# Patient Record
Sex: Female | Born: 1955 | Race: White | Hispanic: No | Smoking: Former smoker
Health system: Southern US, Community
[De-identification: ages and names within clinical notes are randomized; demographics above are authoritative.]

## PROBLEM LIST (undated history)

## (undated) DIAGNOSIS — M1990 Unspecified osteoarthritis, unspecified site: Secondary | ICD-10-CM

## (undated) DIAGNOSIS — Z720 Tobacco use: Secondary | ICD-10-CM

## (undated) DIAGNOSIS — T8859XA Other complications of anesthesia, initial encounter: Secondary | ICD-10-CM

## (undated) DIAGNOSIS — I10 Essential (primary) hypertension: Secondary | ICD-10-CM

## (undated) DIAGNOSIS — Z8489 Family history of other specified conditions: Secondary | ICD-10-CM

## (undated) DIAGNOSIS — R011 Cardiac murmur, unspecified: Secondary | ICD-10-CM

## (undated) DIAGNOSIS — R112 Nausea with vomiting, unspecified: Secondary | ICD-10-CM

## (undated) DIAGNOSIS — G4733 Obstructive sleep apnea (adult) (pediatric): Secondary | ICD-10-CM

## (undated) DIAGNOSIS — M549 Dorsalgia, unspecified: Secondary | ICD-10-CM

## (undated) DIAGNOSIS — Z85850 Personal history of malignant neoplasm of thyroid: Secondary | ICD-10-CM

## (undated) DIAGNOSIS — I272 Pulmonary hypertension, unspecified: Secondary | ICD-10-CM

## (undated) DIAGNOSIS — T4145XA Adverse effect of unspecified anesthetic, initial encounter: Secondary | ICD-10-CM

## (undated) DIAGNOSIS — Z9289 Personal history of other medical treatment: Secondary | ICD-10-CM

## (undated) DIAGNOSIS — G8929 Other chronic pain: Secondary | ICD-10-CM

## (undated) DIAGNOSIS — IMO0001 Reserved for inherently not codable concepts without codable children: Secondary | ICD-10-CM

## (undated) DIAGNOSIS — E669 Obesity, unspecified: Secondary | ICD-10-CM

## (undated) DIAGNOSIS — E782 Mixed hyperlipidemia: Secondary | ICD-10-CM

## (undated) DIAGNOSIS — E039 Hypothyroidism, unspecified: Secondary | ICD-10-CM

## (undated) DIAGNOSIS — C801 Malignant (primary) neoplasm, unspecified: Secondary | ICD-10-CM

## (undated) DIAGNOSIS — I2510 Atherosclerotic heart disease of native coronary artery without angina pectoris: Secondary | ICD-10-CM

## (undated) DIAGNOSIS — K219 Gastro-esophageal reflux disease without esophagitis: Secondary | ICD-10-CM

## (undated) DIAGNOSIS — Z9889 Other specified postprocedural states: Secondary | ICD-10-CM

## (undated) DIAGNOSIS — E785 Hyperlipidemia, unspecified: Secondary | ICD-10-CM

## (undated) HISTORY — DX: Personal history of malignant neoplasm of thyroid: Z85.850

## (undated) HISTORY — DX: Obesity, unspecified: E66.9

## (undated) HISTORY — DX: Essential (primary) hypertension: I10

## (undated) HISTORY — DX: Obstructive sleep apnea (adult) (pediatric): G47.33

## (undated) HISTORY — DX: Other chronic pain: G89.29

## (undated) HISTORY — DX: Atherosclerotic heart disease of native coronary artery without angina pectoris: I25.10

## (undated) HISTORY — DX: Dorsalgia, unspecified: M54.9

## (undated) HISTORY — DX: Hyperlipidemia, unspecified: E78.5

## (undated) HISTORY — DX: Tobacco use: Z72.0

## (undated) HISTORY — DX: Mixed hyperlipidemia: E78.2

## (undated) HISTORY — DX: Pulmonary hypertension, unspecified: I27.20

---

## 1997-11-02 HISTORY — PX: ABDOMINAL HYSTERECTOMY: SHX81

## 1998-11-02 HISTORY — PX: THYROIDECTOMY: SHX17

## 2010-11-02 DIAGNOSIS — I2510 Atherosclerotic heart disease of native coronary artery without angina pectoris: Secondary | ICD-10-CM

## 2010-11-02 HISTORY — DX: Atherosclerotic heart disease of native coronary artery without angina pectoris: I25.10

## 2010-11-24 ENCOUNTER — Ambulatory Visit
Admission: RE | Admit: 2010-11-24 | Discharge: 2010-11-24 | Payer: Self-pay | Source: Home / Self Care | Attending: Cardiovascular Disease | Admitting: Cardiovascular Disease

## 2010-11-24 NOTE — Letter (Signed)
November 24, 2010   Kathleen Rossetti, MD [address] [place], [place] [address]  RE:  ARNOLD, DEPINTO MRN:  [id number]  /  DOB:  07-20-1956  Dear Dr. Shary Decamp:  Thank you for referring Ms. Perras for further cardiac evaluation.  As you are aware, this is a pleasant 55 year old female with the following problem list: 1. Hypertension. 2. Hyperlipidemia. 3. Chronic back pain due to degenerative disk disease and herniated     disk. 4. History of thyroid cancer, status post thyroidectomy followed by     radiation therapy in 1998. 5. Obesity. 6. Family history of premature coronary artery disease. 7. Tobacco use. 8. Possible sleep apnea.  HISTORY OF PRESENT ILLNESS:  Ms. Freeburg is here today for evaluation of atypical chest pain and an abnormal ECG.  The patient had uncontrolled hypertension for at least 3-4 years.  Her blood pressure occasionally runs as high as 240 systolic.  For the most part, she is asymptomatic in that regard.  She is currently being treated with lisinopril.  She was recently started on hydrochlorothiazide, but she has not started taking the medication yet.  She is here today for evaluation of atypical chest pain.  She describes on and off symptoms of left-sided and substernal dull sensation, which lasts for a few minutes usually.  It does not have any triggering factors.  It happens mostly at rest with radiation to her neck.  It does not seem to be associated with activities.  She had one episode on Saturday, which lasted 10 minutes. She did not have any associated symptoms with it.  There was no dyspnea or nausea.  She has been trying to exercise on the treadmill about 8 minutes at a time, but she is limited somewhat due to her back discomfort.  She recently started paying more attention to healthier diet.  She had her labs checked and was found to have hyperlipidemia. She also complains of poor quality sleep, suggestive of sleep apnea. She has no previous  cardiac history.  She is not aware of a heart murmur.  There is no history of a previous stroke.  MEDICATIONS: 1. Synthroid 0.2 mg once daily. 2. Lisinopril 20 mg twice daily. 3. Lasix 40 mg as needed for edema and fluid overload. 4. Ibuprofen as needed.  ALLERGIES: 1. PENICILLIN. 2. PREDNISONE.  SOCIAL HISTORY:  Remarkable for smoking one-third of a pack.  She used to smoke 1 pack per day since she was in her 39s.  She drinks 1 glass of wine a few times a week.  She consumes a large amount of caffeinated beverages; 2-12 a day.  The patient is divorced and has four children. 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  PAST SURGICAL HISTORY: 1. Hysterectomy. 2. Thyroidectomy.  FAMILY HISTORY:  Her father had coronary artery bypass surgery in his 27s.  He had redo bypass surgery about 15 years later.  Her mother has diabetes.  There is a strong family history of hypertension as well.  REVIEW OF SYSTEMS:  This is remarkable for mild exertional dyspnea, occasional orthopnea, chest pain as described above, chronic back pain and arthritis discomfort.  Occasional heartburn.  A full review of systems was performed and was otherwise negative.  PHYSICAL EXAMINATION:  GENERAL:  The patient appears to be at her stated age and in no acute distress. Vital Signs:  Weight is 255 pounds, blood pressure is 181/91, pulse is 84, oxygen  saturation is 94% on room air. HEENT:  Normocephalic, atraumatic. NECK:  No JVD or carotid bruits. RESPIRATORY:  Normal respiratory effort with no use of accessory muscles.  Auscultation reveals normal breath sounds. CARDIOVASCULAR:  Normal PMI.  Normal S1-S2 with no gallops.  There is a 2/6 systolic ejection murmur at the aortic and pulmonary area. ABDOMEN:  Benign, nontender, nondistended. EXTREMITIES:  No clubbing, cyanosis or edema. SKIN:  Warm and dry with no rash. PSYCHIATRIC:  She is alert and oriented  x3 with normal mood and affect. MUSCULOSKELETAL:  There is normal muscle strength in the upper and lower extremities.  An electrocardiogram was performed, which showed normal sinus rhythm with evidence of left ventricular hypertrophy with significant repolarization abnormalities, especially in the lateral lead.  IMPRESSION: 1. Atypical chest pain with an abnormal ECG.  Although her ECG     abnormality could be due to hypertensive heart disease, an     underlying ischemic heart disease will need to be evaluated,     especially with her current symptoms of chest pain and multiple     risk factors for coronary artery disease.  Thus, I recommend     proceeding with a Lexiscan nuclear stress test for further     evaluation.  Due to her back pain, she is not able to fully get her     heart rate up on the treadmill. 2. Heart murmur:  This seems to be a flow murmur overall.     Nonetheless, her electrocardiogram shows significant left     ventricular hypertrophy that deserves to be evaluated.  Thus, I     will go ahead and obtain an echocardiogram. 3. Hypertension:  Her blood pressure is not controlled.  I had a     prolonged discussion with her about the importance of aggressive     control of her hypertension.  She seems to be making some lifestyle     changes in terms of her diet and exercise.  I also recommend     testing and treatment for possible underlying sleep apnea, which     might be contributing to this.  I agree with adding     hydrochlorothiazide and I advised her to start taking this     medication.  I suspect that ultimately she will require three     medications to control her hypertension. 4. Hyperlipidemia:  Her recent lipid profile showed a total     cholesterol of 231, triglycerides 136, HDL of 45, LDL of 159.  I     agree with aggressive lifestyle changes.  If her LDL remains above     130, I recommend initiating treatment with a statin, especially     with her risk  factors and family history of premature coronary     artery disease. 5. Tobacco use:  I discussed with her the importance of smoking     cessation.  Overall, there are really lots of opportunities in     terms of lifestyle changes in her situation, which was discussed     with her.  She will follow up with me after cardiac testing.  Thank you for allowing me to participate in the care of your patient.   Sincerely,    Lorine Bears, MD Electronically Signed  MA/MedQ  DD: 11/24/2010  DT: 11/24/2010  Job #: [id number]

## 2010-12-03 HISTORY — PX: CARDIAC CATHETERIZATION: SHX172

## 2010-12-04 ENCOUNTER — Telehealth (INDEPENDENT_AMBULATORY_CARE_PROVIDER_SITE_OTHER): Payer: Self-pay | Admitting: *Deleted

## 2010-12-08 ENCOUNTER — Ambulatory Visit (HOSPITAL_COMMUNITY): Payer: PRIVATE HEALTH INSURANCE | Attending: Cardiovascular Disease

## 2010-12-08 ENCOUNTER — Encounter: Payer: Self-pay | Admitting: Cardiology

## 2010-12-08 ENCOUNTER — Encounter: Payer: Self-pay | Admitting: Cardiovascular Disease

## 2010-12-08 ENCOUNTER — Ambulatory Visit (HOSPITAL_BASED_OUTPATIENT_CLINIC_OR_DEPARTMENT_OTHER): Payer: PRIVATE HEALTH INSURANCE

## 2010-12-08 DIAGNOSIS — R0989 Other specified symptoms and signs involving the circulatory and respiratory systems: Secondary | ICD-10-CM

## 2010-12-08 DIAGNOSIS — R011 Cardiac murmur, unspecified: Secondary | ICD-10-CM | POA: Insufficient documentation

## 2010-12-08 DIAGNOSIS — R9431 Abnormal electrocardiogram [ECG] [EKG]: Secondary | ICD-10-CM

## 2010-12-08 DIAGNOSIS — E785 Hyperlipidemia, unspecified: Secondary | ICD-10-CM | POA: Insufficient documentation

## 2010-12-08 DIAGNOSIS — F17200 Nicotine dependence, unspecified, uncomplicated: Secondary | ICD-10-CM | POA: Insufficient documentation

## 2010-12-08 DIAGNOSIS — R0609 Other forms of dyspnea: Secondary | ICD-10-CM

## 2010-12-08 DIAGNOSIS — R072 Precordial pain: Secondary | ICD-10-CM

## 2010-12-08 DIAGNOSIS — I10 Essential (primary) hypertension: Secondary | ICD-10-CM | POA: Insufficient documentation

## 2010-12-08 DIAGNOSIS — R079 Chest pain, unspecified: Secondary | ICD-10-CM

## 2010-12-08 DIAGNOSIS — Z8249 Family history of ischemic heart disease and other diseases of the circulatory system: Secondary | ICD-10-CM | POA: Insufficient documentation

## 2010-12-08 DIAGNOSIS — I079 Rheumatic tricuspid valve disease, unspecified: Secondary | ICD-10-CM | POA: Insufficient documentation

## 2010-12-09 ENCOUNTER — Telehealth (INDEPENDENT_AMBULATORY_CARE_PROVIDER_SITE_OTHER): Payer: Self-pay

## 2010-12-09 ENCOUNTER — Telehealth: Payer: Self-pay | Admitting: Cardiovascular Disease

## 2010-12-09 ENCOUNTER — Ambulatory Visit (HOSPITAL_COMMUNITY): Payer: PRIVATE HEALTH INSURANCE | Attending: Cardiovascular Disease

## 2010-12-10 NOTE — Progress Notes (Signed)
Summary: Nuclear Pre-Procedure  Phone Note Outgoing Call Call back at Work Phone [phone]   Call placed by: Stanton Kidney, EMT-P,  December 04, 2010 1:33 PM Action Taken: Phone Call Completed Summary of Call: Left message at work place for patient to call back (ref: stress test instructions.) Stanton Kidney, EMT-P  December 04, 2010 1:34 PM  Patient has not called back. Stanton Kidney, EMT-P  December 04, 2010 3:34 PM     Nuclear Med Background Indications for Stress Test: Evaluation for Ischemia, Abnormal EKG     Symptoms: Chest Pain    Nuclear Pre-Procedure Cardiac Risk Factors: Hypertension, Smoker

## 2010-12-15 ENCOUNTER — Ambulatory Visit (INDEPENDENT_AMBULATORY_CARE_PROVIDER_SITE_OTHER): Payer: PRIVATE HEALTH INSURANCE | Admitting: Cardiovascular Disease

## 2010-12-15 DIAGNOSIS — R079 Chest pain, unspecified: Secondary | ICD-10-CM

## 2010-12-15 DIAGNOSIS — I10 Essential (primary) hypertension: Secondary | ICD-10-CM

## 2010-12-15 DIAGNOSIS — R9430 Abnormal result of cardiovascular function study, unspecified: Secondary | ICD-10-CM

## 2010-12-15 DIAGNOSIS — E7800 Pure hypercholesterolemia, unspecified: Secondary | ICD-10-CM

## 2010-12-16 ENCOUNTER — Telehealth: Payer: Self-pay | Admitting: Cardiovascular Disease

## 2010-12-18 NOTE — Progress Notes (Signed)
Summary: Elevated BP readings  Phone Note Outgoing Call Call back at [phone]   Call placed by: Irean Hong, RN,  December 09, 2010 11:17 AM Summary of Call: Spoke with Victorino Dike, Dr. Jari Sportsman nurse regarding elevated BP readings.The patient is taking lisinopril and Hctz  which were new scipts that she started a couple weeks ago by PCP Dr. Shary Decamp.  The patient had taken lisinopril yesterday am prior to lexiscan test and BP 206/100. Today the patient here for rest images and BP 210/106 RA, BP206/110 LA big cuff, P. 60, but the patient didn't take lisinopril today.She is  asymptomatic. Dr. Kirke Corin called back and is going to add norvasc which he will call into the patient's pharmacy.Dr Kirke Corin spoke to the patient, and she is going to see him early next week for follow-up.Jamelah Sitzer,RN.

## 2010-12-18 NOTE — Progress Notes (Signed)
Summary: Rx called in   Phone Note Outgoing Call   Call placed by: Dessie Coma  LPN,  December 09, 2010 2:10 PM Call placed to: Patient Summary of Call: LMVM-notified patient per Dr. Kirke Corin, to have f/u with him next Mon. 12/15/10 at 9:30am.  Also to start on Amlodipine 10mg  once daily and Rx was called to [facility] in [place].

## 2010-12-18 NOTE — Assessment & Plan Note (Signed)
Summary: Cardiology Nuclear Testing  Nuclear Med Background Indications for Stress Test: Evaluation for Ischemia, Abnormal EKG   History: GXT  History Comments: GXt 3 yrs. ago: OK per patient.  Symptoms: Chest Pain, Chest Pain with Exertion, Dizziness, Light-Headedness, Nausea, Palpitations  Symptoms Comments: Radiates to neck. Last CP yesterday.   Nuclear Pre-Procedure Cardiac Risk Factors: Family History - CAD, Hypertension, Lipids, Smoker Caffeine/Decaff Intake: None NPO After: 6:00 AM Lungs: Clear IV 0.9% NS with Angio Cath: 20g     IV Site: L Antecubital IV Started by: Irean Hong, RN Chest Size (in) 50     Cup Size DD     Height (in): 68.5 Weight (lb): 255 BMI: 38.35 Tech Comments: Lisinopril/HCt took this am which was new scipt that she started a couple weeks ago by PCP Dr. Shary Decamp. BP check after 2nd images BP 206/100. The patient to follow-up with  Dr. Shary Decamp.Patsy Edwards,RN.  Nuclear Med Study 1 or 2 day study:  2 day     Stress Test Type:  Eugenie Birks Reading MD:  Olga Millers, MD     Referring MD:  Judie Petit. Arida Resting Radionuclide:  Technetium 70m Tetrofosmin     Resting Radionuclide Dose:  33.0 mCi  Stress Radionuclide:  Technetium 21m Tetrofosmin     Stress Radionuclide Dose:  33.0 mCi   Stress Protocol      Max HR:  105 bpm Max Systolic BP: 206 mm HgRate Pressure Product:  04540  Lexiscan: 0.4 mg   Stress Test Technologist:  Irean Hong,  RN     Nuclear Technologist:  Domenic Polite, CNMT  Rest Procedure  Myocardial perfusion imaging was performed at rest 45 minutes following the intravenous administration of Technetium 66m Tetrofosmin.  Stress Procedure  The patient received IV Lexiscan 0.4 mg over 15-seconds.  Technetium 37m Tetrofosmin injected at 30-seconds.  The EKG was nondiagnostic due to baseline changes.   Quantitative spect images were obtained after a 45 minute delay.  QPS Raw Data Images:  There is a breast shadow that accounts for the  anterior attenuation. Stress Images:  There is decreased uptake in the anterior wall and inferobasal wall. Rest Images:  There is decreased uptake in the anterior wall. Subtraction (SDS):  These findings are consistent with breast attenuation and very mild inferobasal ischemia. Transient Ischemic Dilatation:  1.01  (Normal <1.22)  Lung/Heart Ratio:  0.25  (Normal <0.45)  Quantitative Gated Spect Images QGS EDV:  124 ml QGS ESV:  58 ml QGS EF:  53 % QGS cine images:  Normal wall motion; mild LVE.   Overall Impression  Exercise Capacity: Lexiscan with no exercise. BP Response: Normal blood pressure response. Clinical Symptoms: No chest pain ECG Impression: Increased ST depression and T wave inversion in anterolateral leads with lexiscan infusion. Overall Impression: Abnormal lexiscan nuclear study with breast attenuation and very mild ischemia in the inferobasal wall.

## 2010-12-24 ENCOUNTER — Observation Stay (HOSPITAL_COMMUNITY)
Admission: RE | Admit: 2010-12-24 | Discharge: 2010-12-24 | Disposition: A | Discharge status: Home / Self Care | Payer: No Typology Code available for payment source | Source: Ambulatory Visit | Attending: Cardiovascular Disease | Admitting: Cardiovascular Disease

## 2010-12-24 DIAGNOSIS — R079 Chest pain, unspecified: Secondary | ICD-10-CM | POA: Insufficient documentation

## 2010-12-24 DIAGNOSIS — E785 Hyperlipidemia, unspecified: Secondary | ICD-10-CM | POA: Insufficient documentation

## 2010-12-24 DIAGNOSIS — R9431 Abnormal electrocardiogram [ECG] [EKG]: Secondary | ICD-10-CM | POA: Insufficient documentation

## 2010-12-24 DIAGNOSIS — F17200 Nicotine dependence, unspecified, uncomplicated: Secondary | ICD-10-CM | POA: Insufficient documentation

## 2010-12-24 DIAGNOSIS — I2510 Atherosclerotic heart disease of native coronary artery without angina pectoris: Secondary | ICD-10-CM

## 2010-12-24 LAB — CBC
HCT: 40.5 % (ref 36.0–46.0)
Hemoglobin: 13.9 g/dL (ref 12.0–15.0)
MCHC: 34.3 g/dL (ref 30.0–36.0)
RBC: 4.64 MIL/uL (ref 3.87–5.11)

## 2010-12-24 LAB — BASIC METABOLIC PANEL
BUN: 17 mg/dL (ref 6–23)
Chloride: 108 mEq/L (ref 96–112)
GFR calc non Af Amer: >60 mL/min (ref >60)
Potassium: 4.3 mEq/L (ref 3.5–5.1)
Sodium: 144 mEq/L (ref 135–145)

## 2010-12-24 LAB — PROTIME-INR
INR: 0.91 (ref 0.00–1.49)
Prothrombin Time: 12.5 seconds (ref 11.6–15.2)

## 2010-12-24 LAB — POCT ACTIVATED CLOTTING TIME: Activated Clotting Time: 169 seconds

## 2010-12-24 NOTE — Progress Notes (Signed)
Summary: heart pounding fast and hard  Phone Note Call from Patient   Caller: Patient Summary of Call: T.C. from patient-c/o episodes of heart pounding hard and fast.  Cell#[phone].  Initial call taken by: Dessie Coma  LPN,  December 16, 2010 11:24 AM

## 2010-12-29 ENCOUNTER — Telehealth: Payer: Self-pay | Admitting: Cardiovascular Disease

## 2011-01-01 ENCOUNTER — Encounter (INDEPENDENT_AMBULATORY_CARE_PROVIDER_SITE_OTHER): Payer: PRIVATE HEALTH INSURANCE | Admitting: Cardiovascular Disease

## 2011-01-01 DIAGNOSIS — E785 Hyperlipidemia, unspecified: Secondary | ICD-10-CM

## 2011-01-01 DIAGNOSIS — I2510 Atherosclerotic heart disease of native coronary artery without angina pectoris: Secondary | ICD-10-CM

## 2011-01-01 DIAGNOSIS — E7800 Pure hypercholesterolemia, unspecified: Secondary | ICD-10-CM

## 2011-01-01 DIAGNOSIS — I10 Essential (primary) hypertension: Secondary | ICD-10-CM

## 2011-01-02 ENCOUNTER — Ambulatory Visit: Payer: PRIVATE HEALTH INSURANCE | Admitting: Cardiovascular Disease

## 2011-01-02 NOTE — Assessment & Plan Note (Signed)
[facility]                       [place] [facility] OFFICE NOTE  NAME:FERGUSONAnnalese, Kathleen [name]                     MRN:          [id number] DATE:01/01/2011                            DOB:          [birth year]   Kathleen [name] is a 55 year old female who is here today for a followup visit.  She has the following problem list: 1. 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  It showed 60% mid-LAD     stenosis with an FFR ratio of 0.83, 30-40% disease in the left     circumflex as well as 20% in the right coronary artery.  Ejection     fraction was 60%.  Left ventricular end-diastolic pressure was     elevated at 25 mmHg. 2. Hypertension. 3. Hyperlipidemia. 4. Chronic back pain due to degenerative disk disease and herniated     disks. 5. History of thyroid cancer, status post thyroidectomy followed by     radiation therapy in 1990. 6. Obesity. 7. Previous tobacco use. 8. Possible sleep apnea.  INTERVAL HISTORY:  Kathleen [name] underwent cardiac catheterization with the results outlined above.  Given that stenosis in the LAD did not correlate with ischemia on her stress test, I elected to perform a pressure wire interrogation, which basically showed that the stenosis was not the physiologically significant to require angioplasty and stenting.  Thus, I decided to treat her medically.  I started her on Crestor 20 mg at bedtime.  Overall, she is feeling reasonably well.  She continues to have episodes of palpitations, which she describes as fast heartbeats.  These are random and sometimes can happen when she starts exercising.  The patient has not done much of exercise before.  She also complains of exertional dyspnea.  Chest pain has improved, but did not resolve completely.  MEDICATIONS: 1. Synthroid 200 mcg once daily. 2. Lisinopril 20 mg twice daily. 3. Amlodipine 10 mg once daily. 4.  Crestor 20 mg at bedtime. 5. Soma at bedtime. 6. Lasix 40 mg once daily as needed, but she has not been using this     frequently. 7. Ibuprofen as needed, but again this is not being used frequently.  PHYSICAL EXAMINATION:  VITAL SIGNS:  Weight is 253.8 pounds, blood pressure is 151/87, pulse is 66, oxygen saturation is 95% on room air. NECK:  Reveals no JVD or carotid bruits. LUNGS:  Clear to auscultation. HEART:  Regular rate and rhythm with no gallops.  There is a 2/6 systolic ejection murmur at the aortic area. ABDOMEN:  Benign, nontender, nondistended. EXTREMITIES:  With no clubbing, cyanosis or edema.  IMPRESSION: 1. Moderate nonobstructive coronary artery disease:  I recommend     intense medical therapy for her risk factors including hypertension     and hyperlipidemia.  She already quit smoking.  She will be treated     with blood pressure control as well as a statin.  We will also ask     her to take an 81 mg of aspirin once daily. 2. Hypertension:  Blood pressure is still elevated.  Her blood  pressure was severely elevated during cardiac catheterization with     moderately elevated left ventricular end-diastolic pressure.  I     think she would benefit from a diuretic to tackle both problems.     Thus, I will make some changes to her medications.  I will stop     lisinopril and add hydrochlorothiazide 25 mg once daily.  Given     that she is complaining of significant palpitations which partially     I think is related to treatment with Synthroid that is being used     to suppress her thyroid cells due to her previous thyroid cancer, I     will go ahead and start her on carvedilol 6.25 mg twice daily.  I     asked her to continue to monitor her blood pressure at home.  We     will continue with low-sodium diet. 3. Hyperlipidemia:  Due to her coronary artery disease, I started her     on Crestor 20 mg at bedtime.  She will have a lipid profile checked     in 2 months.   The patient will follow up in 2 months from now to     see if further adjustment of her medications is needed.    Lorine Bears, MD Electronically Signed   MA/MedQ  DD: 01/01/2011  DT: 01/02/2011  Job #: [id number]

## 2011-01-05 ENCOUNTER — Ambulatory Visit: Payer: PRIVATE HEALTH INSURANCE | Admitting: Cardiovascular Disease

## 2011-01-08 NOTE — Progress Notes (Signed)
  Phone Note Call from Patient   Caller: Patient Summary of Call: T.C. from patient-c/o feeling weak and not feeling well sp cath last Wed.   To come in to see Dr. Kirke Corin on 01/01/11 at 12:45pm.  To call if sxs get worse. Initial call taken by: Dessie Coma  LPN,  December 29, 2010 11:01 AM

## 2011-01-08 NOTE — Progress Notes (Signed)
Summary: pt feeling like she will pass out with chest pains  Phone Note Call from Patient Call back at [phone]   Caller: Patient Summary of Call: Pt had cath last Wed and when pt walks she feels like she is going to pass out and the pt have been having chest pains lasting a few seconds Initial call taken by: Judie Grieve,  December 29, 2010 10:21 AM  Follow-up for Phone Call        Phone Call Completed-to see Dr. Kirke Corin on 01/01/11 at 12:45pm Follow-up by: Dessie Coma  LPN,  December 29, 2010 11:02 AM

## 2011-01-09 ENCOUNTER — Encounter: Payer: Self-pay | Admitting: Cardiovascular Disease

## 2011-01-23 NOTE — Assessment & Plan Note (Signed)
[facility]                       [place] CARDIOLOG[facility] OFFICE NOTE  NAME:Kathleen [name]                     MRN:          [id number] DATE:12/15/2010                            DOB:          18-Apr-1956   [name] is a 55 year old female who is here today for a followup visit.  She has the following problem list: 1. Refractory hypertension. 2. Hyperlipidemia. 3. Chronic back pain due to degenerative disk disease and herniated     disk. 4. History of thyroid cancer status post thyroidectomy, followed by     radiation therapy in 1990. 5. Obesity. 6. Family history of premature coronary artery disease. 7. Tobacco use, quit recently. 8. Possible sleep apnea.  INTERIM HISTORY:  Ms. Pharris was evaluated for chest pain and abnormal ECG during her last visit.  She was found to have significantly elevated blood pressure which is chronic for her.  At that time, she was already started on hydrochlorothiazide but has not started taking the medication.  It seems that she did not get the medication at all and continued on lisinopril twice daily.  I requested a Lexiscan nuclear stress test.  On the day of the stress test, her blood pressure was more than 200.  At that time, I started her on amlodipine at 10 mg once daily.  She had gradual improvement in her blood pressure since then. She did have a prolonged episode of chest pain on Saturday which was described as a burning sensation substernally with no radiation.  It happened at rest and was associated with nausea.  It was triggered by some anxiety.  She continues to have exertional dyspnea which according to the patient actually has somewhat improved since she quit smoking. She quit smoking 3 weeks ago.  MEDICATIONS: 1. Synthroid 0.2 mg once daily. 2. Lisinopril 20 mg twice daily. 3. Lasix 40 mg once daily as needed. 4. Amlodipine 10 mg once daily. 5. Aspirin 81 mg once daily will be added  today.  ALLERGIES:  PENICILLIN and PREDNISONE.  PHYSICAL EXAMINATION:  GENERAL:  The patient appears to be at her stated age and in no acute distress. VITAL SIGNS:  Weight is 255.4 pounds, blood pressure 167/83, pulse is 68, oxygen saturation is 96% on room air. HEENT:  Normocephalic, atraumatic. NECK:  No JVD or carotid bruits. RESPIRATORY:  Normal respiratory effort with no use of accessory muscles.  Auscultation reveals normal breath sounds. CARDIOVASCULAR:  Normal PMI.  Normal S1 and S2 with no gallops.  There is a 2/6 systolic ejection murmur at the base of the heart. ABDOMEN:  Benign, nontender, nondistended. EXTREMITIES:  With no clubbing, cyanosis, or edema. SKIN:  Warm and dry with no rash. PSYCHIATRIC:  She is alert, oriented x3 with normal mood and affect. MUSCULOSKELETAL:  There is normal muscle strength in the upper and lower extremities.  IMPRESSION: 1. Continued chest pain with abnormal stress test:  Her Lexiscan     nuclear stress test showed evidence of mild ischemia in the     inferobasal wall with an ejection fraction of 53%.  She did have a     prolonged episode of  chest pain on Saturday.  Due to her continued     symptoms and abnormal stress test, I recommend proceeding with     cardiac catheterization and possible coronary intervention.  Risks,     benefits, and alternatives were discussed with the patient.  In the     meanwhile, we will start aspirin 81 mg once daily. 2. Refractory hypertension:  I plan on performing an abdominal     aortogram at the time of cardiac catheterization to evaluate for     renal artery stenosis.  Other possibilities include untreated sleep     apnea which might be contributing.  Her blood pressure is better at     this time.  She did have dizziness a day after she started     amlodipine with a blood pressure of 110 systolic.  Thus, we will     not add anything at this time.  We will likely add a beta-blocker     after cardiac  catheterization depending on the results. 3. Hyperlipidemia:  Her LDL was 159 with an HDL of 145.  Most likely,     we will start her on a statin.  We will decide on the agent once     her cardiac catheterization is performed to know how aggressive we     need to be. 4. Heart murmur, seems to be a flow murmur.  Echocardiogram showed     normal left ventricular systolic function with moderate left     ventricular hypertrophy and elevated filling pressures.  There were     no significant valvular abnormalities.  The patient will follow up     in 1 week after cardiac catheterization.    Lorine Bears, MD Electronically Signed   MA/MedQ  DD: 12/15/2010  DT: 12/16/2010  Job #: [id number]

## 2011-01-29 NOTE — Procedures (Signed)
Kathleen [name], Kathleen [name]              ACCOUNT NO.:  [id number]  MEDICAL RECORD NO.:  [id number]           PATIENT TYPE:  I  LOCATION:  6522                         FACILITY:  [facility]  PHYSICIAN:  Lorine Bears, MD     DATE OF BIRTH:  1956-01-27  DATE OF PROCEDURE:  12/24/2010 DATE OF DISCHARGE:  12/24/2010                           CARDIAC CATHETERIZATION   PROCEDURES PERFORMED: 1. Left heart catheterization. 2. Coronary angiography. 3. Left ventricular angiography. 4. Pressure wire interrogation of the left anterior descending artery.  INDICATION AND CLINICAL HISTORY:  Kathleen [name] is a 55 year old female with no previous cardiac history.  She has past medical history of refractory hypertension, hyperlipidemia, as well as tobacco use.  Today she presented to the Outpatient Clinic for evaluation of chest pain and an abnormal ECG.  She underwent a stress test, which showed evidence of basal inferior ischemia.  Due to her continued symptoms and abnormal stress test, cardiac catheterization and possible coronary intervention were recommended.  Risks, benefits, and alternatives were discussed with the patient.  ACCESS:  Right radial artery with a 5-French sheath.  STUDY DETAILS:  A standard informed consent was obtained.  She was given fentanyl and Versed for sedation.  The right radial area was prepped in a sterile fashion.  It was anesthetized with 1% lidocaine.  A 5-French sheath was then placed in the right radial artery.  3 mg of verapamil was given through the sheath.  5000 units of unfractionated heparin was given intravenously.  There was significant tortuosity in the innominate artery leading into the subclavian as well as right carotid artery.  I was able to navigate through that with a Wholey wire.  Coronary angiography was performed with a JL-3.5, AL-1 catheter to image the right coronary artery as well as a pigtail catheter.  The right Judkins catheter did not intubate  the right coronary artery.  All catheter exchanges were done over the wire.  Left ventricular angiography was performed in the RAO 30 position.  Due to a borderline significant mid LAD stenosis, I proceeded with a pressure wire interrogation.  I continued with a 5-French sheath.  IV bivalirudin was initiated with therapeutic ACT.  I then used the volcano pressure wire in a standard fashion.  She was given intravenous adenosine at a dose of 140 mcg/kg per minute.  Resting gradient as well as a stress-induced gradient were both recorded.  The FFR ratio was 0.83 indicating no physiologic significance.  The guiding catheter and wire were removed.  The sheath was then removed and TR band was applied.  The patient tolerated the procedure well with no immediate complications.  STUDY FINDINGS:  Hemodynamic findings:  Left ventricular pressure was 185/16 with a left ventricular end-diastolic pressure of 25 mmHg. Aortic pressure was 188/95 with mean pressure of 134 mmHg.  CORONARY ANGIOGRAPHY:  Left main coronary artery:  The vessel was normal in size and free of significant disease.  Left anterior descending artery:  The vessel was normal in size with mild atherosclerosis.  There was a 20% mid LAD stenosis right after the first diagonal.  There was another lesion right at  the second diagonal, which was estimated to be 60% discrete lesion.  The third diagonal was very small in size.  The rest of the LAD was free of significant disease.  First diagonal was normal-sized vessel and free of significant disease.  Second diagonal was normal sized with no ostial disease. Third diagonal was very small in size.  Left circumflex artery:  The vessel was normal in size and nondominant. There was a 30% ostial stenosis.  There was diffuse disease in the mid segment.  There was also a 40% tubular stenosis in the proximal OM-3. OM-1 was a small-sized branch.  OM-2 and OM-3 were normal-sized branches.  Right  coronary artery:  The vessel was large sized and dominant.  It has mild diffuse atherosclerosis.  There was a 20% discrete proximal stenosis.  The rest of the vessel was free of significant disease.  Left ventricular angiography:  This showed normal LV systolic function with an ejection fraction of 60%.  CONCLUSIONS: 1. Moderate nonobstructive mid left anterior descending stenosis with     a correlating FFR ratio of 0.83.  No evidence of any other     obstructive disease.  There is mild disease in the left circumflex     as well as right coronary artery. 2. Severe systemic hypertension with moderately elevated left     ventricular end-diastolic pressure. 3. Normal left ventricular systolic function.  RECOMMENDATIONS:  I recommend aggressive treatment of risk factors including hypertension and hyperlipidemia:  We will initiate a statin.  We will consider adding a small dose diuretic if her blood pressure remains elevated.     Lorine Bears, MD     MA/MEDQ  D:  12/24/2010  T:  12/24/2010  Job:  [id number]  Electronically Signed by Lorine Bears MD on 01/29/2011 09:06:31 AM

## 2011-04-02 ENCOUNTER — Ambulatory Visit: Payer: PRIVATE HEALTH INSURANCE | Admitting: Cardiovascular Disease

## 2011-04-28 ENCOUNTER — Telehealth: Payer: Self-pay | Admitting: *Deleted

## 2011-04-28 NOTE — Telephone Encounter (Signed)
Pt calling from [facility] stating having short episodes CP--states lasts only seconds, grabbing pain --states discussed this with dr Kirke Corin at last o.v. And he suggested some NTG which pt refused at time--i advised pt to go to nearest ED--pt refused, but wanted to try NTG--called in NTG 0.4mg  s.l. Put 1 tab under tongue( no more than 3 tabs in15 min) for chest pain  #25 with 1 refill to [facility]--pt states she has appoint next Monday with dr Kirke Corin and again refuses to go to nearest ED or come to [facility]--again advised if has to take NTG pt should go to nearest ED--AGAIN PT REFUSED--will forward message to dr arida--nt

## 2011-05-04 ENCOUNTER — Ambulatory Visit (INDEPENDENT_AMBULATORY_CARE_PROVIDER_SITE_OTHER): Payer: PRIVATE HEALTH INSURANCE | Admitting: Cardiovascular Disease

## 2011-05-04 ENCOUNTER — Encounter: Payer: Self-pay | Admitting: Cardiovascular Disease

## 2011-05-04 DIAGNOSIS — I2510 Atherosclerotic heart disease of native coronary artery without angina pectoris: Secondary | ICD-10-CM | POA: Insufficient documentation

## 2011-05-04 DIAGNOSIS — Z720 Tobacco use: Secondary | ICD-10-CM | POA: Insufficient documentation

## 2011-05-04 DIAGNOSIS — E785 Hyperlipidemia, unspecified: Secondary | ICD-10-CM

## 2011-05-04 DIAGNOSIS — I10 Essential (primary) hypertension: Secondary | ICD-10-CM

## 2011-05-04 DIAGNOSIS — E782 Mixed hyperlipidemia: Secondary | ICD-10-CM | POA: Insufficient documentation

## 2011-05-04 NOTE — Assessment & Plan Note (Signed)
The patient had recent atypical chest pain which does not seem to be cardiac in nature. It resolved on its own unlikely was GI in nature. We gave her a prescription for nitroglycerin sublingual to be used as needed and instructed her on the proper use. In the meantime, continue aggressive medical therapy of risk factors.

## 2011-05-04 NOTE — Progress Notes (Signed)
HPI  This is a 55 year old female who is here today for a followup visit. She has known history of coronary artery disease with moderate mid LAD stenosis detected on cardiac catheterization in February of 2012. FFR ratio was 0.83. The patient also has hypertension with hypertensive heart disease. During her last visit I started her on carvedilol as well as hydrochlorothiazide. The patient did well. However, last week she had recurrent episodes of brief substernal chest discomfort which happen at rest and each episode lasted for a few seconds. The patient did not have nitroglycerin with her. This was called in that her symptoms actually resolved on its own. She thought it was due to gas. She does have occasional heartburn and uses omeprazole as needed. She feels back to her normal self.  Allergies  Allergen Reactions  . Lorabid (Loracarbef)   . Penicillins     REACTION: Syncope  . Prednisone     REACTION: Rash     Current Outpatient Prescriptions on File Prior to Visit  Medication Sig Dispense Refill  . amLODipine (NORVASC) 10 MG tablet Take 10 mg by mouth daily.        Marland Kitchen aspirin 81 MG tablet Take 81 mg by mouth daily.        . carisoprodol (SOMA) 350 MG tablet Take 350 mg by mouth 4 (four) times daily as needed.        . furosemide (LASIX) 40 MG tablet Take 40 mg by mouth as needed.        Marland Kitchen ibuprofen (ADVIL,MOTRIN) 200 MG tablet Take 200 mg by mouth every 6 (six) hours as needed.        Marland Kitchen levothyroxine (SYNTHROID, LEVOTHROID) 200 MCG tablet Take 200 mcg by mouth daily.        . rosuvastatin (CRESTOR) 20 MG tablet Take 20 mg by mouth daily.        Marland Kitchen DISCONTD: lisinopril (PRINIVIL,ZESTRIL) 20 MG tablet Take 20 mg by mouth 2 (two) times daily.           Past Medical History  Diagnosis Date  . Chronic back pain   . Hx of thyroid cancer   . Obesity   . Sleep apnea, obstructive     possible  . Tobacco abuse   . Coronary artery disease 2012    Mild to Moderate  . Hyperlipidemia   .  Hypertension      Past Surgical History  Procedure Date  . Thyroidectomy 1999  . Abdominal hysterectomy 1999  . Cardiac catheterization Feb 2012    LAD: 60% mid (FFR ratio 0.83), LCX: 30-40%, RCA 20%.      Family History  Problem Relation Age of Onset  . Diabetes Mother      History   Social History  . Marital Status: Divorced    Spouse Name: N/A    Number of Children: N/A  . Years of Education: N/A   Occupational History  . Not on file.   Social History Main Topics  . Smoking status: Former Smoker -- 0.2 packs/day for 3 years    Types: Cigarettes  . Smokeless tobacco: Not on file  . Alcohol Use: 1.8 oz/week    3 Glasses of wine per week  . Drug Use: Not on file  . Sexually Active: Not on file   Other Topics Concern  . Not on file   Social History Narrative  . No narrative on file       PHYSICAL EXAM   BP 124/78  Pulse 60  Ht 5\' 8"  (1.727 m)  Wt 256 lb (116.121 kg)  BMI 38.92 kg/m2  SpO2 97%  Constitutional: She is oriented to person, place, and time. She appears well-developed and well-nourished. No distress.  HENT: No nasal discharge.  Head: Normocephalic and atraumatic.  Eyes: Pupils are equal, round, and reactive to light. Right eye exhibits no discharge. Left eye exhibits no discharge.  Neck: Normal range of motion. Neck supple. No JVD present. No thyromegaly present.  Cardiovascular: Normal rate, regular rhythm, normal heart sounds and intact distal pulses. Exam reveals no gallop and no friction rub.  There is a 1/6 systolic ejection murmur in the aortic area. Pulmonary/Chest: Effort normal and breath sounds normal. No stridor. No respiratory distress. She has no wheezes. She has no rales. She exhibits no tenderness.  Abdominal: Soft. Bowel sounds are normal. She exhibits no distension. There is no tenderness. There is no rebound and no guarding.  Musculoskeletal: Normal range of motion. She exhibits no edema and no tenderness.  Neurological:  She is alert and oriented to person, place, and time. Coordination normal.  Skin: Skin is warm and dry. No rash noted. She is not diaphoretic. No erythema. No pallor.  Psychiatric: She has a normal mood and affect. Her behavior is normal. Judgment and thought content normal.    EKG: Sinus bradycardia with a heart rate of 50 beats per minute. There is left ventricular hypertrophy with repolarization abnormality. This is really not different from her previous ECG.   ASSESSMENT AND PLAN

## 2011-05-04 NOTE — Assessment & Plan Note (Signed)
Her blood pressure is now very well controlled. Continue current medications. Carvedilol helped significantly with her palpitations. Check basic metabolic profile.

## 2011-05-04 NOTE — Patient Instructions (Signed)
Your physician recommends that you schedule a follow-up appointment in: 6 months  Your physician recommends that you return for lab work at your office -- Lipid & CMP

## 2011-05-04 NOTE — Assessment & Plan Note (Signed)
The patient is taking Crestor 20 mg once daily. Will request a fasting lipid and liver profile.

## 2011-06-22 ENCOUNTER — Other Ambulatory Visit: Payer: Self-pay | Admitting: *Deleted

## 2011-06-22 MED ORDER — CARVEDILOL 6.25 MG PO TABS: 6.2500 mg | ORAL_TABLET | Freq: Two times a day (BID) | ORAL | Status: DC

## 2011-07-16 NOTE — Discharge Summary (Addendum)
  NAMEEXIE, CHRISMER NO.:  [id number]  MEDICAL RECORD NO.:  [id number]  LOCATION:  6522                         FACILITY:  [facility]  PHYSICIAN:  Lorine Bears, MD     DATE OF BIRTH:  1955-11-24  DATE OF ADMISSION:  12/24/2010 DATE OF DISCHARGE:  12/24/2010                              DISCHARGE SUMMARY   DISCHARGE DIAGNOSIS:  Chest pain.  SECONDARY DIAGNOSES: 1. Nonobstructive coronary artery disease by catheterization on     December 24, 2010. 2. Hypertension. 3. Hyperlipidemia. 4. Tobacco abuse. 5. Chronic back pain. 6. History of thyroid cancer status post thyroidectomy followed by     radiation therapy in 1990. 7. Obesity. 8. Possible sleep apnea.  ALLERGIES:  PENICILLIN, LORABID, PREDNISONE.  PROCEDURE:  Left heart cardiac catheterization on December 24, 2010, revealing diffuse three-vessel coronary artery disease, the worst of which involved a 60% mid LAD stenosis with a fractional flow reserve of 0.83.  LVEDP was elevated at 25.  The patient was medically managed.  HISTORY OF PRESENT ILLNESS:  A 55 year old female with the above problem list who was seen in clinic by Dr. Kirke Corin on November 24, 2010, for chest pain and subsequently underwent stress testing which showed mild inferobasal ischemia with an EF of 53%.  Decision was made to pursue catheterization.  HOSPITAL COURSE:  The patient presented to the [facility] through the Outpatient Cardiac Cath Lab on December 24, 2010, and underwent diagnostic catheterization, revealing moderate nonobstructive CAD. There was a 60% stenosis in her LAD and fractional flow reserve was performed in this area within a normal FFR of 0.83.  Therefore, continued medical therapy was recommended.  The patient was discharged home following bedrest postcatheterization.  DISCHARGE LABS:  None.  DISPOSITION:  The patient was discharged home on the same day following a scheduled outpatient procedure.  FOLLOWUP  PLANS AND APPOINTMENTS:  The patient has followup scheduled with Dr. Kirke Corin on January 01, 2011.  DISCHARGE MEDICATIONS: 1. Lasix 40 mg daily p.r.n. 2. Soma 350 mg daily p.r.n. 3. Ibuprofen 800 mg q.8 hours p.r.n. 4. Synthroid 200 mcg daily. 5. Amlodipine 10 mg daily. 6. HCTZ 25 mg daily. 7. Lisinopril 20 mg b.i.d. 8. Crestor 20 mg at bedtime.  OUTSTANDING LAB STUDIES:  None.  DURATION OF DISCHARGE ENCOUNTER:  5 minutes including physician time.     Nicolasa Ducking, ANP   ______________________________ Lorine Bears, MD    CB/MEDQ  D:  06/25/2011  T:  06/25/2011  Job:  [id number]  Electronically Signed by Nicolasa Ducking ANP on 07/16/2011 03:13:12 PM Electronically Signed by Lorine Bears MD on 07/21/2011 11:22:35 AM

## 2011-09-09 ENCOUNTER — Encounter: Payer: Self-pay | Admitting: Cardiovascular Disease

## 2011-09-28 ENCOUNTER — Other Ambulatory Visit: Payer: Self-pay | Admitting: Cardiovascular Disease

## 2011-10-13 NOTE — Progress Notes (Signed)
Patient ID: Kathleen [name], female   DOB: 1956-01-02, 55 y.o.   MRN: [id number]

## 2012-07-13 ENCOUNTER — Ambulatory Visit: Payer: BC Managed Care – PPO | Admitting: Cardiovascular Disease

## 2012-08-03 ENCOUNTER — Ambulatory Visit (INDEPENDENT_AMBULATORY_CARE_PROVIDER_SITE_OTHER): Payer: BC Managed Care – PPO | Admitting: Cardiovascular Disease

## 2012-08-03 ENCOUNTER — Encounter: Payer: Self-pay | Admitting: Cardiovascular Disease

## 2012-08-03 VITALS — BP 139/86 | HR 62 | Ht 68.0 in | Wt 263.0 lb

## 2012-08-03 DIAGNOSIS — R0989 Other specified symptoms and signs involving the circulatory and respiratory systems: Secondary | ICD-10-CM

## 2012-08-03 DIAGNOSIS — R002 Palpitations: Secondary | ICD-10-CM

## 2012-08-03 DIAGNOSIS — E785 Hyperlipidemia, unspecified: Secondary | ICD-10-CM

## 2012-08-03 DIAGNOSIS — R011 Cardiac murmur, unspecified: Secondary | ICD-10-CM

## 2012-08-03 DIAGNOSIS — R0600 Dyspnea, unspecified: Secondary | ICD-10-CM

## 2012-08-03 DIAGNOSIS — R0609 Other forms of dyspnea: Secondary | ICD-10-CM

## 2012-08-03 DIAGNOSIS — I2510 Atherosclerotic heart disease of native coronary artery without angina pectoris: Secondary | ICD-10-CM

## 2012-08-03 HISTORY — DX: Palpitations: R00.2

## 2012-08-03 HISTORY — DX: Cardiac murmur, unspecified: R01.1

## 2012-08-03 MED ORDER — PRAVASTATIN SODIUM 40 MG PO TABS: 40.0000 mg | ORAL_TABLET | Freq: Every evening | ORAL | Status: DC

## 2012-08-03 NOTE — Assessment & Plan Note (Signed)
Her palpitations are suggestive of premature beats. However, she is having associated dizziness. I will obtain a 48-hour Holter monitor. I advised her to cut down on caffeine intake.

## 2012-08-03 NOTE — Assessment & Plan Note (Signed)
She had mild myalgia with Crestor and severe myalgia with simvastatin. I will try pravastatin 40 mg daily. I advised her to get a followup lipid and liver profile with Dr. Shary Decamp in [place].

## 2012-08-03 NOTE — Assessment & Plan Note (Signed)
She seems to be stable from CAD standpoint. No symptoms suggestive of angina. She gets occasional sharp chest discomfort which does not seem to be cardiac. Continue medical therapy.

## 2012-08-03 NOTE — Progress Notes (Signed)
HPI  This is a 56 year old female who is here today for a followup visit. She was last seen in July of 2012. She has known history of coronary artery disease with moderate mid LAD stenosis detected on cardiac catheterization in February of 2012. FFR ratio was 0.83. The patient also has hypertension with hypertensive heart disease. Since last visit, the patient quit smoking but she gained about 8 pounds. She gets rare episodes of brief chest discomfort. She does complain of exertional dyspnea. Biggest issue at this time seems to be frequent palpitations happening almost on a daily basis. She feels skipping in her heart associated with dizziness. This happens more frequently when she is under stress or after drinking caffeinated products. She does not exercise on a regular basis. She does have hyperlipidemia. Previously she was on Crestor but was not covered by her insurance. It appears that simvastatin caused significant myalgia. Recent cholesterol according to her was 220.  Allergies  Allergen Reactions  . Lorabid (Loracarbef)   . Penicillins     REACTION: Syncope  . Prednisone     REACTION: Rash     Current Outpatient Prescriptions on File Prior to Visit  Medication Sig Dispense Refill  . amLODipine (NORVASC) 10 MG tablet TAKE 1 TABLET BY MOUTH ONCE DAILY  30 tablet  5  . aspirin 81 MG tablet Take 81 mg by mouth daily.        . carisoprodol (SOMA) 350 MG tablet Take 350 mg by mouth 4 (four) times daily as needed.        . carvedilol (COREG) 6.25 MG tablet TAKE 1 TABLET BY MOUTH TWICE DAILY  60 tablet  5  . furosemide (LASIX) 40 MG tablet Take 40 mg by mouth as needed.        . hydrochlorothiazide 25 MG tablet Take 1 tablet by mouth daily.      Marland Kitchen ibuprofen (ADVIL,MOTRIN) 200 MG tablet Take 200 mg by mouth every 6 (six) hours as needed.        Marland Kitchen levothyroxine (SYNTHROID, LEVOTHROID) 200 MCG tablet Take 200 mcg by mouth daily.        Marland Kitchen NITROSTAT 0.4 MG SL tablet Take 1 tablet by mouth Every 5  minutes as needed.      . pravastatin (PRAVACHOL) 40 MG tablet Take 1 tablet (40 mg total) by mouth every evening.  30 tablet  3     Past Medical History  Diagnosis Date  . Chronic back pain   . Hx of thyroid cancer   . Obesity   . Sleep apnea, obstructive     possible  . Tobacco abuse   . Coronary artery disease 2012    Mild to Moderate  . Hyperlipidemia   . Hypertension      Past Surgical History  Procedure Date  . Thyroidectomy 1999  . Abdominal hysterectomy 1999  . Cardiac catheterization Feb 2012    LAD: 60% mid (FFR ratio 0.83), LCX: 30-40%, RCA 20%.      Family History  Problem Relation Age of Onset  . Diabetes Mother      History   Social History  . Marital Status: Divorced    Spouse Name: N/A    Number of Children: N/A  . Years of Education: N/A   Occupational History  . Not on file.   Social History Main Topics  . Smoking status: Former Smoker -- 0.2 packs/day for 3 years    Types: Cigarettes  . Smokeless tobacco: Not on file  .  Alcohol Use: 1.8 oz/week    3 Glasses of wine per week  . Drug Use: Not on file  . Sexually Active: Not on file   Other Topics Concern  . Not on file   Social History Narrative  . No narrative on file       PHYSICAL EXAM   BP 139/86  Pulse 62  Ht 5\' 8"  (1.727 m)  Wt 263 lb (119.296 kg)  BMI 39.99 kg/m2  Constitutional: She is oriented to person, place, and time. She appears well-developed and well-nourished. No distress.  HENT: No nasal discharge.  Head: Normocephalic and atraumatic.  Eyes: Pupils are equal, round, and reactive to light. Right eye exhibits no discharge. Left eye exhibits no discharge.  Neck: Normal range of motion. Neck supple. No JVD present. No thyromegaly present.  Cardiovascular: Normal rate, regular rhythm, normal heart sounds and intact distal pulses. Exam reveals no gallop and no friction rub.  There is a 2/6 systolic ejection murmur in the aortic area. Pulmonary/Chest: Effort  normal and breath sounds normal. No stridor. No respiratory distress. She has no wheezes. She has no rales. She exhibits no tenderness.  Abdominal: Soft. Bowel sounds are normal. She exhibits no distension. There is no tenderness. There is no rebound and no guarding.  Musculoskeletal: Normal range of motion. She exhibits no edema and no tenderness.  Neurological: She is alert and oriented to person, place, and time. Coordination normal.  Skin: Skin is warm and dry. No rash noted. She is not diaphoretic. No erythema. No pallor.  Psychiatric: She has a normal mood and affect. Her behavior is normal. Judgment and thought content normal.    EKG: Sinus rhythm with left ventricular hypertrophy with repolarization abnormality.  ASSESSMENT AND PLAN

## 2012-08-03 NOTE — Patient Instructions (Addendum)
Your physician has recommended that you wear a 48 hr holter monitor. Holter monitors are medical devices that record the heart's electrical activity. Doctors most often use these monitors to diagnose arrhythmias. Arrhythmias are problems with the speed or rhythm of the heartbeat. The monitor is a small, portable device. You can wear one while you do your normal daily activities. This is usually used to diagnose what is causing palpitations/syncope (passing out).   Your physician has requested that you have an echocardiogram. Echocardiography is a painless test that uses sound waves to create images of your heart. It provides your doctor with information about the size and shape of your heart and how well your heart's chambers and valves are working. This procedure takes approximately one hour. There are no restrictions for this procedure.  Your physician recommends that you schedule a follow-up appointment in: 6 months [place] office  Your physician has recommended you make the following change in your medication:   Start pravachol 40 mg daily, get your labs checked with pcp

## 2012-08-03 NOTE — Assessment & Plan Note (Signed)
She has a cardiac murmur suggestive of aortic sclerosis. However, given increased symptoms of dyspnea, I will obtain an echocardiogram.

## 2012-08-10 ENCOUNTER — Ambulatory Visit: Payer: BC Managed Care – PPO | Admitting: Cardiovascular Disease

## 2012-08-15 ENCOUNTER — Encounter (INDEPENDENT_AMBULATORY_CARE_PROVIDER_SITE_OTHER): Payer: BC Managed Care – PPO

## 2012-08-15 ENCOUNTER — Ambulatory Visit (HOSPITAL_COMMUNITY): Payer: BC Managed Care – PPO | Attending: Internal Medicine | Admitting: Radiology

## 2012-08-15 DIAGNOSIS — E785 Hyperlipidemia, unspecified: Secondary | ICD-10-CM

## 2012-08-15 DIAGNOSIS — R0600 Dyspnea, unspecified: Secondary | ICD-10-CM

## 2012-08-15 DIAGNOSIS — I2510 Atherosclerotic heart disease of native coronary artery without angina pectoris: Secondary | ICD-10-CM | POA: Insufficient documentation

## 2012-08-15 DIAGNOSIS — R011 Cardiac murmur, unspecified: Secondary | ICD-10-CM

## 2012-08-15 DIAGNOSIS — I059 Rheumatic mitral valve disease, unspecified: Secondary | ICD-10-CM | POA: Insufficient documentation

## 2012-08-15 DIAGNOSIS — R002 Palpitations: Secondary | ICD-10-CM

## 2012-08-15 NOTE — Progress Notes (Signed)
Echocardiogram performed.  

## 2012-08-18 ENCOUNTER — Telehealth: Payer: Self-pay | Admitting: *Deleted

## 2012-08-18 NOTE — Telephone Encounter (Signed)
Pt was notified.  

## 2012-08-18 NOTE — Telephone Encounter (Signed)
Message copied by Antony Odea on Thu Aug 18, 2012 12:28 PM ------      Message from: Kathleen [name]      Created: Wed Aug 17, 2012  5:24 PM       Inform patient that echo was fine. The murmur is not serious. Holter monitor showed rare PACs and PVCs.

## 2012-08-18 NOTE — Telephone Encounter (Signed)
Will route to nurse °

## 2013-03-16 ENCOUNTER — Ambulatory Visit: Payer: BC Managed Care – PPO | Admitting: Cardiovascular Disease

## 2013-04-06 ENCOUNTER — Ambulatory Visit: Payer: BC Managed Care – PPO | Admitting: Cardiovascular Disease

## 2013-08-31 ENCOUNTER — Other Ambulatory Visit: Payer: Self-pay | Admitting: Podiatrist

## 2013-12-21 ENCOUNTER — Ambulatory Visit: Payer: BC Managed Care – PPO | Admitting: Cardiovascular Disease

## 2014-01-04 ENCOUNTER — Ambulatory Visit: Payer: BC Managed Care – PPO | Admitting: Cardiovascular Disease

## 2014-01-15 ENCOUNTER — Ambulatory Visit: Payer: BC Managed Care – PPO | Admitting: Cardiovascular Disease

## 2014-09-10 ENCOUNTER — Encounter: Payer: Self-pay | Admitting: Cardiology

## 2015-03-15 ENCOUNTER — Other Ambulatory Visit (HOSPITAL_COMMUNITY): Payer: Self-pay | Admitting: Orthopaedic Surgery

## 2015-03-15 ENCOUNTER — Ambulatory Visit: Payer: Self-pay | Admitting: Cardiovascular Disease

## 2015-03-30 NOTE — Pre-Procedure Instructions (Signed)
Kathleen [name]  03/30/2015       Your procedure is scheduled on June 10.  Report to [facility] Admitting at 5:30 A.M.  Call this number if you have problems the morning of surgery:  [phone]   Remember:  Do not eat food or drink liquids after midnight.  Take these medicines the morning of surgery with A SIP OF WATER Amlodipine, Carvediolol, Levothyroxine, Dulera (as needed)   STOP Asprin om June 3   STOP/ Do not take Aspirin, Aleve, Naproxen, Advil, Ibuprofen, Motrin, Vitamins, Herbs, or Supplements starting June 3   Do not wear jewelry, make-up or nail polish.  Do not wear lotions, powders, or perfumes.  You may wear deodorant.  Do not shave 48 hours prior to surgery.  Men may shave face and neck.  Do not bring valuables to the hospital.  [facility] is not responsible for any belongings or valuables.  Contacts, dentures or bridgework may not be worn into surgery.  Leave your suitcase in the car.  After surgery it may be brought to your room.  For patients admitted to the hospital, discharge time will be determined by your treatment team.  Patients discharged the day of surgery will not be allowed to drive home.   [place] - Preparing for Surgery  Before surgery, you can play an important role.  Because skin is not sterile, your skin needs to be as free of germs as possible.  You can reduce the number of germs on you skin by washing with CHG (chlorahexidine gluconate) soap before surgery.  CHG is an antiseptic cleaner which kills germs and bonds with the skin to continue killing germs even after washing.  Please DO NOT use if you have an allergy to CHG or antibacterial soaps.  If your skin becomes reddened/irritated stop using the CHG and inform your nurse when you arrive at Short Stay.  Do not shave (including legs and underarms) for at least 48 hours prior to the first CHG shower.  You may shave your face.  Please follow these instructions  carefully:   1.  Shower with CHG Soap the night before surgery and the morning of Surgery.  2.  If you choose to wash your hair, wash your hair first as usual with your normal shampoo.  3.  After you shampoo, rinse your hair and body thoroughly to remove the shampoo.  4.  Use CHG as you would any other liquid soap.  You can apply CHG directly to the skin and wash gently with scrungie or a clean washcloth.  5.  Apply the CHG Soap to your body ONLY FROM THE NECK DOWN.  Do not use on open wounds or open sores.  Avoid contact with your eyes, ears, mouth and genitals (private parts).  Wash genitals (private parts) with your normal soap.  6.  Wash thoroughly, paying special attention to the area where your surgery will be performed.  7.  Thoroughly rinse your body with warm water from the neck down.  8.  DO NOT shower/wash with your normal soap after using and rinsing off the CHG Soap.  9.  Pat yourself dry with a clean towel.            10.  Wear clean pajamas.            11.  Place clean sheets on your bed the night of your first shower and do not sleep with pets.  Day of Surgery  Do not apply any lotions the morning of surgery.  Please wear clean clothes to the hospital/surgery center.    Please read over the following fact sheets that you were given. Pain Booklet, Coughing and Deep Breathing, Total Joint Packet and Surgical Site Infection Prevention

## 2015-04-02 ENCOUNTER — Inpatient Hospital Stay (HOSPITAL_COMMUNITY)
Admission: RE | Admit: 2015-04-02 | Discharge: 2015-04-02 | Disposition: A | Discharge status: Home / Self Care | Payer: Self-pay | Source: Ambulatory Visit

## 2015-04-08 ENCOUNTER — Other Ambulatory Visit (HOSPITAL_COMMUNITY): Payer: BLUE CROSS/BLUE SHIELD

## 2015-04-11 ENCOUNTER — Ambulatory Visit: Payer: Self-pay | Admitting: Cardiovascular Disease

## 2015-04-12 ENCOUNTER — Encounter (HOSPITAL_COMMUNITY): Admission: RE | Payer: Self-pay | Source: Ambulatory Visit

## 2015-04-12 ENCOUNTER — Inpatient Hospital Stay (HOSPITAL_COMMUNITY)
Admission: RE | Admit: 2015-04-12 | Payer: BLUE CROSS/BLUE SHIELD | Source: Ambulatory Visit | Admitting: Orthopaedic Surgery

## 2015-04-12 SURGERY — ARTHROPLASTY, KNEE, TOTAL, USING IMAGELESS COMPUTER-ASSISTED NAVIGATION
Anesthesia: Spinal | Laterality: Right

## 2015-04-15 ENCOUNTER — Encounter: Payer: Self-pay | Admitting: Cardiovascular Disease

## 2015-04-15 ENCOUNTER — Ambulatory Visit (INDEPENDENT_AMBULATORY_CARE_PROVIDER_SITE_OTHER): Payer: BLUE CROSS/BLUE SHIELD | Admitting: Cardiovascular Disease

## 2015-04-15 VITALS — BP 160/92 | HR 56 | Ht 68.0 in | Wt 262.0 lb

## 2015-04-15 DIAGNOSIS — Z1231 Encounter for screening mammogram for malignant neoplasm of breast: Secondary | ICD-10-CM

## 2015-04-15 DIAGNOSIS — I10 Essential (primary) hypertension: Secondary | ICD-10-CM

## 2015-04-15 DIAGNOSIS — E785 Hyperlipidemia, unspecified: Secondary | ICD-10-CM

## 2015-04-15 DIAGNOSIS — I2510 Atherosclerotic heart disease of native coronary artery without angina pectoris: Secondary | ICD-10-CM

## 2015-04-15 DIAGNOSIS — R079 Chest pain, unspecified: Secondary | ICD-10-CM

## 2015-04-15 DIAGNOSIS — R0602 Shortness of breath: Secondary | ICD-10-CM

## 2015-04-15 DIAGNOSIS — Z01810 Encounter for preprocedural cardiovascular examination: Secondary | ICD-10-CM

## 2015-04-15 HISTORY — DX: Encounter for screening mammogram for malignant neoplasm of breast: Z12.31

## 2015-04-15 MED ORDER — ROSUVASTATIN CALCIUM 5 MG PO TABS: 5.0000 mg | ORAL_TABLET | Freq: Every day | ORAL | Status: DC

## 2015-04-15 MED ORDER — LISINOPRIL 20 MG PO TABS: 20.0000 mg | ORAL_TABLET | Freq: Every day | ORAL | Status: DC

## 2015-04-15 NOTE — Patient Instructions (Signed)
Medication Instructions:  Your physician has recommended you make the following change in your medication:  1) START taking lisinopril 20mg  once a day 2) START taking crestor 5mg  once a day   Labwork: Your physician recommends that you return for lab work in one week: BMET  Your physician recommends that you return for a FASTING lipid and liver profile in one month. Nothing to eat or drink after midnight the day before your lab work.    Testing/Procedures: none  Follow-Up: Your physician recommends that you schedule a follow-up appointment in: three months with Dr. Fletcher Anon.    Any Other Special Instructions Will Be Listed Below (If Applicable).

## 2015-04-15 NOTE — Assessment & Plan Note (Signed)
Given history of moderate one vessel coronary artery disease, I elected to resume treatment with rosuvastatin 5 mg once daily. Hopefully this will be covered by her insurance now given that it is generic. I requested fasting lipid and liver profile to be done in one month.

## 2015-04-15 NOTE — Progress Notes (Signed)
HPI  This is a 59 year old female who is here today for preoperative cardiovascular evaluation for knee surgery. She was last seen by me in 2013.  She has known history of coronary artery disease with moderate mid LAD stenosis detected on cardiac catheterization in February of 2012. FFR ratio was 0.83. The patient also has hypertension with hypertensive heart disease. She has known history of hyperlipidemia with intolerance to simvastatin and pravastatin. She tolerated small dose rosuvastatin in the past but was not covered by her insurance. She quit smoking in 2013. She continues to struggle with obesity and needs to have right knee replacement. She denies any recent episodes of chest pain. She has chronic exertional dyspnea which did not worsen recently. She is able to do activities of daily living with no significant limitations. She did some heavy work around the house recently with no significant symptoms. Blood pressure continues to be elevated. She has known history of sleep apnea but she does not use a CPAP machine on a regular basis. She continues to be under significant stress.  Allergies  Allergen Reactions  . Lorabid [Loracarbef]   . Penicillins     REACTION: Syncope  . Prednisone     Crazy      Current Outpatient Prescriptions on File Prior to Visit  Medication Sig Dispense Refill  . amLODipine (NORVASC) 10 MG tablet TAKE 1 TABLET BY MOUTH ONCE DAILY 30 tablet 5  . aspirin 81 MG tablet Take 81 mg by mouth daily.      . carisoprodol (SOMA) 350 MG tablet Take 350 mg by mouth 4 (four) times daily as needed.      . carvedilol (COREG) 6.25 MG tablet TAKE 1 TABLET BY MOUTH TWICE DAILY 60 tablet 5  . fluocinonide cream (LIDEX) 0.94 % Apply 1 application topically as needed (psoriasis).    . furosemide (LASIX) 40 MG tablet Take 40 mg by mouth daily as needed for fluid.     Marland Kitchen levothyroxine (SYNTHROID, LEVOTHROID) 200 MCG tablet Take 200 mcg by mouth daily. Brand only    .  mometasone-formoterol (DULERA) 100-5 MCG/ACT AERO Inhale 2 puffs into the lungs as needed for wheezing or shortness of breath.    Marland Kitchen NITROSTAT 0.4 MG SL tablet Take 1 tablet by mouth Every 5 minutes as needed.     No current facility-administered medications on file prior to visit.     Past Medical History  Diagnosis Date  . Chronic back pain   . Hx of thyroid cancer   . Obesity   . Sleep apnea, obstructive     possible  . Tobacco abuse   . Coronary artery disease 2012    Mild to Moderate  . Hyperlipidemia   . Hypertension      Past Surgical History  Procedure Laterality Date  . Thyroidectomy  1999  . Abdominal hysterectomy  1999  . Cardiac catheterization  Feb 2012    LAD: 60% mid (FFR ratio 0.83), LCX: 30-40%, RCA 20%.      Family History  Problem Relation Age of Onset  . Diabetes Mother      History   Social History  . Marital Status: Divorced    Spouse Name: N/A  . Number of Children: N/A  . Years of Education: N/A   Occupational History  . Not on file.   Social History Main Topics  . Smoking status: Former Smoker -- 0.25 packs/day for 3 years    Types: Cigarettes  . Smokeless tobacco: Not  on file  . Alcohol Use: 1.8 oz/week    3 Glasses of wine per week  . Drug Use: Not on file  . Sexual Activity: Not on file   Other Topics Concern  . Not on file   Social History Narrative       PHYSICAL EXAM   BP 160/92 mmHg  Pulse 56  Ht 5\' 8"  (1.727 m)  Wt 262 lb (118.842 kg)  BMI 39.85 kg/m2  Constitutional: She is oriented to person, place, and time. She appears well-developed and well-nourished. No distress.  HENT: No nasal discharge.  Head: Normocephalic and atraumatic.  Eyes: Pupils are equal, round, and reactive to light. Right eye exhibits no discharge. Left eye exhibits no discharge.  Neck: Normal range of motion. Neck supple. No JVD present. No thyromegaly present.  Cardiovascular: Normal rate, regular rhythm, normal heart sounds and  intact distal pulses. Exam reveals no gallop and no friction rub.  There is a 2/6 systolic ejection murmur in the aortic area. Pulmonary/Chest: Effort normal and breath sounds normal. No stridor. No respiratory distress. She has no wheezes. She has no rales. She exhibits no tenderness.  Abdominal: Soft. Bowel sounds are normal. She exhibits no distension. There is no tenderness. There is no rebound and no guarding.  Musculoskeletal: Normal range of motion. She exhibits no edema and no tenderness.  Neurological: She is alert and oriented to person, place, and time. Coordination normal.  Skin: Skin is warm and dry. No rash noted. She is not diaphoretic. No erythema. No pallor.  Psychiatric: She has a normal mood and affect. Her behavior is normal. Judgment and thought content normal.    EKG: Sinus  Bradycardia  Voltage criteria for LVH  (S(V1)+R(V6) exceeds 3.50 mV).   -Nonspecific ST depression   +   T-abnormality  -Seen with left ventricular hypertrophy (strain) . ABNORMAL    ASSESSMENT AND PLAN

## 2015-04-15 NOTE — Assessment & Plan Note (Signed)
She has no symptoms of angina. Continue medical therapy. 

## 2015-04-15 NOTE — Assessment & Plan Note (Signed)
Blood pressure continues to be elevated. I elected to resume lisinopril 20 mg once daily which she used to be on the past. Check basic metabolic profile in one week. I suspect that sleep apnea and obesity are contributing to her uncontrolled hypertension. I advised her to start using the CPAP machine.

## 2015-04-15 NOTE — Assessment & Plan Note (Signed)
The patient has known history of moderate one vessel coronary artery disease on previous cardiac catheterization in 2012. No cardiac events since then. She has no symptoms suggestive of angina and functional capacity is reasonable. Although EKG is abnormal, this reflects hypertensive heart disease with repolarization abnormalities likely due to uncontrolled hypertension. This only seems to be slightly different from her prior EKG. Due to all of the above, I do not recommend any further ischemic cardiac evaluation. The patient can proceed with surgery with an overall low to moderate risk.

## 2015-07-19 ENCOUNTER — Ambulatory Visit: Payer: BLUE CROSS/BLUE SHIELD | Admitting: Cardiovascular Disease

## 2015-08-26 ENCOUNTER — Encounter: Payer: Self-pay | Admitting: *Deleted

## 2015-08-26 ENCOUNTER — Ambulatory Visit: Payer: BLUE CROSS/BLUE SHIELD | Admitting: Cardiovascular Disease

## 2016-04-14 ENCOUNTER — Other Ambulatory Visit: Payer: Self-pay | Admitting: Orthopaedic Surgery

## 2016-04-16 NOTE — Pre-Procedure Instructions (Signed)
    JAMILLE SAVOIA  04/16/2016    Your procedure is scheduled on MONDAY, JUNE 26.  Report to [facility] Admitting at 10:30 A.M.               Your surgery or procedure is scheduled for 12:30 PM   Call this number if you have problems the morning of surgery:[phone]                For any other questions, please call [phone], Monday - Friday 8 AM - 4 PM.   Remember:  Do not eat food or drink liquids after midnight Sunday, June 25.  Take these medicines the morning of surgery with A SIP OF WATER: amLODipine (NORVASC), carvedilol (COREG), levothyroxine (SYNTHROID, LEVOTHROID),rosuvastatin (CRESTOR).   May use inhaler if needed.                  Stop Aspirin Monday, June 19.  Do Not take any Aspirin Products, Ibuprofen (Advil), Naproxen (Aleve), Herbal Products or Vitamins.   Do not wear jewelry, make-up or nail polish.  Do not wear lotions, powders, or perfumes.   Do not shave 48 hours prior to surgery.    Do not bring valuables to the hospital.  [facility] is not responsible for any belongings or valuables.  Contacts, dentures or bridgework may not be worn into surgery.  Leave your suitcase in the car.  After surgery it may be brought to your room.  For patients admitted to the hospital, discharge time will be determined by your treatment team.   Please read over the following fact sheets that you were given. [place]- Preparing for Surgery and Incentive Spirometry.

## 2016-04-17 ENCOUNTER — Ambulatory Visit (HOSPITAL_COMMUNITY)
Admission: RE | Admit: 2016-04-17 | Discharge: 2016-04-17 | Disposition: A | Discharge status: Home / Self Care | Payer: BLUE CROSS/BLUE SHIELD | Source: Ambulatory Visit | Attending: Orthopaedic Surgery | Admitting: Orthopaedic Surgery

## 2016-04-17 ENCOUNTER — Encounter (HOSPITAL_COMMUNITY)
Admission: RE | Admit: 2016-04-17 | Discharge: 2016-04-17 | Disposition: A | Discharge status: Home / Self Care | Payer: BLUE CROSS/BLUE SHIELD | Source: Ambulatory Visit | Attending: Orthopaedic Surgery | Admitting: Orthopaedic Surgery

## 2016-04-17 ENCOUNTER — Encounter (HOSPITAL_COMMUNITY): Payer: Self-pay

## 2016-04-17 DIAGNOSIS — M179 Osteoarthritis of knee, unspecified: Secondary | ICD-10-CM | POA: Diagnosis not present

## 2016-04-17 DIAGNOSIS — J8410 Pulmonary fibrosis, unspecified: Secondary | ICD-10-CM | POA: Insufficient documentation

## 2016-04-17 DIAGNOSIS — Z01818 Encounter for other preprocedural examination: Secondary | ICD-10-CM | POA: Insufficient documentation

## 2016-04-17 DIAGNOSIS — M1710 Unilateral primary osteoarthritis, unspecified knee: Secondary | ICD-10-CM

## 2016-04-17 DIAGNOSIS — Z01812 Encounter for preprocedural laboratory examination: Secondary | ICD-10-CM | POA: Insufficient documentation

## 2016-04-17 DIAGNOSIS — J849 Interstitial pulmonary disease, unspecified: Secondary | ICD-10-CM | POA: Diagnosis not present

## 2016-04-17 HISTORY — DX: Other specified postprocedural states: R11.2

## 2016-04-17 HISTORY — DX: Other specified postprocedural states: Z98.890

## 2016-04-17 HISTORY — DX: Reserved for inherently not codable concepts without codable children: IMO0001

## 2016-04-17 HISTORY — DX: Gastro-esophageal reflux disease without esophagitis: K21.9

## 2016-04-17 HISTORY — DX: Hypothyroidism, unspecified: E03.9

## 2016-04-17 HISTORY — DX: Malignant (primary) neoplasm, unspecified: C80.1

## 2016-04-17 HISTORY — DX: Other complications of anesthesia, initial encounter: T88.59XA

## 2016-04-17 HISTORY — DX: Unspecified osteoarthritis, unspecified site: M19.90

## 2016-04-17 HISTORY — DX: Personal history of other medical treatment: Z92.89

## 2016-04-17 HISTORY — DX: Cardiac murmur, unspecified: R01.1

## 2016-04-17 HISTORY — DX: Family history of other specified conditions: Z84.89

## 2016-04-17 HISTORY — DX: Adverse effect of unspecified anesthetic, initial encounter: T41.45XA

## 2016-04-17 LAB — COMPREHENSIVE METABOLIC PANEL
ALBUMIN: 3.7 g/dL (ref 3.5–5.0)
ALT: 25 U/L (ref 14–54)
AST: 23 U/L (ref 15–41)
Alkaline Phosphatase: 83 U/L (ref 38–126)
Anion gap: 6 (ref 5–15)
BILIRUBIN TOTAL: 0.6 mg/dL (ref 0.3–1.2)
BUN: 15 mg/dL (ref 6–20)
CO2: 26 mmol/L (ref 22–32)
Calcium: 10.2 mg/dL (ref 8.9–10.3)
Chloride: 107 mmol/L (ref 101–111)
Creatinine, Ser: 0.83 mg/dL (ref 0.44–1.00)
GFR calc Af Amer: >60 mL/min (ref >60)
GFR calc non Af Amer: >60 mL/min (ref >60)
GLUCOSE: 91 mg/dL (ref 65–99)
POTASSIUM: 4 mmol/L (ref 3.5–5.1)
SODIUM: 139 mmol/L (ref 135–145)
TOTAL PROTEIN: 6.6 g/dL (ref 6.5–8.1)

## 2016-04-17 LAB — SURGICAL PCR SCREEN
MRSA, PCR: NEGATIVE
STAPHYLOCOCCUS AUREUS: NEGATIVE

## 2016-04-17 LAB — CBC
HEMATOCRIT: 46.2 % — AB (ref 36.0–46.0)
HEMOGLOBIN: 15.2 g/dL — AB (ref 12.0–15.0)
MCH: 29.1 pg (ref 26.0–34.0)
MCHC: 32.9 g/dL (ref 30.0–36.0)
MCV: 88.3 fL (ref 78.0–100.0)
Platelets: 281 10*3/uL (ref 150–400)
RBC: 5.23 MIL/uL — ABNORMAL HIGH (ref 3.87–5.11)
RDW: 13.7 % (ref 11.5–15.5)
WBC: 9.4 10*3/uL (ref 4.0–10.5)

## 2016-04-17 LAB — PROTIME-INR
INR: 1.04 (ref 0.00–1.49)
Prothrombin Time: 13.8 seconds (ref 11.6–15.2)

## 2016-04-17 NOTE — Progress Notes (Addendum)
Kathleen [name] has history of HTN, chest pain- in 2012- denies any recent chest pain. Patient was seen by cardiologist in 2012 for chest pain.  Patient had an Stress test and Cardiac Cath-  In 12/2010, cath showed moderate one vessel disease.    Kathleen [name] denies shortness of breath,except with exertion.  Patients  Blood pressure was elevated today at PAT appointment, 198/75.  Patient states that she has been out of blood pressure medications for 2 days, but she will pick up prescription and start back on them.  Patient has stopped taking Crestor- "makes legs hurt". Patient reports that Dr Kathryne Eriksson, PCP is aware that she is not taking Crestor. Dr Fletcher Anon ordered Crestor.  Patient states that she has had an EKG in the last year at Dr Rich Fuchs office. I will request records.  Dr Fletcher Anon notes reports that patient has sleep apnea and has a CPAP that she does not use regularly- patient denies being tested for sleep apnea.  Stop Bang Score is 4. Patient was cleared by Dr Fletcher Anon for  knee replacement 04/2015-, surgery was cancelled.  Instructions were to follow up with Dr April Holding in 3 months, patient has not been  seen by Dr Fletcher Anon since 04/2015.  Patient was unable to void. 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  Malachy Mood said it would be ok to have urine ran at that office and fax results to Dr Lorin Mercy office.  I notified patient of this. EKG from Dr Dois Davenport office was done 03/26/15.

## 2016-04-17 NOTE — Pre-Procedure Instructions (Signed)
    Kathleen [name]  04/17/2016    Your procedure is scheduled on MONDAY, JUNE 26.  Report to [facility] Admitting at 10:30 A.M.               Your surgery or procedure is scheduled for 12:30 PM   Call this number if you have problems the morning of [phone]                For any other questions, please call [phone], Monday - Friday 8 AM - 4 PM.   Remember:  Do not eat food or drink liquids after midnight Sunday, June 25.  Take these medicines the morning of surgery with A SIP OF WATER: amLODipine (NORVASC),  levothyroxine (SYNTHROID, LEVOTHROID).   May use inhaler if needed.                                    Stop Aspirin Monday, June 19.  Do Not take any Aspirin Products, Ibuprofen (Advil), Naproxen (Aleve), Herbal Products or Vitamins.   Do not wear jewelry, make-up or nail polish.  Do not wear lotions, powders, or perfumes.   Do not shave 48 hours prior to surgery.    Do not bring valuables to the hospital.  [facility] is not responsible for any belongings or valuables.  Contacts, dentures or bridgework may not be worn into surgery.  Leave your suitcase in the car.  After surgery it may be brought to your room.  For patients admitted to the hospital, discharge time will be determined by your treatment team.   Please read over the following fact sheets that you were given. [place]- Preparing for Surgery and Incentive Spirometry.

## 2016-04-20 NOTE — Progress Notes (Addendum)
Anesthesia Chart Review:  Pt is a 60 year old female scheduled for computer assisted R total knee arthroplasty on 04/27/2016 with Dr. Lorin Mercy.   Cardiologist is Dr. [name], last office visit one year ago; pt was scheduled for knee replacement at that time and Dr. Fletcher Anon cleared her for it, but the surgery was cancelled; pt has not seen Dr. Fletcher Anon since (she no-showed for a 08/2015 appt).   PMH includes:  CAD, HTN, hyperlipidemia, heart murmur, hypothyroidism, OSA, post-op N/V, GERD. Former smoker. BMI 40  BP was 198/75, 166/100. Pt reports she ran out of bp meds 2 days ago. Pt instructed to refill meds and resume taking.   Medications include: amlodipine, ASA, lasix, levothyroxine, lisinopril, dulera, prilosec  Preoperative labs reviewed.    Chest x-ray 04/17/16 reviewed. No edema or consolidation. Calcified granuloma right upper lobe.  EKG will be obtained DOS.   Echo 08/15/12:  - Left ventricle: The cavity size was normal. Wall thickness was increased in a pattern of mild LVH. There was moderate asymmetric hypertrophy. Systolic function was vigorous. The estimated ejection fraction was in the range of 65% to 70%. Doppler parameters are consistent with abnormal left ventricular relaxation (grade 1 diastolic dysfunction).  Cardiac cath 12/24/10:  1. Moderate (60%) nonobstructive mid left anterior descending stenosis with a correlating FFR ratio of 0.83. No evidence of any other obstructive disease. There is mild disease in the left circumflex (30% ostial, 40% tubular in OM3) as well as right coronary artery (20%). 2. Severe systemic hypertension with moderately elevated left ventricular end-diastolic pressure. 3. Normal left ventricular systolic function.  Reviewed case with Dr. Linna Caprice. Pt will need cardiac evaluation prior to surgery. Left voicemail for [facility] in Dr. Lorin Mercy' office.   Willeen Cass, FNP-BC [facility] Short Stay Surgical Center/Anesthesiology Phone:  [phone] 04/20/2016 4:54 PM  Addendum:   Pt saw Abran Richard, MD with cardiology in [facility] for pre-op eval on 04/28/16.  Pt has resumed BP meds and bp at that visit was 124/82. Dr. Otho Perl cleared pt for surgery at low to moderate risk.   If EKG acceptable, I anticipate pt can proceed with surgery as scheduled.   Willeen Cass, FNP-BC [facility] Short Stay Surgical Center/Anesthesiology Phone: [phone] 04/29/2016 1:13 PM

## 2016-04-28 DIAGNOSIS — M1710 Unilateral primary osteoarthritis, unspecified knee: Secondary | ICD-10-CM

## 2016-04-28 DIAGNOSIS — I2510 Atherosclerotic heart disease of native coronary artery without angina pectoris: Secondary | ICD-10-CM

## 2016-04-28 DIAGNOSIS — E785 Hyperlipidemia, unspecified: Secondary | ICD-10-CM

## 2016-04-28 DIAGNOSIS — I10 Essential (primary) hypertension: Secondary | ICD-10-CM

## 2016-04-28 DIAGNOSIS — Z01810 Encounter for preprocedural cardiovascular examination: Secondary | ICD-10-CM

## 2016-04-28 HISTORY — DX: Hyperlipidemia, unspecified: E78.5

## 2016-04-28 HISTORY — DX: Essential (primary) hypertension: I10

## 2016-04-28 HISTORY — DX: Unilateral primary osteoarthritis, unspecified knee: M17.10

## 2016-04-28 HISTORY — DX: Encounter for preprocedural cardiovascular examination: Z01.810

## 2016-04-28 HISTORY — DX: Atherosclerotic heart disease of native coronary artery without angina pectoris: I25.10

## 2016-04-28 NOTE — Progress Notes (Signed)
Left message with Malachy Mood at Dr. Lorin Mercy office notifying her date for cardiac clearance appointment.

## 2016-04-30 MED ORDER — CLINDAMYCIN PHOSPHATE 900 MG/50ML IV SOLN
900.0000 mg | INTRAVENOUS | Status: AC
  Administered 2016-05-01: 900 mg via INTRAVENOUS
  Filled 2016-04-30: qty 50

## 2016-04-30 NOTE — H&P (Signed)
TOTAL KNEE ADMISSION H&P  Patient is being admitted for right total knee arthroplasty.  Subjective:  Chief Complaint:right knee pain.  HPI: Kathleen [name], 60 y.o. female, has a history of pain and functional disability in the right knee due to arthritis and has failed non-surgical conservative treatments for greater than 12 weeks to includeNSAID's and/or analgesics, corticosteriod injections, use of assistive devices and activity modification.  Onset of symptoms was gradual, starting 10 years ago with gradually worsening course since that time.   Patient currently rates pain in the right knee(s) at 10 out of 10 with activity. Patient has night pain, worsening of pain with activity and weight bearing, pain with passive range of motion and joint swelling.  Patient has evidence of subchondral sclerosis, periarticular osteophytes and joint space narrowing by imaging studies. . There is no active infection.  Patient Active Problem List   Diagnosis Date Noted  . Pre-operative cardiovascular examination 04/15/2015  . Murmur 08/03/2012  . Palpitations 08/03/2012  . Coronary artery disease   . Hyperlipidemia   . Hypertension    Past Medical History  Diagnosis Date  . Chronic back pain   . Hx of thyroid cancer   . Obesity   . Sleep apnea, obstructive     possible  . Tobacco abuse   . Coronary artery disease 2012    Mild to Moderate  . Hyperlipidemia   . Hypertension   . Hypothyroidism   . Complication of anesthesia     Woke up during  Hysterectomy  . PONV (postoperative nausea and vomiting)     with thyroid surgery  . Family history of adverse reaction to anesthesia   . Heart murmur     "nothing to be concerned about  . Shortness of breath dyspnea     with exertion  . Cancer ([place])   . GERD (gastroesophageal reflux disease)     omeprazole prn  . Arthritis   . History of blood transfusion     Past Surgical History  Procedure Laterality Date  . Thyroidectomy  2000  . Abdominal  hysterectomy  1999  . Cardiac catheterization  Feb 2012    LAD: 60% mid (FFR ratio 0.83), LCX: 30-40%, RCA 20%.     No prescriptions prior to admission   Allergies  Allergen Reactions  . Lorabid [Loracarbef]   . Penicillins     REACTION: Syncope  . Prednisone     Crazy     Social History  Substance Use Topics  . Smoking status: Former Smoker -- 0.25 packs/day for 20 years    Types: Cigarettes  . Smokeless tobacco: Not on file     Comment: quit 2012  . Alcohol Use: No    Family History  Problem Relation Age of Onset  . Diabetes Mother      Review of Systems  Constitutional: Negative.   HENT: Negative.   Respiratory: Negative.   Cardiovascular: Negative.   Gastrointestinal: Negative.   Genitourinary: Negative.   Musculoskeletal: Positive for joint pain.  Skin: Negative.   Neurological: Negative.   Psychiatric/Behavioral: Negative.     Objective:  Physical Exam  Constitutional: She is oriented to person, place, and time. No distress.  HENT:  Head: Atraumatic.  Eyes: EOM are normal.  Neck: Normal range of motion.  Respiratory: No respiratory distress.  GI: She exhibits no distension.  Musculoskeletal: She exhibits tenderness.  Neurological: She is alert and oriented to person, place, and time.  Skin: Skin is warm and dry.  Vital signs in last 24 hours:    Labs:   Estimated body mass index is 39.85 kg/(m^2) as calculated from the following:   Height as of 04/15/15: 5\' 8"  (1.727 m).   Weight as of 04/15/15: 118.842 kg (262 lb).   Imaging Review Plain radiographs demonstrate moderate degenerative joint disease of the right knee(s). The overall alignment ismild varus. The bone quality appears to be good for age and reported activity level.  Assessment/Plan:  End stage arthritis, right knee   The patient history, physical examination, clinical judgment of the provider and imaging studies are consistent with end stage degenerative joint disease of the  right knee(s) and total knee arthroplasty is deemed medically necessary. The treatment options including medical management, injection therapy arthroscopy and arthroplasty were discussed at length. The risks and benefits of total knee arthroplasty were presented and reviewed. The risks due to aseptic loosening, infection, stiffness, patella tracking problems, thromboembolic complications and other imponderables were discussed. The patient acknowledged the explanation, agreed to proceed with the plan and consent was signed. Patient is being admitted for inpatient treatment for surgery, pain control, PT, OT, prophylactic antibiotics, VTE prophylaxis, progressive ambulation and ADL's and discharge planning. The patient is planning to be discharged home with home health services

## 2016-04-30 NOTE — Anesthesia Preprocedure Evaluation (Addendum)
Anesthesia Evaluation  Patient identified by MRN, date of birth, ID band Patient awake    Reviewed: Allergy & Precautions, H&P , NPO status , Patient's Chart, lab work & pertinent test results, reviewed documented beta blocker date and time   History of Anesthesia Complications (+) PONV  Airway Mallampati: II  TM Distance: >3 FB Neck ROM: Full    Dental no notable dental hx. (+) Teeth Intact, Dental Advisory Given   Pulmonary former smoker,    Pulmonary exam normal breath sounds clear to auscultation       Cardiovascular hypertension, Pt. on medications and Pt. on home beta blockers + CAD   Rhythm:Regular Rate:Normal     Neuro/Psych negative neurological ROS  negative psych ROS   GI/Hepatic Neg liver ROS, GERD  Medicated and Controlled,  Endo/Other  Hypothyroidism Morbid obesity  Renal/GU negative Renal ROS  negative genitourinary   Musculoskeletal  (+) Arthritis , Osteoarthritis,    Abdominal   Peds  Hematology negative hematology ROS (+)   Anesthesia Other Findings   Reproductive/Obstetrics negative OB ROS                           Anesthesia Physical Anesthesia Plan  ASA: III  Anesthesia Plan: MAC and Spinal   Post-op Pain Management:    Induction: Intravenous  Airway Management Planned: Simple Face Mask  Additional Equipment:   Intra-op Plan:   Post-operative Plan:   Informed Consent: I have reviewed the patients History and Physical, chart, labs and discussed the procedure including the risks, benefits and alternatives for the proposed anesthesia with the patient or authorized representative who has indicated his/her understanding and acceptance.   Dental advisory given  Plan Discussed with: CRNA  Anesthesia Plan Comments:        Anesthesia Quick Evaluation

## 2016-05-01 ENCOUNTER — Inpatient Hospital Stay (HOSPITAL_COMMUNITY): Payer: BLUE CROSS/BLUE SHIELD | Admitting: Emergency Medicine

## 2016-05-01 ENCOUNTER — Encounter (HOSPITAL_COMMUNITY): Payer: Self-pay | Admitting: *Deleted

## 2016-05-01 ENCOUNTER — Inpatient Hospital Stay (HOSPITAL_COMMUNITY)
Admission: RE | Admit: 2016-05-01 | Discharge: 2016-05-03 | DRG: 470 | Disposition: A | Discharge status: Home Health | Payer: BLUE CROSS/BLUE SHIELD | Source: Ambulatory Visit | Attending: Orthopaedic Surgery | Admitting: Orthopaedic Surgery

## 2016-05-01 ENCOUNTER — Encounter (HOSPITAL_COMMUNITY)
Admission: RE | Disposition: A | Discharge status: Home Health | Payer: Self-pay | Source: Ambulatory Visit | Attending: Orthopaedic Surgery

## 2016-05-01 ENCOUNTER — Inpatient Hospital Stay (HOSPITAL_COMMUNITY): Payer: BLUE CROSS/BLUE SHIELD | Admitting: Anesthesiology

## 2016-05-01 DIAGNOSIS — G8929 Other chronic pain: Secondary | ICD-10-CM | POA: Diagnosis present

## 2016-05-01 DIAGNOSIS — I10 Essential (primary) hypertension: Secondary | ICD-10-CM | POA: Diagnosis present

## 2016-05-01 DIAGNOSIS — Z6841 Body Mass Index (BMI) 40.0 and over, adult: Secondary | ICD-10-CM

## 2016-05-01 DIAGNOSIS — G4733 Obstructive sleep apnea (adult) (pediatric): Secondary | ICD-10-CM | POA: Diagnosis present

## 2016-05-01 DIAGNOSIS — Z87891 Personal history of nicotine dependence: Secondary | ICD-10-CM | POA: Diagnosis not present

## 2016-05-01 DIAGNOSIS — Z85850 Personal history of malignant neoplasm of thyroid: Secondary | ICD-10-CM | POA: Diagnosis not present

## 2016-05-01 DIAGNOSIS — E785 Hyperlipidemia, unspecified: Secondary | ICD-10-CM | POA: Diagnosis present

## 2016-05-01 DIAGNOSIS — Z880 Allergy status to penicillin: Secondary | ICD-10-CM | POA: Diagnosis not present

## 2016-05-01 DIAGNOSIS — M25561 Pain in right knee: Secondary | ICD-10-CM | POA: Diagnosis not present

## 2016-05-01 DIAGNOSIS — Z96651 Presence of right artificial knee joint: Secondary | ICD-10-CM

## 2016-05-01 DIAGNOSIS — K219 Gastro-esophageal reflux disease without esophagitis: Secondary | ICD-10-CM | POA: Diagnosis not present

## 2016-05-01 DIAGNOSIS — M549 Dorsalgia, unspecified: Secondary | ICD-10-CM | POA: Diagnosis not present

## 2016-05-01 DIAGNOSIS — M179 Osteoarthritis of knee, unspecified: Secondary | ICD-10-CM | POA: Diagnosis not present

## 2016-05-01 DIAGNOSIS — M1711 Unilateral primary osteoarthritis, right knee: Principal | ICD-10-CM | POA: Diagnosis present

## 2016-05-01 DIAGNOSIS — E039 Hypothyroidism, unspecified: Secondary | ICD-10-CM | POA: Diagnosis present

## 2016-05-01 DIAGNOSIS — I2510 Atherosclerotic heart disease of native coronary artery without angina pectoris: Secondary | ICD-10-CM | POA: Diagnosis not present

## 2016-05-01 DIAGNOSIS — D62 Acute posthemorrhagic anemia: Secondary | ICD-10-CM | POA: Diagnosis not present

## 2016-05-01 DIAGNOSIS — Z888 Allergy status to other drugs, medicaments and biological substances status: Secondary | ICD-10-CM

## 2016-05-01 DIAGNOSIS — R011 Cardiac murmur, unspecified: Secondary | ICD-10-CM | POA: Diagnosis not present

## 2016-05-01 HISTORY — PX: KNEE ARTHROPLASTY: SHX992

## 2016-05-01 HISTORY — DX: Presence of right artificial knee joint: Z96.651

## 2016-05-01 SURGERY — ARTHROPLASTY, KNEE, TOTAL, USING IMAGELESS COMPUTER-ASSISTED NAVIGATION
Anesthesia: Monitor Anesthesia Care | Site: Knee | Laterality: Right

## 2016-05-01 MED ORDER — PHENYLEPHRINE 40 MCG/ML (10ML) SYRINGE FOR IV PUSH (FOR BLOOD PRESSURE SUPPORT)
PREFILLED_SYRINGE | INTRAVENOUS | Status: AC
  Filled 2016-05-01: qty 10

## 2016-05-01 MED ORDER — PHENYLEPHRINE HCL 10 MG/ML IJ SOLN
INTRAMUSCULAR | Status: DC | PRN
  Administered 2016-05-01: 40 ug via INTRAVENOUS
  Administered 2016-05-01 (×2): 80 ug via INTRAVENOUS
  Administered 2016-05-01: 40 ug via INTRAVENOUS

## 2016-05-01 MED ORDER — BUPIVACAINE LIPOSOME 1.3 % IJ SUSP
20.0000 mL | INTRAMUSCULAR | Status: AC
  Administered 2016-05-01: 20 mL
  Filled 2016-05-01: qty 20

## 2016-05-01 MED ORDER — PANTOPRAZOLE SODIUM 40 MG PO TBEC
40.0000 mg | DELAYED_RELEASE_TABLET | Freq: Every day | ORAL | Status: DC
  Administered 2016-05-02 – 2016-05-03 (×2): 40 mg via ORAL
  Filled 2016-05-01 (×3): qty 1

## 2016-05-01 MED ORDER — LISINOPRIL 20 MG PO TABS
20.0000 mg | ORAL_TABLET | Freq: Every day | ORAL | Status: DC
  Administered 2016-05-02 – 2016-05-03 (×2): 20 mg via ORAL
  Filled 2016-05-01 (×2): qty 1

## 2016-05-01 MED ORDER — 0.9 % SODIUM CHLORIDE (POUR BTL) OPTIME
TOPICAL | Status: DC | PRN
  Administered 2016-05-01: 1000 mL

## 2016-05-01 MED ORDER — DEXAMETHASONE SODIUM PHOSPHATE 10 MG/ML IJ SOLN
INTRAMUSCULAR | Status: AC
Start: 2016-05-01 — End: 2016-05-01
  Filled 2016-05-01: qty 1

## 2016-05-01 MED ORDER — METOCLOPRAMIDE HCL 5 MG/ML IJ SOLN: 5.0000 mg | Freq: Three times a day (TID) | INTRAMUSCULAR | Status: DC | PRN

## 2016-05-01 MED ORDER — CLINDAMYCIN PHOSPHATE 600 MG/50ML IV SOLN
600.0000 mg | Freq: Four times a day (QID) | INTRAVENOUS | Status: AC
  Administered 2016-05-01 (×2): 600 mg via INTRAVENOUS
  Filled 2016-05-01 (×2): qty 50

## 2016-05-01 MED ORDER — ACETAMINOPHEN 325 MG PO TABS
650.0000 mg | ORAL_TABLET | Freq: Four times a day (QID) | ORAL | Status: DC | PRN
  Administered 2016-05-02: 650 mg via ORAL
  Filled 2016-05-01: qty 2

## 2016-05-01 MED ORDER — PHENOL 1.4 % MT LIQD: 1.0000 | OROMUCOSAL | Status: DC | PRN

## 2016-05-01 MED ORDER — SODIUM CHLORIDE 0.9 % IR SOLN
Status: DC | PRN
  Administered 2016-05-01 (×2): 1000 mL

## 2016-05-01 MED ORDER — HYDROMORPHONE HCL 1 MG/ML IJ SOLN
0.2500 mg | INTRAMUSCULAR | Status: DC | PRN
  Administered 2016-05-01 (×2): 0.5 mg via INTRAVENOUS

## 2016-05-01 MED ORDER — MIDAZOLAM HCL 2 MG/2ML IJ SOLN
INTRAMUSCULAR | Status: AC
  Filled 2016-05-01: qty 2

## 2016-05-01 MED ORDER — ROSUVASTATIN CALCIUM 5 MG PO TABS
5.0000 mg | ORAL_TABLET | Freq: Every day | ORAL | Status: DC
  Filled 2016-05-01 (×2): qty 1

## 2016-05-01 MED ORDER — AMLODIPINE BESYLATE 10 MG PO TABS
10.0000 mg | ORAL_TABLET | Freq: Every day | ORAL | Status: DC
  Administered 2016-05-02 – 2016-05-03 (×2): 10 mg via ORAL
  Filled 2016-05-01 (×2): qty 1

## 2016-05-01 MED ORDER — BUPIVACAINE IN DEXTROSE 0.75-8.25 % IT SOLN
INTRATHECAL | Status: DC | PRN
  Administered 2016-05-01: 15 mg via INTRATHECAL

## 2016-05-01 MED ORDER — HYDROMORPHONE HCL 1 MG/ML IJ SOLN
INTRAMUSCULAR | Status: AC
  Filled 2016-05-01: qty 1

## 2016-05-01 MED ORDER — PROPOFOL 1000 MG/100ML IV EMUL
INTRAVENOUS | Status: AC
  Filled 2016-05-01: qty 200

## 2016-05-01 MED ORDER — EPHEDRINE SULFATE 50 MG/ML IJ SOLN
INTRAMUSCULAR | Status: DC | PRN
Start: 2016-05-01 — End: 2016-05-01
  Administered 2016-05-01: 10 mg via INTRAVENOUS

## 2016-05-01 MED ORDER — LEVOTHYROXINE SODIUM 100 MCG PO TABS
200.0000 ug | ORAL_TABLET | Freq: Every day | ORAL | Status: DC
  Administered 2016-05-02 – 2016-05-03 (×2): 200 ug via ORAL
  Filled 2016-05-01 (×2): qty 2

## 2016-05-01 MED ORDER — CHLORHEXIDINE GLUCONATE 4 % EX LIQD: 60.0000 mL | Freq: Once | CUTANEOUS | Status: DC

## 2016-05-01 MED ORDER — HYDROMORPHONE HCL 1 MG/ML IJ SOLN
1.0000 mg | INTRAMUSCULAR | Status: DC | PRN
  Administered 2016-05-01 – 2016-05-02 (×7): 1 mg via INTRAVENOUS
  Filled 2016-05-01 (×7): qty 1

## 2016-05-01 MED ORDER — ONDANSETRON HCL 4 MG PO TABS: 4.0000 mg | ORAL_TABLET | Freq: Four times a day (QID) | ORAL | Status: DC | PRN

## 2016-05-01 MED ORDER — METHOCARBAMOL 500 MG PO TABS
ORAL_TABLET | ORAL | Status: AC
  Filled 2016-05-01: qty 1

## 2016-05-01 MED ORDER — METHOCARBAMOL 1000 MG/10ML IJ SOLN
500.0000 mg | Freq: Four times a day (QID) | INTRAVENOUS | Status: DC | PRN
  Filled 2016-05-01: qty 5

## 2016-05-01 MED ORDER — MENTHOL 3 MG MT LOZG: 1.0000 | LOZENGE | OROMUCOSAL | Status: DC | PRN

## 2016-05-01 MED ORDER — PROPOFOL 10 MG/ML IV BOLUS
INTRAVENOUS | Status: AC
  Filled 2016-05-01: qty 40

## 2016-05-01 MED ORDER — OXYCODONE HCL 5 MG PO TABS
5.0000 mg | ORAL_TABLET | ORAL | Status: DC | PRN
  Administered 2016-05-01 – 2016-05-03 (×9): 10 mg via ORAL
  Filled 2016-05-01 (×9): qty 2

## 2016-05-01 MED ORDER — ONDANSETRON HCL 4 MG/2ML IJ SOLN
INTRAMUSCULAR | Status: AC
  Filled 2016-05-01: qty 2

## 2016-05-01 MED ORDER — ACETAMINOPHEN 650 MG RE SUPP: 650.0000 mg | Freq: Four times a day (QID) | RECTAL | Status: DC | PRN

## 2016-05-01 MED ORDER — MOMETASONE FURO-FORMOTEROL FUM 100-5 MCG/ACT IN AERO
2.0000 | INHALATION_SPRAY | RESPIRATORY_TRACT | Status: DC | PRN
  Filled 2016-05-01: qty 8.8

## 2016-05-01 MED ORDER — DOCUSATE SODIUM 100 MG PO CAPS
100.0000 mg | ORAL_CAPSULE | Freq: Two times a day (BID) | ORAL | Status: DC
  Administered 2016-05-01 – 2016-05-03 (×4): 100 mg via ORAL
  Filled 2016-05-01 (×4): qty 1

## 2016-05-01 MED ORDER — LACTATED RINGERS IV SOLN
INTRAVENOUS | Status: DC | PRN
  Administered 2016-05-01 (×2): via INTRAVENOUS

## 2016-05-01 MED ORDER — LIDOCAINE HCL (CARDIAC) 20 MG/ML IV SOLN
INTRAVENOUS | Status: DC | PRN
  Administered 2016-05-01 (×2): 50 mg via INTRAVENOUS

## 2016-05-01 MED ORDER — DEXAMETHASONE SODIUM PHOSPHATE 10 MG/ML IJ SOLN
INTRAMUSCULAR | Status: DC | PRN
  Administered 2016-05-01: 8 mg via INTRAVENOUS

## 2016-05-01 MED ORDER — EPHEDRINE 5 MG/ML INJ
INTRAVENOUS | Status: AC
  Filled 2016-05-01: qty 10

## 2016-05-01 MED ORDER — BUSPIRONE HCL 5 MG PO TABS
7.5000 mg | ORAL_TABLET | Freq: Two times a day (BID) | ORAL | Status: DC | PRN
  Administered 2016-05-01 – 2016-05-02 (×2): 7.5 mg via ORAL
  Filled 2016-05-01 (×2): qty 2

## 2016-05-01 MED ORDER — METHOCARBAMOL 500 MG PO TABS
500.0000 mg | ORAL_TABLET | Freq: Four times a day (QID) | ORAL | Status: DC | PRN
  Administered 2016-05-01 – 2016-05-03 (×7): 500 mg via ORAL
  Filled 2016-05-01 (×6): qty 1

## 2016-05-01 MED ORDER — ASPIRIN EC 325 MG PO TBEC
325.0000 mg | DELAYED_RELEASE_TABLET | Freq: Every day | ORAL | Status: DC
  Administered 2016-05-02 – 2016-05-03 (×2): 325 mg via ORAL
  Filled 2016-05-01 (×2): qty 1

## 2016-05-01 MED ORDER — FENTANYL CITRATE (PF) 250 MCG/5ML IJ SOLN
INTRAMUSCULAR | Status: AC
  Filled 2016-05-01: qty 5

## 2016-05-01 MED ORDER — BUPIVACAINE HCL (PF) 0.25 % IJ SOLN
INTRAMUSCULAR | Status: DC | PRN
  Administered 2016-05-01: 20 mL

## 2016-05-01 MED ORDER — SODIUM CHLORIDE 0.45 % IV SOLN
INTRAVENOUS | Status: DC
  Administered 2016-05-01: 15:00:00 via INTRAVENOUS

## 2016-05-01 MED ORDER — FUROSEMIDE 40 MG PO TABS: 40.0000 mg | ORAL_TABLET | Freq: Every day | ORAL | Status: DC | PRN

## 2016-05-01 MED ORDER — METOCLOPRAMIDE HCL 5 MG PO TABS: 5.0000 mg | ORAL_TABLET | Freq: Three times a day (TID) | ORAL | Status: DC | PRN

## 2016-05-01 MED ORDER — LIDOCAINE 2% (20 MG/ML) 5 ML SYRINGE
INTRAMUSCULAR | Status: AC
Start: 2016-05-01 — End: 2016-05-01
  Filled 2016-05-01: qty 5

## 2016-05-01 MED ORDER — PHENYLEPHRINE HCL 10 MG/ML IJ SOLN
10.0000 mg | INTRAVENOUS | Status: DC | PRN
  Administered 2016-05-01: 40 ug/min via INTRAVENOUS

## 2016-05-01 MED ORDER — PROPOFOL 500 MG/50ML IV EMUL
INTRAVENOUS | Status: DC | PRN
  Administered 2016-05-01: 25 ug/kg/min via INTRAVENOUS

## 2016-05-01 MED ORDER — POLYETHYLENE GLYCOL 3350 17 G PO PACK: 17.0000 g | PACK | Freq: Every day | ORAL | Status: DC | PRN

## 2016-05-01 MED ORDER — ONDANSETRON HCL 4 MG/2ML IJ SOLN: 4.0000 mg | Freq: Four times a day (QID) | INTRAMUSCULAR | Status: DC | PRN

## 2016-05-01 MED ORDER — CARVEDILOL 6.25 MG PO TABS
6.2500 mg | ORAL_TABLET | Freq: Two times a day (BID) | ORAL | Status: DC
  Filled 2016-05-01 (×2): qty 1

## 2016-05-01 SURGICAL SUPPLY — 76 items
APL SKNCLS STERI-STRIP NONHPOA (GAUZE/BANDAGES/DRESSINGS) ×1
BANDAGE ELASTIC 4 VELCRO ST LF (GAUZE/BANDAGES/DRESSINGS) ×2 IMPLANT
BANDAGE ESMARK 6X9 LF (GAUZE/BANDAGES/DRESSINGS) ×1 IMPLANT
BENZOIN TINCTURE PRP APPL 2/3 (GAUZE/BANDAGES/DRESSINGS) ×2 IMPLANT
BLADE SAGITTAL 25.0X1.19X90 (BLADE) ×2 IMPLANT
BLADE SAW SGTL 13X75X1.27 (BLADE) ×2 IMPLANT
BLADE SURG 10 STRL SS (BLADE) ×1 IMPLANT
BLADE SURG 15 STRL LF DISP TIS (BLADE) IMPLANT
BLADE SURG 15 STRL SS (BLADE) ×2
BNDG CMPR 9X6 STRL LF SNTH (GAUZE/BANDAGES/DRESSINGS) ×1
BNDG CMPR MED 10X6 ELC LF (GAUZE/BANDAGES/DRESSINGS) ×1
BNDG ELASTIC 6X10 VLCR STRL LF (GAUZE/BANDAGES/DRESSINGS) ×2 IMPLANT
BNDG ESMARK 6X9 LF (GAUZE/BANDAGES/DRESSINGS) ×2
BOWL SMART MIX CTS (DISPOSABLE) ×2 IMPLANT
CAPT KNEE TOTAL 3 ATTUNE ×1 IMPLANT
CEMENT HV SMART SET (Cement) ×4 IMPLANT
CLSR STERI-STRIP ANTIMIC 1/2X4 (GAUZE/BANDAGES/DRESSINGS) ×4 IMPLANT
COVER SURGICAL LIGHT HANDLE (MISCELLANEOUS) ×2 IMPLANT
CUFF TOURNIQUET SINGLE 34IN LL (TOURNIQUET CUFF) ×2 IMPLANT
CUFF TOURNIQUET SINGLE 44IN (TOURNIQUET CUFF) IMPLANT
DECANTER SPIKE VIAL GLASS SM (MISCELLANEOUS) ×1 IMPLANT
DRAPE ORTHO SPLIT 77X108 STRL (DRAPES) ×4
DRAPE SURG ORHT 6 SPLT 77X108 (DRAPES) ×2 IMPLANT
DRAPE U-SHAPE 47X51 STRL (DRAPES) ×2 IMPLANT
DRSG PAD ABDOMINAL 8X10 ST (GAUZE/BANDAGES/DRESSINGS) ×4 IMPLANT
DURAPREP 26ML APPLICATOR (WOUND CARE) ×2 IMPLANT
ELECT REM PT RETURN 9FT ADLT (ELECTROSURGICAL) ×2
ELECTRODE REM PT RTRN 9FT ADLT (ELECTROSURGICAL) ×1 IMPLANT
FACESHIELD WRAPAROUND (MASK) ×4 IMPLANT
FACESHIELD WRAPAROUND OR TEAM (MASK) ×2 IMPLANT
GAUZE SPONGE 4X4 12PLY STRL (GAUZE/BANDAGES/DRESSINGS) ×4 IMPLANT
GAUZE XEROFORM 5X9 LF (GAUZE/BANDAGES/DRESSINGS) ×2 IMPLANT
GLOVE BIOGEL PI IND STRL 8 (GLOVE) ×2 IMPLANT
GLOVE BIOGEL PI INDICATOR 8 (GLOVE) ×2
GLOVE ORTHO TXT STRL SZ7.5 (GLOVE) ×4 IMPLANT
GOWN STRL REUS W/ TWL LRG LVL3 (GOWN DISPOSABLE) ×1 IMPLANT
GOWN STRL REUS W/ TWL XL LVL3 (GOWN DISPOSABLE) ×1 IMPLANT
GOWN STRL REUS W/TWL 2XL LVL3 (GOWN DISPOSABLE) ×2 IMPLANT
GOWN STRL REUS W/TWL LRG LVL3 (GOWN DISPOSABLE) ×2
GOWN STRL REUS W/TWL XL LVL3 (GOWN DISPOSABLE) ×2
HANDPIECE INTERPULSE COAX TIP (DISPOSABLE) ×2
IMMOBILIZER KNEE 22 UNIV (SOFTGOODS) ×1 IMPLANT
KIT BASIN OR (CUSTOM PROCEDURE TRAY) ×2 IMPLANT
KIT ROOM TURNOVER OR (KITS) ×2 IMPLANT
MANIFOLD NEPTUNE II (INSTRUMENTS) ×2 IMPLANT
MARKER SPHERE PSV REFLC THRD 5 (MARKER) ×8 IMPLANT
NDL HYPO 25GX1X1/2 BEV (NEEDLE) ×1 IMPLANT
NEEDLE 22X1 1/2 (OR ONLY) (NEEDLE) ×1 IMPLANT
NEEDLE HYPO 25GX1X1/2 BEV (NEEDLE) ×2 IMPLANT
NS IRRIG 1000ML POUR BTL (IV SOLUTION) ×2 IMPLANT
PACK TOTAL JOINT (CUSTOM PROCEDURE TRAY) ×2 IMPLANT
PACK UNIVERSAL I (CUSTOM PROCEDURE TRAY) ×2 IMPLANT
PAD ARMBOARD 7.5X6 YLW CONV (MISCELLANEOUS) ×4 IMPLANT
PAD CAST 4YDX4 CTTN HI CHSV (CAST SUPPLIES) ×1 IMPLANT
PADDING CAST COTTON 4X4 STRL (CAST SUPPLIES) ×2
PADDING CAST COTTON 6X4 STRL (CAST SUPPLIES) ×3 IMPLANT
PIN SCHANZ 4MM 130MM (PIN) ×8 IMPLANT
SET HNDPC FAN SPRY TIP SCT (DISPOSABLE) ×1 IMPLANT
SPONGE GAUZE 4X4 12PLY STER LF (GAUZE/BANDAGES/DRESSINGS) ×1 IMPLANT
STAPLER VISISTAT 35W (STAPLE) IMPLANT
SUCTION FRAZIER HANDLE 10FR (MISCELLANEOUS) ×1
SUCTION TUBE FRAZIER 10FR DISP (MISCELLANEOUS) ×1 IMPLANT
SUT VIC AB 0 CT1 27 (SUTURE) ×2
SUT VIC AB 0 CT1 27XBRD ANBCTR (SUTURE) IMPLANT
SUT VIC AB 1 CTX 36 (SUTURE) ×4
SUT VIC AB 1 CTX36XBRD ANBCTR (SUTURE) ×2 IMPLANT
SUT VIC AB 2-0 CT1 27 (SUTURE) ×4
SUT VIC AB 2-0 CT1 TAPERPNT 27 (SUTURE) ×2 IMPLANT
SUT VIC AB 3-0 X1 27 (SUTURE) ×4 IMPLANT
SYR CONTROL 10ML LL (SYRINGE) ×2 IMPLANT
TOWEL OR 17X24 6PK STRL BLUE (TOWEL DISPOSABLE) ×2 IMPLANT
TOWEL OR 17X26 10 PK STRL BLUE (TOWEL DISPOSABLE) ×2 IMPLANT
TRAY CATH 16FR W/PLASTIC CATH (SET/KITS/TRAYS/PACK) ×1 IMPLANT
TUBE CONNECTING 12X1/4 (SUCTIONS) ×1 IMPLANT
WATER STERILE IRR 1000ML POUR (IV SOLUTION) ×2 IMPLANT
YANKAUER SUCT BULB TIP NO VENT (SUCTIONS) ×1 IMPLANT

## 2016-05-01 NOTE — Op Note (Signed)
Kathleen [name], Kathleen [name]              ACCOUNT NO.:  [id number]  MEDICAL RECORD NO.:  [id number]  LOCATION:  [facility]                         FACILITY:  [facility]  PHYSICIAN:  Quoc Tome C. Lorin Mercy, M.D.    DATE OF BIRTH:  1955-11-11  DATE OF PROCEDURE:  05/01/2016 DATE OF DISCHARGE:                              OPERATIVE REPORT   PREOPERATIVE DIAGNOSIS:  Right knee osteoarthritis.  POSTOPERATIVE DIAGNOSIS:  Right knee osteoarthritis.  PROCEDURE:  Right cemented total knee arthroplasty, Attune #5 femur, #5 tibia, 5 poly-rotating platform, and 35 3-pegged patella cemented.  SURGEON:  Shaina Gullatt C. Lorin Mercy, M.D.  ASSISTANT:  Alyson Locket. Ricard Dillon, PA-C, medically necessary and present for the entire procedure.  ANESTHESIA:  Spinal plus local.  DESCRIPTION OF PROCEDURE:  After standard prepping and draping, proximal thigh tourniquet, time-out procedure, clindamycin, impervious stockinette, Coban, split sheets, drapes, sterile skin marker, and Betadine Steri-Drape, a midline incision was made.  Tourniquet was 350. Medial parapatellar incision was made.  Patella was flipped over 10 mm, resected off the patella, spurs removed off the femur.  There were bone- on-bone changes from her primary osteoarthritis.  Meniscal remnants were resected.  It was eburnated bone, tricompartmental.  Femur was cut first resecting 9.  Tibia was cut, spacer block was checked, she was still little bit tight in extension and in flexion, so additional 2 mm were taken off the tibia.  This gave symmetrical flexion/extension gaps. Size 5 on the femur and 5 on the tibia.  Tibial punch was prepared.  Box was cut through the femur and then trials were inserted with full extension, good flexion, collateral ligament balance.  Trial was removed, pulse lavaged after lug nuts had been drilled in the trial on the femoral side.  Cement was mixed.  Tibia cemented first followed by the femur, then with tibia poly insertion.  All excess cement  was removed.  Once cement was hard at 15 minutes, tourniquet deflated. Standard layered closure.  Subcuticular closure on the skin.     [name] C. Lorin Mercy, M.D.     MCY/MEDQ  D:  05/01/2016  T:  05/01/2016  Job:  [id number]

## 2016-05-01 NOTE — Transfer of Care (Signed)
Immediate Anesthesia Transfer of Care Note  Patient: COURTLAND MELLIN  Procedure(s) Performed: Procedure(s):  TOTAL KNEE ARTHROPLASTY (Right)  Patient Location: PACU  Anesthesia Type:Spinal  Level of Consciousness: awake, alert , oriented and patient cooperative  Airway & Oxygen Therapy: Patient Spontanous Breathing and Patient connected to nasal cannula oxygen  Post-op Assessment: Report given to RN and Post -op Vital signs reviewed and stable  Post vital signs: Reviewed and stable  Last Vitals:  Filed Vitals:   05/01/16 0607 05/01/16 0609  BP: 196/80 217/86  Pulse:    Resp:      Last Pain:  Filed Vitals:   05/01/16 0610  PainSc: 3       Patients Stated Pain Goal: 2 ([id number])  Complications: No apparent anesthesia complications

## 2016-05-01 NOTE — Progress Notes (Signed)
Pt admitted to the unit from PACU. Pt is stable, alert and oriented per baseline. Oriented to room, staff, and call bell. Educated to call for any assistance. Bed in lowest position, call bell within reach- will continue to monitor.

## 2016-05-01 NOTE — Anesthesia Procedure Notes (Signed)
Spinal Patient location during procedure: OR Start time: 05/01/2016 7:36 AM End time: 05/01/2016 7:41 AM Staffing Anesthesiologist: Roderic Palau Performed by: anesthesiologist  Preanesthetic Checklist Completed: patient identified, surgical consent, pre-op evaluation, timeout performed, IV checked, risks and benefits discussed and monitors and equipment checked Spinal Block Patient position: sitting Prep: Betadine Patient monitoring: cardiac monitor, continuous pulse ox and blood pressure Approach: midline Location: L3-4 Injection technique: single-shot Needle Needle type: Pencan  Needle gauge: 24 G Needle length: 9 cm Assessment Sensory level: T6 Additional Notes Functioning IV was confirmed and monitors were applied. Sterile prep and drape, including hand hygiene and sterile gloves were used. The patient was positioned and the spine was prepped. The skin was anesthetized with lidocaine.  Free flow of clear CSF was obtained prior to injecting local anesthetic into the CSF.  The spinal needle aspirated freely following injection.  The needle was carefully withdrawn.  The patient tolerated the procedure well.

## 2016-05-01 NOTE — Interval H&P Note (Signed)
History and Physical Interval Note:  05/01/2016 7:27 AM  Kathleen [name]  has presented today for surgery, with the diagnosis of Right Knee Osteoarthritis  The various methods of treatment have been discussed with the patient and family. After consideration of risks, benefits and other options for treatment, the patient has consented to  Procedure(s): COMPUTER ASSISTED TOTAL KNEE ARTHROPLASTY (Right) as a surgical intervention .  The patient's history has been reviewed, patient examined, no change in status, stable for surgery.  I have reviewed the patient's chart and labs.  Questions were answered to the patient's satisfaction.     Stony Stegmann C

## 2016-05-01 NOTE — Brief Op Note (Signed)
05/01/2016  10:41 AM  PATIENT:  Kathleen [name]  60 y.o. female  PRE-OPERATIVE DIAGNOSIS:  Right Knee Osteoarthritis  POST-OPERATIVE DIAGNOSIS:  Right Knee Osteoarthritis  PROCEDURE:  Procedure(s):  TOTAL KNEE ARTHROPLASTY (Right)  SURGEON:  Surgeon(s) and Role:    * Marybelle Killings, MD - Primary  PHYSICIAN ASSISTANT: Mckinley Adelstein m. Ricard Dillon   ANESTHESIA:   Spinal   EBL:  Total I/O In: 1800 [I.V.:1800] Out: [id number] [Urine:1000; Blood:15]  BLOOD ADMINISTERED:none  DRAINS: none   LOCAL MEDICATIONS USED:  MARCAINE/exparel   SPECIMEN:  No Specimen  DISPOSITION OF SPECIMEN:  N/A  COUNTS:  YES  TOURNIQUET:   Total Tourniquet Time Documented: Thigh (Right) - 57 minutes Total: Thigh (Right) - 57 minutes   DICTATION: .Viviann Spare Dictation  PLAN OF CARE: Admit to inpatient   PATIENT DISPOSITION:  PACU - hemodynamically stable.

## 2016-05-01 NOTE — Progress Notes (Signed)
Orthopedic Tech Progress Note Patient Details:  Kathleen [name] Aug 22, 1956 [id number]  CPM Right Knee CPM Right Knee: On Right Knee Flexion (Degrees): 90 Right Knee Extension (Degrees): 0 Additional Comments: trapeze bar patient helper Viewed order from doctor's order list  Hildred Priest 05/01/2016, 11:34 AM

## 2016-05-01 NOTE — Anesthesia Postprocedure Evaluation (Signed)
Anesthesia Post Note  Patient: Kathleen [name]  Procedure(s) Performed: Procedure(s) (LRB):  TOTAL KNEE ARTHROPLASTY (Right)  Patient location during evaluation: PACU Anesthesia Type: Spinal and MAC Level of consciousness: awake and alert Pain management: pain level controlled Vital Signs Assessment: post-procedure vital signs reviewed and stable Respiratory status: spontaneous breathing and respiratory function stable Cardiovascular status: blood pressure returned to baseline and stable Postop Assessment: spinal receding Anesthetic complications: no    Last Vitals:  Filed Vitals:   05/01/16 1112 05/01/16 1130  BP: 124/53 91/63  Pulse: 58 58  Temp:    Resp: 14 12    Last Pain:  Filed Vitals:   05/01/16 1143  PainSc: 0-No pain                 Ladislav Caselli,W. EDMOND

## 2016-05-01 NOTE — Evaluation (Signed)
Physical Therapy Evaluation Patient Details Name: Kathleen [name] MRN: [id number] DOB: Apr 02, 1956 Today's Date: 05/01/2016   History of Present Illness  Pt is a 60 y/o F s/p Rt TKA.  Pt's PMH includes chronic back pain, obesity, cancer.  Clinical Impression  Pt is s/p Rt TKA resulting in the deficits listed below (see PT Problem List).  Ms. Vandersluis was very pleasant and agreeable to therapy.  She requires min guard assist for sit<>stand transfers and supervision while ambulating 175 ft using RW.  She is planning to stay w/ her friend, Maudie Mercury, at d/c who will be available to provide 24/7 assist/supervision.  Pt will benefit from skilled PT to increase their independence and safety with mobility to allow discharge to the venue listed below.     Follow Up Recommendations Home health PT;Supervision for mobility/OOB    Equipment Recommendations  Rolling walker with 5" wheels;3in1 (PT)    Recommendations for Other Services OT consult     Precautions / Restrictions Precautions Precautions: Knee;Fall Precaution Booklet Issued: Yes (comment) Precaution Comments: Reviewed no pillow under knees Required Braces or Orthoses: Knee Immobilizer - Right Knee Immobilizer - Right: On except when in CPM Restrictions Weight Bearing Restrictions: Yes RLE Weight Bearing: Weight bearing as tolerated      Mobility  Bed Mobility Overal bed mobility: Needs Assistance Bed Mobility: Supine to Sit     Supine to sit: Supervision;HOB elevated     General bed mobility comments: Supervision for safety.  Pt uses bed rail.  Transfers Overall transfer level: Needs assistance Equipment used: Rolling walker (2 wheeled) Transfers: Sit to/from Stand Sit to Stand: Min guard         General transfer comment: Cues for hand placement and technique using RW as well as to keep it with her when turning to sit.    Ambulation/Gait Ambulation/Gait assistance: Supervision Ambulation Distance (Feet): 175  Feet Assistive device: Rolling walker (2 wheeled) Gait Pattern/deviations: Step-through pattern;Decreased stride length;Decreased weight shift to right;Antalgic;Trunk flexed   Gait velocity interpretation: Below normal speed for age/gender General Gait Details: Cues for upright posture and supervision for safety.  Stairs            Wheelchair Mobility    Modified Rankin (Stroke Patients Only)       Balance Overall balance assessment: Needs assistance Sitting-balance support: No upper extremity supported;Feet supported Sitting balance-Leahy Scale: Good     Standing balance support: During functional activity;Single extremity supported Standing balance-Leahy Scale: Poor Standing balance comment: Relies on support of at least 1 UE for static standing                             Pertinent Vitals/Pain Pain Assessment: 0-10 Pain Score: 9  Pain Location: Rt knee Pain Descriptors / Indicators: Aching;Grimacing Pain Intervention(s): Limited activity within patient's tolerance;Monitored during session;Repositioned;Patient requesting pain meds-RN notified    Home Living Family/patient expects to be discharged to:: Private residence Living Arrangements: Alone Available Help at Discharge: Friend(s);Available 24 hours/day (will be staying at her friend's, Maudie Mercury, house) Type of Home: House Home Access: Level entry     Home Layout: One level Home Equipment: Cane - single point      Prior Function Level of Independence: Independent with assistive device(s)         Comments: Was using cane to ambulate.  Working full time, driving, Ind w/ all ADLs     Hand Dominance  Extremity/Trunk Assessment   Upper Extremity Assessment: Defer to OT evaluation           Lower Extremity Assessment: RLE deficits/detail RLE Deficits / Details: limited ROM and strength as expected s/p Rt TKA       Communication   Communication: No difficulties  Cognition  Arousal/Alertness: Awake/alert Behavior During Therapy: WFL for tasks assessed/performed Overall Cognitive Status: Within Functional Limits for tasks assessed                      General Comments      Exercises Total Joint Exercises Ankle Circles/Pumps: AROM;Both;10 reps;Supine Quad Sets: Strengthening;Both;5 reps;Seated Heel Slides: AROM;Right;5 reps;Supine Straight Leg Raises: AROM;Right;Limitations Straight Leg Raises Limitations: limited to 3 reps due to pain; no extension lag Goniometric ROM: Rt knee AROM w/ heel slide ~70 deg      Assessment/Plan    PT Assessment Patient needs continued PT services  PT Diagnosis Difficulty walking;Abnormality of gait;Acute pain   PT Problem List Decreased strength;Decreased range of motion;Decreased activity tolerance;Decreased balance;Decreased mobility;Decreased knowledge of use of DME;Decreased safety awareness;Decreased knowledge of precautions;Pain;Obesity  PT Treatment Interventions DME instruction;Gait training;Functional mobility training;Therapeutic activities;Therapeutic exercise;Balance training;Neuromuscular re-education;Patient/family education;Modalities   PT Goals (Current goals can be found in the Care Plan section) Acute Rehab PT Goals Patient Stated Goal: to get stronger and regain independence PT Goal Formulation: With patient Time For Goal Achievement: 05/08/16 Potential to Achieve Goals: Good    Frequency 7X/week   Barriers to discharge        Co-evaluation               End of Session Equipment Utilized During Treatment: Gait belt;Right knee immobilizer Activity Tolerance: Patient tolerated treatment well;Patient limited by pain Patient left: in chair;with call bell/phone within reach;with chair alarm set;with family/visitor present Nurse Communication: Mobility status;Patient requests pain meds;Weight bearing status;Precautions         Time: [id number] PT Time Calculation (min) (ACUTE  ONLY): 30 min   Charges:   PT Evaluation $PT Eval Low Complexity: 1 Procedure PT Treatments $Gait Training: 8-22 mins   PT G Codes:        Collie Siad PT, DPT  Pager: [phone] Phone: [phone] 05/01/2016, 4:52 PM

## 2016-05-02 LAB — CBC
HCT: 38.7 % (ref 36.0–46.0)
Hemoglobin: 12.3 g/dL (ref 12.0–15.0)
MCH: 28.6 pg (ref 26.0–34.0)
MCHC: 31.8 g/dL (ref 30.0–36.0)
MCV: 90 fL (ref 78.0–100.0)
PLATELETS: 307 10*3/uL (ref 150–400)
RBC: 4.3 MIL/uL (ref 3.87–5.11)
RDW: 14.1 % (ref 11.5–15.5)
WBC: 17 10*3/uL — AB (ref 4.0–10.5)

## 2016-05-02 LAB — BASIC METABOLIC PANEL
ANION GAP: 8 (ref 5–15)
BUN: 20 mg/dL (ref 6–20)
CALCIUM: 9.6 mg/dL (ref 8.9–10.3)
CO2: 25 mmol/L (ref 22–32)
CREATININE: 0.93 mg/dL (ref 0.44–1.00)
Chloride: 105 mmol/L (ref 101–111)
Glucose, Bld: 107 mg/dL — ABNORMAL HIGH (ref 65–99)
Potassium: 4.5 mmol/L (ref 3.5–5.1)
SODIUM: 138 mmol/L (ref 135–145)

## 2016-05-02 MED ORDER — ONDANSETRON HCL 4 MG PO TABS: 4.0000 mg | ORAL_TABLET | Freq: Three times a day (TID) | ORAL | Status: DC | PRN

## 2016-05-02 MED ORDER — METHOCARBAMOL 750 MG PO TABS: 750.0000 mg | ORAL_TABLET | Freq: Two times a day (BID) | ORAL | Status: DC | PRN

## 2016-05-02 MED ORDER — SENNOSIDES-DOCUSATE SODIUM 8.6-50 MG PO TABS: 1.0000 | ORAL_TABLET | Freq: Every evening | ORAL | Status: DC | PRN

## 2016-05-02 MED ORDER — OXYCODONE-ACETAMINOPHEN 5-325 MG PO TABS: 1.0000 | ORAL_TABLET | ORAL | Status: DC | PRN

## 2016-05-02 MED ORDER — ASPIRIN EC 325 MG PO TBEC: 325.0000 mg | DELAYED_RELEASE_TABLET | Freq: Every day | ORAL | Status: DC

## 2016-05-02 NOTE — Progress Notes (Signed)
   Subjective:  Patient reports pain as severe but controlled with pain meds.  Objective:   VITALS:   Filed Vitals:   05/01/16 1423 05/01/16 2100 05/02/16 0117 05/02/16 0408  BP: 129/69 137/74 98/32 147/51  Pulse: 90 72 64 62  Temp: 97.7 F (36.5 C) 98.4 F (36.9 C) 98.5 F (36.9 C) 98 F (36.7 C)  TempSrc:  Oral Oral Oral  Resp:  17 18 18   Height:      Weight:      SpO2: 96% 97% 94% 98%    Neurologically intact Neurovascular intact Sensation intact distally Intact pulses distally Dorsiflexion/Plantar flexion intact Incision: dressing C/D/I and no drainage No cellulitis present Compartment soft   Lab Results  Component Value Date   WBC 9.4 04/17/2016   HGB 15.2* 04/17/2016   HCT 46.2* 04/17/2016   MCV 88.3 04/17/2016   PLT 281 04/17/2016     Assessment/Plan:  1 Day Post-Op   - Expected postop acute blood loss anemia - will monitor for symptoms - Up with PT/OT - DVT ppx - SCDs, ambulation, aspirin - WBAT operative extremity - Pain control - Discharge planning - she is highly motivated to work with PT, anticipate dc home sunday  Marianna Payment 05/02/2016, 8:18 AM [phone]

## 2016-05-02 NOTE — Progress Notes (Signed)
Physical Therapy Treatment Patient Details Name: Kathleen [name] MRN: [id number] DOB: [birth year] Today's Date: 05/02/2016    History of Present Illness Pt is a 60 y/o F s/p Rt TKA.  Pt's PMH includes chronic back pain, obesity, cancer.    PT Comments    Stair education completed this session. Pt continues to make steady progress toward goals.  Follow Up Recommendations  Home health PT;Supervision for mobility/OOB     Equipment Recommendations  Rolling walker with 5" wheels;3in1 (PT)    Recommendations for Other Services OT consult     Precautions / Restrictions Precautions Precautions: Knee;Fall Precaution Comments: Reviewed no pillow under knees Required Braces or Orthoses: Knee Immobilizer - Right Knee Immobilizer - Right: On except when in CPM Restrictions RLE Weight Bearing: Weight bearing as tolerated    Mobility  Bed Mobility Overal bed mobility: Needs Assistance       Supine to sit: Min assist     General bed mobility comments: pt in recliner before and after session  Transfers Overall transfer level: Needs assistance Equipment used: Rolling walker (2 wheeled) Transfers: Sit to/from Stand Sit to Stand: Supervision         General transfer comment: demo'd safe technique  Ambulation/Gait Ambulation/Gait assistance: Supervision Ambulation Distance (Feet): 400 Feet Assistive device: Rolling walker (2 wheeled) Gait Pattern/deviations: Step-through pattern;Decreased stride length;Antalgic;Decreased stance time - right;Decreased weight shift to right Gait velocity: decreased Gait velocity interpretation: at or above normal speed for age/gender General Gait Details: occasional cues for posture. gait devaitions also due to [facility] limiting knee flexion for natural gait pattern.   Stairs Stairs: Yes Stairs assistance: Min guard Stair Management: No rails;Step to pattern;Forwards;With walker Number of Stairs: 1 General stair comments: demo'd technique before  pt returned demo with minimal cues needed      Cognition Arousal/Alertness: Awake/alert Behavior During Therapy: WFL for tasks assessed/performed Overall Cognitive Status: Within Functional Limits for tasks assessed        Exercises Total Joint Exercises Ankle Circles/Pumps: AROM;Both;5 reps;Seated Quad Sets: AROM;Strengthening;Right;5 reps;Seated Short Arc Quad: Strengthening;AAROM;Right;5 reps;Supine Heel Slides: AAROM;Strengthening;Right;10 reps;Supine Straight Leg Raises: AAROM;Strengthening;Right;5 reps;Seated Straight Leg Raises Limitations: supine right knee AROM 70 degrees flexion     Pertinent Vitals/Pain Pain Assessment: 0-10 Pain Score: 6  Pain Location: right knee Pain Descriptors / Indicators: Aching;Sore Pain Intervention(s): Limited activity within patient's tolerance;Monitored during session;Premedicated before session;Repositioned     PT Goals (current goals can now be found in the care plan section) Acute Rehab PT Goals Patient Stated Goal: to get stronger and regain independence PT Goal Formulation: With patient Time For Goal Achievement: 05/08/16 Potential to Achieve Goals: Good Progress towards PT goals: Progressing toward goals    Frequency  7X/week    PT Plan Current plan remains appropriate    End of Session Equipment Utilized During Treatment: Gait belt;Right knee immobilizer Activity Tolerance: Patient tolerated treatment well;Patient limited by pain Patient left: in chair;with call bell/phone within reach;with chair alarm set;with family/visitor present     Time: 1500-1520 PT Time Calculation (min) (ACUTE ONLY): 20 min  Charges:  $Gait Training: 8-22 mins $Therapeutic Exercise: 8-22 mins           Willow Ora 05/02/2016, 3:24 PM   Willow Ora, PTA, CLT Acute Rehab Services [phone] 05/02/2016, 3:25 PM

## 2016-05-02 NOTE — Progress Notes (Signed)
Orthopedic Tech Progress Note Patient Details:  Kathleen [name] February 27, 1956 [id number]  CPM Right Knee CPM Right Knee: On Right Knee Flexion (Degrees): 90 Right Knee Extension (Degrees): 0 Additional Comments: trapeze bar patient helper   [place] Pink 05/02/2016, 3:09 PM

## 2016-05-02 NOTE — Care Management Note (Signed)
Case Management Note  Patient Details  Name: KEYONNA COMUNALE MRN: [id number] Date of Birth: Mar 05, 1956  Subjective/Objective: 60 yo s/p R TKA   Action/Plan: received referral to assist with [place] needs and DME   Expected Discharge Date:  05/04/16               Expected Discharge Plan:  [place]  In-House Referral:     Discharge planning Services  CM Consult  Post Acute Care Choice:  Home Health Choice offered to:  Patient  DME Arranged:  3-N-1, Walker rolling DME Agency:  [place]:  PT [facility] Agency:  [facility]  Status of Service:  Completed, signed off  If discussed at [place] of Stay Meetings, dates discussed:    Additional Comments: met with pt at beside. She lives with her daughter who is 49 yo. She is planning to stay with a friend after d/c from the hospital. Discussed HHPT and DME. She doesn't have a preference for a [place] agency. Provided pt with a list of [place] agencies. She chose [facility]. She needs a RW and a 3-in-1 BSC. Contacted Jeneen Rinks and Tiffany at [facility] for referrals. Friend's address is [address], [place], [place] - cell phone [phone].  Norina Buzzard, RN 05/02/2016, 11:34 AM

## 2016-05-02 NOTE — Progress Notes (Signed)
Occupational Therapy Evaluation Patient Details Name: Kathleen [name] MRN: [id number] DOB: Feb 14, 1956 Today's Date: 05/02/2016    History of Present Illness Pt is a 60 y/o F s/p Rt TKA.  Pt's PMH includes chronic back pain, obesity, cancer.   Clinical Impression   Making excellent progress. S with mobility. Completed education regarding compensatory techniques for ADL and functional mobility for ADL @ RW. Pt safe to D/C tomorrow from OT standpoint. OT signing off.     Follow Up Recommendations  No OT follow up;Supervision - Intermittent    Equipment Recommendations  3 in 1 bedside comode    Recommendations for Other Services       Precautions / Restrictions Precautions Precautions: Knee;Fall Precaution Comments: Reviewed no pillow under knees Required Braces or Orthoses: Knee Immobilizer - Right Knee Immobilizer - Right: On except when in CPM Restrictions RLE Weight Bearing: Weight bearing as tolerated      Mobility Bed Mobility Overal bed mobility: Needs Assistance       Supine to sit: Min assist     General bed mobility comments: OOB in chair  Transfers Overall transfer level: Needs assistance Equipment used: Rolling walker (2 wheeled) Transfers: Sit to/from Stand Sit to Stand: Supervision         General transfer comment: demo'd safe technique    Balance     Sitting balance-Leahy Scale: Normal       Standing balance-Leahy Scale: Fair                              ADL Overall ADL's : Needs assistance/impaired     Grooming: Set up;Sitting   Upper Body Bathing: Set up;Sitting   Lower Body Bathing: Minimal assistance;Sit to/from stand   Upper Body Dressing : Set up;Sitting   Lower Body Dressing: Minimal assistance;Sit to/from stand   Toilet Transfer: Supervision/safety;BSC;Ambulation;RW   Toileting- Water quality scientist and Hygiene: Modified independent   Tub/ Banker: Supervision/safety;3 in 1;Ambulation;Walk-in  shower;Rolling walker Tub/Shower Transfer Details (indicate cue type and reason): Educated on technique for shower trasnfer. Able to return demonstrate. Functional mobility during ADLs: Supervision/safety;Rolling walker General ADL Comments: Educated on compensatory technqieus for LB ADL and reducing risk of falls at home with proper set up; use of walker bag/ positioning of items at counter top height/removal of throw rugs.     Vision     Perception     Praxis      Pertinent Vitals/Pain Pain Assessment: 0-10 Pain Score: 5  Pain Location: R knee Pain Descriptors / Indicators: Aching;Sore Pain Intervention(s): Limited activity within patient's tolerance;Repositioned;Ice applied     Hand Dominance Right   Extremity/Trunk Assessment Upper Extremity Assessment Upper Extremity Assessment: Overall WFL for tasks assessed   Lower Extremity Assessment Lower Extremity Assessment: Defer to PT evaluation   Cervical / Trunk Assessment Cervical / Trunk Assessment: Normal   Communication Communication Communication: No difficulties   Cognition Arousal/Alertness: Awake/alert Behavior During Therapy: WFL for tasks assessed/performed Overall Cognitive Status: Within Functional Limits for tasks assessed                     General Comments       Exercises       Shoulder Instructions      Home Living Family/patient expects to be discharged to:: Private residence Living Arrangements: Children Available Help at Discharge: Family;Friend(s);Available 24 hours/day Type of Home: House Home Access: Level entry     Home Layout:  One level     Bathroom Shower/Tub: Walk-in shower;Door   [employer] Toilet: Standard Bathroom Accessibility: Yes How Accessible: Accessible via walker Home Equipment: Cane - single point          Prior Functioning/Environment Level of Independence: Independent with assistive device(s)             OT Diagnosis: Generalized weakness;Acute  pain   OT Problem List: Decreased strength;Decreased range of motion;Decreased activity tolerance;Decreased knowledge of use of DME or AE;Obesity;Pain   OT Treatment/Interventions:      OT Goals(Current goals can be found in the care plan section) Acute Rehab OT Goals Patient Stated Goal: to get stronger and regain independence OT Goal Formulation: All assessment and education complete, DC therapy  OT Frequency:     Barriers to D/C:            Co-evaluation              End of Session Equipment Utilized During Treatment: Gait belt;Rolling walker CPM Right Knee CPM Right Knee: Off Right Knee Flexion (Degrees): 90 Right Knee Extension (Degrees): 0 Nurse Communication: Mobility status  Activity Tolerance: Patient tolerated treatment well Patient left: in chair;with call bell/phone within reach;with family/visitor present   Time: [id number] OT Time Calculation (min): 24 min Charges:  OT General Charges $OT Visit: 1 Procedure OT Evaluation $OT Eval Low Complexity: 1 Procedure OT Treatments $Self Care/Home Management : 8-22 mins G-Codes:    Kinya Meine,HILLARY May 24, 2016, 5:04 PM   [facility], OTR/L  [phone] May 24, 2016

## 2016-05-02 NOTE — Progress Notes (Signed)
Physical Therapy Treatment Patient Details Name: Kathleen [name] MRN: [id number] DOB: 06-08-1956 Today's Date: 05/02/2016    History of Present Illness Pt is a 60 y/o F s/p Rt TKA.  Pt's PMH includes chronic back pain, obesity, cancer.    PT Comments    Pt is making steady progress toward goals with increased activity and less overall assistance needed with mobility.   Follow Up Recommendations  Home health PT;Supervision for mobility/OOB     Equipment Recommendations  Rolling walker with 5" wheels;3in1 (PT)    Recommendations for Other Services OT consult     Precautions / Restrictions Precautions Precautions: Knee;Fall Precaution Comments: Reviewed no pillow under knees Required Braces or Orthoses: Knee Immobilizer - Right Knee Immobilizer - Right: On except when in CPM Restrictions RLE Weight Bearing: Weight bearing as tolerated    Mobility  Bed Mobility Overal bed mobility: Needs Assistance       Supine to sit: Min assist     General bed mobility comments: bed flat and no rail used. assist to manage right leg only with cues for sequencing.  Transfers Overall transfer level: Needs assistance Equipment used: Rolling walker (2 wheeled) Transfers: Sit to/from Stand Sit to Stand: Min guard         General transfer comment: cues for hand and right leg placement with transfers  Ambulation/Gait Ambulation/Gait assistance: Supervision Ambulation Distance (Feet): 220 Feet Assistive device: Rolling walker (2 wheeled) Gait Pattern/deviations: Step-through pattern;Antalgic;Trunk flexed;Decreased stride length;Decreased stance time - right;Decreased weight shift to right Gait velocity: decreased Gait velocity interpretation: Below normal speed for age/gender General Gait Details: cues for upright posture and to correct gait deviations       Cognition Arousal/Alertness: Awake/alert Behavior During Therapy: WFL for tasks assessed/performed Overall Cognitive  Status: Within Functional Limits for tasks assessed                      Exercises Total Joint Exercises Ankle Circles/Pumps: AROM;Both;10 reps;Supine Quad Sets: AROM;Right;10 reps;Supine Short Arc Quad: Strengthening;AAROM;Right;5 reps;Supine Heel Slides: AAROM;Strengthening;Right;10 reps;Supine Straight Leg Raises: AAROM;Strengthening;Right;10 reps;Supine Straight Leg Raises Limitations: supine right knee AROM 70 degrees flexion     Pertinent Vitals/Pain Pain Assessment: 0-10 Pain Score: 8  Pain Location: right knee Pain Descriptors / Indicators: Aching;Grimacing;Operative site guarding;Sore Pain Intervention(s): Limited activity within patient's tolerance;Monitored during session;Premedicated before session;Repositioned     PT Goals (current goals can now be found in the care plan section) Acute Rehab PT Goals Patient Stated Goal: to get stronger and regain independence PT Goal Formulation: With patient Time For Goal Achievement: 05/08/16 Potential to Achieve Goals: Good Progress towards PT goals: Progressing toward goals    Frequency  7X/week    PT Plan Current plan remains appropriate    End of Session Equipment Utilized During Treatment: Gait belt;Right knee immobilizer Activity Tolerance: Patient tolerated treatment well;Patient limited by pain Patient left: in chair;with call bell/phone within reach;with chair alarm set;with family/visitor present     Time: [id number] PT Time Calculation (min) (ACUTE ONLY): 25 min  Charges:  $Gait Training: 8-22 mins $Therapeutic Exercise: 8-22 mins           Willow Ora 05/02/2016, 1:46 PM   Willow Ora, PTA, CLT Acute Rehab Services [phone] 05/02/2016, 1:47 PM

## 2016-05-03 LAB — BASIC METABOLIC PANEL
ANION GAP: 5 (ref 5–15)
BUN: 22 mg/dL — ABNORMAL HIGH (ref 6–20)
CO2: 26 mmol/L (ref 22–32)
Calcium: 9.1 mg/dL (ref 8.9–10.3)
Chloride: 108 mmol/L (ref 101–111)
Creatinine, Ser: 0.95 mg/dL (ref 0.44–1.00)
Glucose, Bld: 129 mg/dL — ABNORMAL HIGH (ref 65–99)
POTASSIUM: 4.2 mmol/L (ref 3.5–5.1)
SODIUM: 139 mmol/L (ref 135–145)

## 2016-05-03 LAB — CBC
HEMATOCRIT: 34.7 % — AB (ref 36.0–46.0)
HEMOGLOBIN: 11 g/dL — AB (ref 12.0–15.0)
MCH: 28.8 pg (ref 26.0–34.0)
MCHC: 31.7 g/dL (ref 30.0–36.0)
MCV: 90.8 fL (ref 78.0–100.0)
Platelets: 249 10*3/uL (ref 150–400)
RBC: 3.82 MIL/uL — ABNORMAL LOW (ref 3.87–5.11)
RDW: 14.4 % (ref 11.5–15.5)
WBC: 11.1 10*3/uL — ABNORMAL HIGH (ref 4.0–10.5)

## 2016-05-03 NOTE — Progress Notes (Signed)
Orthopedic Tech Progress Note Patient Details:  [name] January 31, 1956 [id number]  Patient ID: [name], female   DOB: 1955/11/24, 60 y.o.   MRN: [id number] Pt was already up in chair and didn't want to get in cpm now. Will call when ready.  Karolee Stamps 05/03/2016, 5:55 AM

## 2016-05-03 NOTE — Progress Notes (Signed)
Discharge instructions gave to pt and all questions answered. Pt received her paper prescriptions from this RN and equipments delivered to pt room. She is ready to go.

## 2016-05-03 NOTE — Discharge Summary (Signed)
Physician Discharge Summary      Patient ID: Kathleen [name] MRN: [id number] DOB/AGE: 1956/10/26 60 y.o.  Admit date: 05/01/2016 Discharge date: 05/03/2016  Admission Diagnoses:  <principal problem not specified>  Discharge Diagnoses:  Active Problems:   Status post total right knee replacement   Past Medical History  Diagnosis Date  . Chronic back pain   . Hx of thyroid cancer   . Obesity   . Sleep apnea, obstructive     possible  . Tobacco abuse   . Coronary artery disease 2012    Mild to Moderate  . Hyperlipidemia   . Hypertension   . Hypothyroidism   . Complication of anesthesia     Woke up during  Hysterectomy  . PONV (postoperative nausea and vomiting)     with thyroid surgery  . Family history of adverse reaction to anesthesia   . Heart murmur     "nothing to be concerned about  . Shortness of breath dyspnea     with exertion  . Cancer ([place])   . GERD (gastroesophageal reflux disease)     omeprazole prn  . Arthritis   . History of blood transfusion     Surgeries: Procedure(s):  TOTAL KNEE ARTHROPLASTY on 05/01/2016   Consultants (if any):    Discharged Condition: Improved  Hospital Course: Kathleen [name] is an 60 y.o. female who was admitted 05/01/2016 with a diagnosis of <principal problem not specified> and went to the operating room on 05/01/2016 and underwent the above named procedures.    She was given perioperative antibiotics:  Anti-infectives    Start     Dose/Rate Route Frequency Ordered Stop   05/01/16 1500  clindamycin (CLEOCIN) IVPB 600 mg     600 mg 100 mL/hr over 30 Minutes Intravenous Every 6 hours 05/01/16 1353 05/01/16 2037   05/01/16 0600  clindamycin (CLEOCIN) IVPB 900 mg     900 mg 100 mL/hr over 30 Minutes Intravenous To ShortStay Surgical 04/30/16 1044 05/01/16 0808    .  She was given sequential compression devices, early ambulation, and aspirin for DVT prophylaxis.  She benefited maximally from the hospital stay and  there were no complications.    Recent vital signs:  Filed Vitals:   05/02/16 2016 05/03/16 0519  BP: 127/43 127/40  Pulse: 69 70  Temp: 98.9 F (37.2 C) 98.8 F (37.1 C)  Resp: 18 18    Recent laboratory studies:  Lab Results  Component Value Date   HGB 11.0* 05/03/2016   HGB 12.3 05/02/2016   HGB 15.2* 04/17/2016   Lab Results  Component Value Date   WBC 11.1* 05/03/2016   PLT 249 05/03/2016   Lab Results  Component Value Date   INR 1.04 04/17/2016   Lab Results  Component Value Date   NA 139 05/03/2016   K 4.2 05/03/2016   CL 108 05/03/2016   CO2 26 05/03/2016   BUN 22* 05/03/2016   CREATININE 0.95 05/03/2016   GLUCOSE 129* 05/03/2016    Discharge Medications:     Medication List    STOP taking these medications        aspirin 81 MG tablet  Replaced by:  aspirin EC 325 MG tablet      TAKE these medications        aspirin EC 325 MG tablet  Take 1 tablet (325 mg total) by mouth daily.     methocarbamol 750 MG tablet  Commonly known as:  ROBAXIN  Take 1 tablet (  750 mg total) by mouth 2 (two) times daily as needed for muscle spasms.     ondansetron 4 MG tablet  Commonly known as:  ZOFRAN  Take 1-2 tablets (4-8 mg total) by mouth every 8 (eight) hours as needed for nausea or vomiting.     oxyCODONE-acetaminophen 5-325 MG tablet  Commonly known as:  PERCOCET  Take 1-2 tablets by mouth every 4 (four) hours as needed for severe pain.     senna-docusate 8.6-50 MG tablet  Commonly known as:  SENOKOT S  Take 1 tablet by mouth at bedtime as needed.      ASK your doctor about these medications        amLODipine 10 MG tablet  Commonly known as:  NORVASC  TAKE 1 TABLET BY MOUTH ONCE DAILY     busPIRone 7.5 MG tablet  Commonly known as:  BUSPAR  Take 7.5 mg by mouth 2 (two) times daily as needed.     carisoprodol 350 MG tablet  Commonly known as:  SOMA  Take 350 mg by mouth 4 (four) times daily as needed.     carvedilol 6.25 MG tablet    Commonly known as:  COREG  TAKE 1 TABLET BY MOUTH TWICE DAILY     fluocinonide cream 0.05 %  Commonly known as:  LIDEX  Apply 1 application topically as needed (psoriasis).     furosemide 40 MG tablet  Commonly known as:  LASIX  Take 40 mg by mouth daily as needed for fluid.     HYDROcodone-acetaminophen 10-325 MG tablet  Commonly known as:  NORCO  Take 1 tablet by mouth every 4 (four) hours as needed for moderate pain.     levothyroxine 200 MCG tablet  Commonly known as:  SYNTHROID, LEVOTHROID  Take by mouth daily. Brand only     lisinopril 20 MG tablet  Commonly known as:  PRINIVIL,ZESTRIL  Take 1 tablet (20 mg total) by mouth daily.     mometasone-formoterol 100-5 MCG/ACT Aero  Commonly known as:  DULERA  Inhale 2 puffs into the lungs as needed for wheezing or shortness of breath.     naproxen sodium 220 MG tablet  Commonly known as:  ANAPROX  Take 440 mg by mouth 2 (two) times daily as needed.     omeprazole 40 MG capsule  Commonly known as:  PRILOSEC  Take 40 mg by mouth daily as needed.     rosuvastatin 5 MG tablet  Commonly known as:  CRESTOR  Take 1 tablet (5 mg total) by mouth daily.        Diagnostic Studies: Dg Chest 2 View  04/17/2016  CLINICAL DATA:  Preoperative evaluation for knee surgery. Hypertension EXAM: CHEST  2 VIEW COMPARISON:  Chest radiograph February 10, 2005; chest CT Mar 28, 2015 FINDINGS: There is a calcified granuloma in the right upper lobe. There is no edema or consolidation. Heart size and pulmonary vascularity normal. No adenopathy. There is mild degenerative change in the thoracic spine. IMPRESSION: No edema or consolidation.  Calcified granuloma right upper lobe. Electronically Signed   By: Lowella Grip III M.D.   On: 04/17/2016 11:49    Disposition: 01-Home or Self Care        Follow-up Information    Follow up with YATES,MARK C, MD In 2 weeks.   Specialty:  Orthopedic Surgery   Why:  For suture removal, For wound re-check    Contact information:   [address] [place] [address] [phone]  Signed: Marianna Payment 05/03/2016, 8:35 AM

## 2016-05-03 NOTE — Progress Notes (Signed)
   Subjective:  Patient reports pain is improved  Objective:   VITALS:   Filed Vitals:   05/02/16 0408 05/02/16 1435 05/02/16 2016 05/03/16 0519  BP: 147/51 116/28 127/43 127/40  Pulse: 62 64 69 70  Temp: 98 F (36.7 C) 98.6 F (37 C) 98.9 F (37.2 C) 98.8 F (37.1 C)  TempSrc: Oral Oral Oral Oral  Resp: 18 18 18 18   Height:      Weight:      SpO2: 98% 99% 99% 96%    Neurologically intact Neurovascular intact Sensation intact distally Intact pulses distally Dorsiflexion/Plantar flexion intact Incision: dressing C/D/I and no drainage No cellulitis present Compartment soft   Lab Results  Component Value Date   WBC 11.1* 05/03/2016   HGB 11.0* 05/03/2016   HCT 34.7* 05/03/2016   MCV 90.8 05/03/2016   PLT 249 05/03/2016     Assessment/Plan:  2 Days Post-Op   - cleared PT yesterday - dc home today - dressings changed  Marianna Payment 05/03/2016, 8:35 AM [phone]

## 2016-05-03 NOTE — Progress Notes (Signed)
Physical Therapy Treatment Patient Details Name: ALFIE SVETLIK MRN: [id number] DOB: Aug 30, 1956 Today's Date: 05/03/2016    History of Present Illness Pt is a 60 y/o F s/p Rt TKA.  Pt's PMH includes chronic back pain, obesity, cancer.    PT Comments    Pt is for d/c home today. Pt to continue with HHPT.  Follow Up Recommendations  Home health PT;Supervision for mobility/OOB     Equipment Recommendations  Rolling walker with 5" wheels;3in1 (PT)    Recommendations for Other Services       Precautions / Restrictions Precautions Precautions: Knee;Fall Precaution Comments: Reviewed no pillow under knees Required Braces or Orthoses: Knee Immobilizer - Right Knee Immobilizer - Right: On except when in CPM Restrictions Weight Bearing Restrictions: Yes RLE Weight Bearing: Weight bearing as tolerated    Mobility  Bed Mobility               General bed mobility comments: OOB in chair  Transfers   Equipment used: Rolling walker (2 wheeled)   Sit to Stand: Supervision         General transfer comment: demo'd safe technique  Ambulation/Gait Ambulation/Gait assistance: Supervision Ambulation Distance (Feet): 250 Feet Assistive device: Rolling walker (2 wheeled) Gait Pattern/deviations: Step-through pattern;Decreased stride length;Antalgic Gait velocity: decreased   General Gait Details: Pt demo safe technique and good RW management   Stairs            Wheelchair Mobility    Modified Rankin (Stroke Patients Only)       Balance                                    Cognition Arousal/Alertness: Awake/alert Behavior During Therapy: WFL for tasks assessed/performed Overall Cognitive Status: Within Functional Limits for tasks assessed                      Exercises      General Comments        Pertinent Vitals/Pain Pain Assessment: 0-10 Pain Score: 5  Pain Location: R knee Pain Descriptors / Indicators: Sore Pain  Intervention(s): Limited activity within patient's tolerance    Home Living                      Prior Function            PT Goals (current goals can now be found in the care plan section) Acute Rehab PT Goals Patient Stated Goal: to get stronger and regain independence PT Goal Formulation: With patient Time For Goal Achievement: 05/08/16 Potential to Achieve Goals: Good Progress towards PT goals: Progressing toward goals    Frequency  7X/week    PT Plan Current plan remains appropriate    Co-evaluation             End of Session Equipment Utilized During Treatment: Gait belt;Right knee immobilizer Activity Tolerance: Patient tolerated treatment well Patient left: in chair;with call bell/phone within reach;with family/visitor present     Time: [id number] PT Time Calculation (min) (ACUTE ONLY): 10 min  Charges:  $Gait Training: 8-22 mins                    G Codes:      Lorriane Shire 05/03/2016, 11:12 AM

## 2016-05-03 NOTE — Progress Notes (Signed)
CM called [facility] DME rep, Germaine to please deliver the rolling walker and 3n1 to room so pt can discharge home.  Pt is set up with [facility] for HHPT by previous CM.  No other CM needs were communicated.

## 2016-05-04 ENCOUNTER — Encounter (HOSPITAL_COMMUNITY): Payer: Self-pay | Admitting: Orthopaedic Surgery

## 2016-05-05 DIAGNOSIS — E669 Obesity, unspecified: Secondary | ICD-10-CM | POA: Diagnosis not present

## 2016-05-05 DIAGNOSIS — Z7982 Long term (current) use of aspirin: Secondary | ICD-10-CM | POA: Diagnosis not present

## 2016-05-05 DIAGNOSIS — I2510 Atherosclerotic heart disease of native coronary artery without angina pectoris: Secondary | ICD-10-CM | POA: Diagnosis not present

## 2016-05-05 DIAGNOSIS — Z96651 Presence of right artificial knee joint: Secondary | ICD-10-CM | POA: Diagnosis not present

## 2016-05-05 DIAGNOSIS — Z471 Aftercare following joint replacement surgery: Secondary | ICD-10-CM | POA: Diagnosis not present

## 2016-05-05 DIAGNOSIS — G4730 Sleep apnea, unspecified: Secondary | ICD-10-CM | POA: Diagnosis not present

## 2016-05-05 DIAGNOSIS — Z87891 Personal history of nicotine dependence: Secondary | ICD-10-CM | POA: Diagnosis not present

## 2016-05-05 DIAGNOSIS — E785 Hyperlipidemia, unspecified: Secondary | ICD-10-CM | POA: Diagnosis not present

## 2016-05-05 DIAGNOSIS — I10 Essential (primary) hypertension: Secondary | ICD-10-CM | POA: Diagnosis not present

## 2016-05-06 DIAGNOSIS — G4730 Sleep apnea, unspecified: Secondary | ICD-10-CM | POA: Diagnosis not present

## 2016-05-06 DIAGNOSIS — E785 Hyperlipidemia, unspecified: Secondary | ICD-10-CM | POA: Diagnosis not present

## 2016-05-06 DIAGNOSIS — Z7982 Long term (current) use of aspirin: Secondary | ICD-10-CM | POA: Diagnosis not present

## 2016-05-06 DIAGNOSIS — Z96651 Presence of right artificial knee joint: Secondary | ICD-10-CM | POA: Diagnosis not present

## 2016-05-06 DIAGNOSIS — I10 Essential (primary) hypertension: Secondary | ICD-10-CM | POA: Diagnosis not present

## 2016-05-06 DIAGNOSIS — Z87891 Personal history of nicotine dependence: Secondary | ICD-10-CM | POA: Diagnosis not present

## 2016-05-06 DIAGNOSIS — E669 Obesity, unspecified: Secondary | ICD-10-CM | POA: Diagnosis not present

## 2016-05-06 DIAGNOSIS — Z471 Aftercare following joint replacement surgery: Secondary | ICD-10-CM | POA: Diagnosis not present

## 2016-05-06 DIAGNOSIS — I2510 Atherosclerotic heart disease of native coronary artery without angina pectoris: Secondary | ICD-10-CM | POA: Diagnosis not present

## 2016-05-08 DIAGNOSIS — Z7982 Long term (current) use of aspirin: Secondary | ICD-10-CM | POA: Diagnosis not present

## 2016-05-08 DIAGNOSIS — Z87891 Personal history of nicotine dependence: Secondary | ICD-10-CM | POA: Diagnosis not present

## 2016-05-08 DIAGNOSIS — E785 Hyperlipidemia, unspecified: Secondary | ICD-10-CM | POA: Diagnosis not present

## 2016-05-08 DIAGNOSIS — E669 Obesity, unspecified: Secondary | ICD-10-CM | POA: Diagnosis not present

## 2016-05-08 DIAGNOSIS — Z471 Aftercare following joint replacement surgery: Secondary | ICD-10-CM | POA: Diagnosis not present

## 2016-05-08 DIAGNOSIS — I2510 Atherosclerotic heart disease of native coronary artery without angina pectoris: Secondary | ICD-10-CM | POA: Diagnosis not present

## 2016-05-08 DIAGNOSIS — G4730 Sleep apnea, unspecified: Secondary | ICD-10-CM | POA: Diagnosis not present

## 2016-05-08 DIAGNOSIS — I10 Essential (primary) hypertension: Secondary | ICD-10-CM | POA: Diagnosis not present

## 2016-05-08 DIAGNOSIS — Z96651 Presence of right artificial knee joint: Secondary | ICD-10-CM | POA: Diagnosis not present

## 2016-05-11 DIAGNOSIS — Z96651 Presence of right artificial knee joint: Secondary | ICD-10-CM | POA: Diagnosis not present

## 2016-05-11 DIAGNOSIS — E669 Obesity, unspecified: Secondary | ICD-10-CM | POA: Diagnosis not present

## 2016-05-11 DIAGNOSIS — Z471 Aftercare following joint replacement surgery: Secondary | ICD-10-CM | POA: Diagnosis not present

## 2016-05-11 DIAGNOSIS — G4730 Sleep apnea, unspecified: Secondary | ICD-10-CM | POA: Diagnosis not present

## 2016-05-11 DIAGNOSIS — I10 Essential (primary) hypertension: Secondary | ICD-10-CM | POA: Diagnosis not present

## 2016-05-11 DIAGNOSIS — E785 Hyperlipidemia, unspecified: Secondary | ICD-10-CM | POA: Diagnosis not present

## 2016-05-11 DIAGNOSIS — I2510 Atherosclerotic heart disease of native coronary artery without angina pectoris: Secondary | ICD-10-CM | POA: Diagnosis not present

## 2016-05-11 DIAGNOSIS — Z7982 Long term (current) use of aspirin: Secondary | ICD-10-CM | POA: Diagnosis not present

## 2016-05-11 DIAGNOSIS — Z87891 Personal history of nicotine dependence: Secondary | ICD-10-CM | POA: Diagnosis not present

## 2016-05-13 DIAGNOSIS — E785 Hyperlipidemia, unspecified: Secondary | ICD-10-CM | POA: Diagnosis not present

## 2016-05-13 DIAGNOSIS — Z7982 Long term (current) use of aspirin: Secondary | ICD-10-CM | POA: Diagnosis not present

## 2016-05-13 DIAGNOSIS — I2510 Atherosclerotic heart disease of native coronary artery without angina pectoris: Secondary | ICD-10-CM | POA: Diagnosis not present

## 2016-05-13 DIAGNOSIS — Z96651 Presence of right artificial knee joint: Secondary | ICD-10-CM | POA: Diagnosis not present

## 2016-05-13 DIAGNOSIS — G4730 Sleep apnea, unspecified: Secondary | ICD-10-CM | POA: Diagnosis not present

## 2016-05-13 DIAGNOSIS — I10 Essential (primary) hypertension: Secondary | ICD-10-CM | POA: Diagnosis not present

## 2016-05-13 DIAGNOSIS — Z471 Aftercare following joint replacement surgery: Secondary | ICD-10-CM | POA: Diagnosis not present

## 2016-05-13 DIAGNOSIS — Z87891 Personal history of nicotine dependence: Secondary | ICD-10-CM | POA: Diagnosis not present

## 2016-05-13 DIAGNOSIS — E669 Obesity, unspecified: Secondary | ICD-10-CM | POA: Diagnosis not present

## 2016-05-14 ENCOUNTER — Ambulatory Visit: Payer: BLUE CROSS/BLUE SHIELD | Admitting: Cardiovascular Disease

## 2016-05-14 ENCOUNTER — Encounter: Payer: Self-pay | Admitting: *Deleted

## 2016-05-14 DIAGNOSIS — Z87891 Personal history of nicotine dependence: Secondary | ICD-10-CM | POA: Diagnosis not present

## 2016-05-14 DIAGNOSIS — E669 Obesity, unspecified: Secondary | ICD-10-CM | POA: Diagnosis not present

## 2016-05-14 DIAGNOSIS — Z471 Aftercare following joint replacement surgery: Secondary | ICD-10-CM | POA: Diagnosis not present

## 2016-05-14 DIAGNOSIS — E785 Hyperlipidemia, unspecified: Secondary | ICD-10-CM | POA: Diagnosis not present

## 2016-05-14 DIAGNOSIS — G4730 Sleep apnea, unspecified: Secondary | ICD-10-CM | POA: Diagnosis not present

## 2016-05-14 DIAGNOSIS — I2510 Atherosclerotic heart disease of native coronary artery without angina pectoris: Secondary | ICD-10-CM | POA: Diagnosis not present

## 2016-05-14 DIAGNOSIS — I10 Essential (primary) hypertension: Secondary | ICD-10-CM | POA: Diagnosis not present

## 2016-05-14 DIAGNOSIS — Z7982 Long term (current) use of aspirin: Secondary | ICD-10-CM | POA: Diagnosis not present

## 2016-05-14 DIAGNOSIS — R0989 Other specified symptoms and signs involving the circulatory and respiratory systems: Secondary | ICD-10-CM

## 2016-05-14 DIAGNOSIS — Z96651 Presence of right artificial knee joint: Secondary | ICD-10-CM | POA: Diagnosis not present

## 2016-05-15 DIAGNOSIS — M1711 Unilateral primary osteoarthritis, right knee: Secondary | ICD-10-CM | POA: Diagnosis not present

## 2016-05-19 DIAGNOSIS — R269 Unspecified abnormalities of gait and mobility: Secondary | ICD-10-CM | POA: Diagnosis not present

## 2016-05-19 DIAGNOSIS — M25561 Pain in right knee: Secondary | ICD-10-CM | POA: Diagnosis not present

## 2016-05-19 DIAGNOSIS — M25461 Effusion, right knee: Secondary | ICD-10-CM | POA: Diagnosis not present

## 2016-05-19 DIAGNOSIS — Z96651 Presence of right artificial knee joint: Secondary | ICD-10-CM | POA: Diagnosis not present

## 2016-05-21 DIAGNOSIS — R269 Unspecified abnormalities of gait and mobility: Secondary | ICD-10-CM | POA: Diagnosis not present

## 2016-05-21 DIAGNOSIS — M25561 Pain in right knee: Secondary | ICD-10-CM | POA: Diagnosis not present

## 2016-05-21 DIAGNOSIS — Z96651 Presence of right artificial knee joint: Secondary | ICD-10-CM | POA: Diagnosis not present

## 2016-05-21 DIAGNOSIS — M25461 Effusion, right knee: Secondary | ICD-10-CM | POA: Diagnosis not present

## 2016-05-22 DIAGNOSIS — M25461 Effusion, right knee: Secondary | ICD-10-CM | POA: Diagnosis not present

## 2016-05-22 DIAGNOSIS — Z96651 Presence of right artificial knee joint: Secondary | ICD-10-CM | POA: Diagnosis not present

## 2016-05-22 DIAGNOSIS — R269 Unspecified abnormalities of gait and mobility: Secondary | ICD-10-CM | POA: Diagnosis not present

## 2016-05-22 DIAGNOSIS — M25561 Pain in right knee: Secondary | ICD-10-CM | POA: Diagnosis not present

## 2016-05-25 DIAGNOSIS — M25461 Effusion, right knee: Secondary | ICD-10-CM | POA: Diagnosis not present

## 2016-05-25 DIAGNOSIS — M25561 Pain in right knee: Secondary | ICD-10-CM | POA: Diagnosis not present

## 2016-05-25 DIAGNOSIS — Z96651 Presence of right artificial knee joint: Secondary | ICD-10-CM | POA: Diagnosis not present

## 2016-05-25 DIAGNOSIS — R269 Unspecified abnormalities of gait and mobility: Secondary | ICD-10-CM | POA: Diagnosis not present

## 2016-05-27 DIAGNOSIS — R269 Unspecified abnormalities of gait and mobility: Secondary | ICD-10-CM | POA: Diagnosis not present

## 2016-05-27 DIAGNOSIS — M25561 Pain in right knee: Secondary | ICD-10-CM | POA: Diagnosis not present

## 2016-05-27 DIAGNOSIS — M25461 Effusion, right knee: Secondary | ICD-10-CM | POA: Diagnosis not present

## 2016-05-27 DIAGNOSIS — Z96651 Presence of right artificial knee joint: Secondary | ICD-10-CM | POA: Diagnosis not present

## 2016-06-01 DIAGNOSIS — Z96651 Presence of right artificial knee joint: Secondary | ICD-10-CM | POA: Diagnosis not present

## 2016-06-01 DIAGNOSIS — M25561 Pain in right knee: Secondary | ICD-10-CM | POA: Diagnosis not present

## 2016-06-01 DIAGNOSIS — M25461 Effusion, right knee: Secondary | ICD-10-CM | POA: Diagnosis not present

## 2016-06-01 DIAGNOSIS — R269 Unspecified abnormalities of gait and mobility: Secondary | ICD-10-CM | POA: Diagnosis not present

## 2016-06-03 DIAGNOSIS — R269 Unspecified abnormalities of gait and mobility: Secondary | ICD-10-CM | POA: Diagnosis not present

## 2016-06-03 DIAGNOSIS — M25561 Pain in right knee: Secondary | ICD-10-CM | POA: Diagnosis not present

## 2016-06-03 DIAGNOSIS — M25461 Effusion, right knee: Secondary | ICD-10-CM | POA: Diagnosis not present

## 2016-06-03 DIAGNOSIS — Z96651 Presence of right artificial knee joint: Secondary | ICD-10-CM | POA: Diagnosis not present

## 2016-06-05 DIAGNOSIS — Z96651 Presence of right artificial knee joint: Secondary | ICD-10-CM | POA: Diagnosis not present

## 2016-06-05 DIAGNOSIS — M25561 Pain in right knee: Secondary | ICD-10-CM | POA: Diagnosis not present

## 2016-06-05 DIAGNOSIS — M25461 Effusion, right knee: Secondary | ICD-10-CM | POA: Diagnosis not present

## 2016-06-05 DIAGNOSIS — R269 Unspecified abnormalities of gait and mobility: Secondary | ICD-10-CM | POA: Diagnosis not present

## 2016-06-08 DIAGNOSIS — R269 Unspecified abnormalities of gait and mobility: Secondary | ICD-10-CM | POA: Diagnosis not present

## 2016-06-08 DIAGNOSIS — Z96651 Presence of right artificial knee joint: Secondary | ICD-10-CM | POA: Diagnosis not present

## 2016-06-08 DIAGNOSIS — M25461 Effusion, right knee: Secondary | ICD-10-CM | POA: Diagnosis not present

## 2016-06-08 DIAGNOSIS — M25561 Pain in right knee: Secondary | ICD-10-CM | POA: Diagnosis not present

## 2016-06-10 DIAGNOSIS — M25461 Effusion, right knee: Secondary | ICD-10-CM | POA: Diagnosis not present

## 2016-06-10 DIAGNOSIS — M25561 Pain in right knee: Secondary | ICD-10-CM | POA: Diagnosis not present

## 2016-06-10 DIAGNOSIS — R269 Unspecified abnormalities of gait and mobility: Secondary | ICD-10-CM | POA: Diagnosis not present

## 2016-06-10 DIAGNOSIS — Z96651 Presence of right artificial knee joint: Secondary | ICD-10-CM | POA: Diagnosis not present

## 2016-06-12 DIAGNOSIS — Z96651 Presence of right artificial knee joint: Secondary | ICD-10-CM | POA: Diagnosis not present

## 2016-06-12 DIAGNOSIS — R269 Unspecified abnormalities of gait and mobility: Secondary | ICD-10-CM | POA: Diagnosis not present

## 2016-06-12 DIAGNOSIS — M25461 Effusion, right knee: Secondary | ICD-10-CM | POA: Diagnosis not present

## 2016-06-12 DIAGNOSIS — M25561 Pain in right knee: Secondary | ICD-10-CM | POA: Diagnosis not present

## 2016-06-17 DIAGNOSIS — M25461 Effusion, right knee: Secondary | ICD-10-CM | POA: Diagnosis not present

## 2016-06-17 DIAGNOSIS — Z96651 Presence of right artificial knee joint: Secondary | ICD-10-CM | POA: Diagnosis not present

## 2016-06-17 DIAGNOSIS — R269 Unspecified abnormalities of gait and mobility: Secondary | ICD-10-CM | POA: Diagnosis not present

## 2016-06-17 DIAGNOSIS — M25561 Pain in right knee: Secondary | ICD-10-CM | POA: Diagnosis not present

## 2016-06-19 DIAGNOSIS — M25561 Pain in right knee: Secondary | ICD-10-CM | POA: Diagnosis not present

## 2016-06-19 DIAGNOSIS — Z96651 Presence of right artificial knee joint: Secondary | ICD-10-CM | POA: Diagnosis not present

## 2016-06-19 DIAGNOSIS — R269 Unspecified abnormalities of gait and mobility: Secondary | ICD-10-CM | POA: Diagnosis not present

## 2016-06-19 DIAGNOSIS — M25461 Effusion, right knee: Secondary | ICD-10-CM | POA: Diagnosis not present

## 2016-06-22 DIAGNOSIS — M25561 Pain in right knee: Secondary | ICD-10-CM | POA: Diagnosis not present

## 2016-06-22 DIAGNOSIS — M25461 Effusion, right knee: Secondary | ICD-10-CM | POA: Diagnosis not present

## 2016-06-22 DIAGNOSIS — Z96651 Presence of right artificial knee joint: Secondary | ICD-10-CM | POA: Diagnosis not present

## 2016-06-22 DIAGNOSIS — R269 Unspecified abnormalities of gait and mobility: Secondary | ICD-10-CM | POA: Diagnosis not present

## 2016-06-24 DIAGNOSIS — M25561 Pain in right knee: Secondary | ICD-10-CM | POA: Diagnosis not present

## 2016-06-24 DIAGNOSIS — Z96651 Presence of right artificial knee joint: Secondary | ICD-10-CM | POA: Diagnosis not present

## 2016-06-24 DIAGNOSIS — R269 Unspecified abnormalities of gait and mobility: Secondary | ICD-10-CM | POA: Diagnosis not present

## 2016-06-24 DIAGNOSIS — M25461 Effusion, right knee: Secondary | ICD-10-CM | POA: Diagnosis not present

## 2016-06-25 DIAGNOSIS — Z96651 Presence of right artificial knee joint: Secondary | ICD-10-CM | POA: Diagnosis not present

## 2016-06-25 DIAGNOSIS — M25561 Pain in right knee: Secondary | ICD-10-CM | POA: Diagnosis not present

## 2016-06-25 DIAGNOSIS — M25461 Effusion, right knee: Secondary | ICD-10-CM | POA: Diagnosis not present

## 2016-06-25 DIAGNOSIS — R269 Unspecified abnormalities of gait and mobility: Secondary | ICD-10-CM | POA: Diagnosis not present

## 2016-06-29 DIAGNOSIS — Z96651 Presence of right artificial knee joint: Secondary | ICD-10-CM | POA: Diagnosis not present

## 2016-06-29 DIAGNOSIS — M25561 Pain in right knee: Secondary | ICD-10-CM | POA: Diagnosis not present

## 2016-06-29 DIAGNOSIS — R269 Unspecified abnormalities of gait and mobility: Secondary | ICD-10-CM | POA: Diagnosis not present

## 2016-06-29 DIAGNOSIS — M25461 Effusion, right knee: Secondary | ICD-10-CM | POA: Diagnosis not present

## 2016-07-02 DIAGNOSIS — R269 Unspecified abnormalities of gait and mobility: Secondary | ICD-10-CM | POA: Diagnosis not present

## 2016-07-02 DIAGNOSIS — M25461 Effusion, right knee: Secondary | ICD-10-CM | POA: Diagnosis not present

## 2016-07-02 DIAGNOSIS — M25561 Pain in right knee: Secondary | ICD-10-CM | POA: Diagnosis not present

## 2016-07-02 DIAGNOSIS — Z96651 Presence of right artificial knee joint: Secondary | ICD-10-CM | POA: Diagnosis not present

## 2016-07-07 DIAGNOSIS — M25561 Pain in right knee: Secondary | ICD-10-CM | POA: Diagnosis not present

## 2016-07-07 DIAGNOSIS — R269 Unspecified abnormalities of gait and mobility: Secondary | ICD-10-CM | POA: Diagnosis not present

## 2016-07-07 DIAGNOSIS — Z96651 Presence of right artificial knee joint: Secondary | ICD-10-CM | POA: Diagnosis not present

## 2016-07-07 DIAGNOSIS — M25461 Effusion, right knee: Secondary | ICD-10-CM | POA: Diagnosis not present

## 2016-07-08 DIAGNOSIS — R2241 Localized swelling, mass and lump, right lower limb: Secondary | ICD-10-CM | POA: Diagnosis not present

## 2016-07-08 DIAGNOSIS — M79604 Pain in right leg: Secondary | ICD-10-CM | POA: Diagnosis not present

## 2016-07-08 DIAGNOSIS — M79661 Pain in right lower leg: Secondary | ICD-10-CM | POA: Diagnosis not present

## 2016-07-09 DIAGNOSIS — Z96651 Presence of right artificial knee joint: Secondary | ICD-10-CM | POA: Diagnosis not present

## 2016-07-09 DIAGNOSIS — R269 Unspecified abnormalities of gait and mobility: Secondary | ICD-10-CM | POA: Diagnosis not present

## 2016-07-09 DIAGNOSIS — M25561 Pain in right knee: Secondary | ICD-10-CM | POA: Diagnosis not present

## 2016-07-09 DIAGNOSIS — M25461 Effusion, right knee: Secondary | ICD-10-CM | POA: Diagnosis not present

## 2016-07-13 DIAGNOSIS — M1711 Unilateral primary osteoarthritis, right knee: Secondary | ICD-10-CM | POA: Diagnosis not present

## 2016-07-15 DIAGNOSIS — R269 Unspecified abnormalities of gait and mobility: Secondary | ICD-10-CM | POA: Diagnosis not present

## 2016-07-15 DIAGNOSIS — Z96651 Presence of right artificial knee joint: Secondary | ICD-10-CM | POA: Diagnosis not present

## 2016-07-15 DIAGNOSIS — M25461 Effusion, right knee: Secondary | ICD-10-CM | POA: Diagnosis not present

## 2016-07-15 DIAGNOSIS — M25561 Pain in right knee: Secondary | ICD-10-CM | POA: Diagnosis not present

## 2016-07-17 DIAGNOSIS — Z96651 Presence of right artificial knee joint: Secondary | ICD-10-CM | POA: Diagnosis not present

## 2016-07-17 DIAGNOSIS — R269 Unspecified abnormalities of gait and mobility: Secondary | ICD-10-CM | POA: Diagnosis not present

## 2016-07-17 DIAGNOSIS — M25461 Effusion, right knee: Secondary | ICD-10-CM | POA: Diagnosis not present

## 2016-07-17 DIAGNOSIS — M25561 Pain in right knee: Secondary | ICD-10-CM | POA: Diagnosis not present

## 2016-07-30 DIAGNOSIS — M25561 Pain in right knee: Secondary | ICD-10-CM | POA: Diagnosis not present

## 2016-07-30 DIAGNOSIS — R269 Unspecified abnormalities of gait and mobility: Secondary | ICD-10-CM | POA: Diagnosis not present

## 2016-07-30 DIAGNOSIS — Z96651 Presence of right artificial knee joint: Secondary | ICD-10-CM | POA: Diagnosis not present

## 2016-07-30 DIAGNOSIS — M25461 Effusion, right knee: Secondary | ICD-10-CM | POA: Diagnosis not present

## 2016-08-03 DIAGNOSIS — Z96651 Presence of right artificial knee joint: Secondary | ICD-10-CM | POA: Diagnosis not present

## 2016-08-03 DIAGNOSIS — M25461 Effusion, right knee: Secondary | ICD-10-CM | POA: Diagnosis not present

## 2016-08-03 DIAGNOSIS — R269 Unspecified abnormalities of gait and mobility: Secondary | ICD-10-CM | POA: Diagnosis not present

## 2016-08-03 DIAGNOSIS — M25561 Pain in right knee: Secondary | ICD-10-CM | POA: Diagnosis not present

## 2016-08-04 DIAGNOSIS — M25461 Effusion, right knee: Secondary | ICD-10-CM | POA: Diagnosis not present

## 2016-08-04 DIAGNOSIS — R269 Unspecified abnormalities of gait and mobility: Secondary | ICD-10-CM | POA: Diagnosis not present

## 2016-08-04 DIAGNOSIS — M25561 Pain in right knee: Secondary | ICD-10-CM | POA: Diagnosis not present

## 2016-08-04 DIAGNOSIS — Z96651 Presence of right artificial knee joint: Secondary | ICD-10-CM | POA: Diagnosis not present

## 2016-08-05 DIAGNOSIS — M25461 Effusion, right knee: Secondary | ICD-10-CM | POA: Diagnosis not present

## 2016-08-05 DIAGNOSIS — Z96651 Presence of right artificial knee joint: Secondary | ICD-10-CM | POA: Diagnosis not present

## 2016-08-05 DIAGNOSIS — R269 Unspecified abnormalities of gait and mobility: Secondary | ICD-10-CM | POA: Diagnosis not present

## 2016-08-05 DIAGNOSIS — M25561 Pain in right knee: Secondary | ICD-10-CM | POA: Diagnosis not present

## 2016-08-06 DIAGNOSIS — Z96651 Presence of right artificial knee joint: Secondary | ICD-10-CM | POA: Diagnosis not present

## 2016-08-06 DIAGNOSIS — M25461 Effusion, right knee: Secondary | ICD-10-CM | POA: Diagnosis not present

## 2016-08-06 DIAGNOSIS — R269 Unspecified abnormalities of gait and mobility: Secondary | ICD-10-CM | POA: Diagnosis not present

## 2016-08-06 DIAGNOSIS — M25561 Pain in right knee: Secondary | ICD-10-CM | POA: Diagnosis not present

## 2016-08-11 DIAGNOSIS — M25461 Effusion, right knee: Secondary | ICD-10-CM | POA: Diagnosis not present

## 2016-08-11 DIAGNOSIS — M25561 Pain in right knee: Secondary | ICD-10-CM | POA: Diagnosis not present

## 2016-08-11 DIAGNOSIS — R269 Unspecified abnormalities of gait and mobility: Secondary | ICD-10-CM | POA: Diagnosis not present

## 2016-08-11 DIAGNOSIS — Z96651 Presence of right artificial knee joint: Secondary | ICD-10-CM | POA: Diagnosis not present

## 2016-08-12 DIAGNOSIS — Z96651 Presence of right artificial knee joint: Secondary | ICD-10-CM | POA: Diagnosis not present

## 2016-08-12 DIAGNOSIS — R269 Unspecified abnormalities of gait and mobility: Secondary | ICD-10-CM | POA: Diagnosis not present

## 2016-08-12 DIAGNOSIS — M25561 Pain in right knee: Secondary | ICD-10-CM | POA: Diagnosis not present

## 2016-08-12 DIAGNOSIS — M25461 Effusion, right knee: Secondary | ICD-10-CM | POA: Diagnosis not present

## 2016-08-13 DIAGNOSIS — Z96651 Presence of right artificial knee joint: Secondary | ICD-10-CM | POA: Diagnosis not present

## 2016-08-13 DIAGNOSIS — M25461 Effusion, right knee: Secondary | ICD-10-CM | POA: Diagnosis not present

## 2016-08-13 DIAGNOSIS — M25561 Pain in right knee: Secondary | ICD-10-CM | POA: Diagnosis not present

## 2016-08-13 DIAGNOSIS — R269 Unspecified abnormalities of gait and mobility: Secondary | ICD-10-CM | POA: Diagnosis not present

## 2016-08-18 DIAGNOSIS — M25561 Pain in right knee: Secondary | ICD-10-CM | POA: Diagnosis not present

## 2016-08-18 DIAGNOSIS — R269 Unspecified abnormalities of gait and mobility: Secondary | ICD-10-CM | POA: Diagnosis not present

## 2016-08-18 DIAGNOSIS — Z96651 Presence of right artificial knee joint: Secondary | ICD-10-CM | POA: Diagnosis not present

## 2016-08-18 DIAGNOSIS — M25461 Effusion, right knee: Secondary | ICD-10-CM | POA: Diagnosis not present

## 2016-08-19 DIAGNOSIS — M25561 Pain in right knee: Secondary | ICD-10-CM | POA: Diagnosis not present

## 2016-08-19 DIAGNOSIS — M25461 Effusion, right knee: Secondary | ICD-10-CM | POA: Diagnosis not present

## 2016-08-19 DIAGNOSIS — Z96651 Presence of right artificial knee joint: Secondary | ICD-10-CM | POA: Diagnosis not present

## 2016-08-19 DIAGNOSIS — R269 Unspecified abnormalities of gait and mobility: Secondary | ICD-10-CM | POA: Diagnosis not present

## 2016-08-25 DIAGNOSIS — R269 Unspecified abnormalities of gait and mobility: Secondary | ICD-10-CM | POA: Diagnosis not present

## 2016-08-25 DIAGNOSIS — M25461 Effusion, right knee: Secondary | ICD-10-CM | POA: Diagnosis not present

## 2016-08-25 DIAGNOSIS — M25561 Pain in right knee: Secondary | ICD-10-CM | POA: Diagnosis not present

## 2016-08-25 DIAGNOSIS — Z96651 Presence of right artificial knee joint: Secondary | ICD-10-CM | POA: Diagnosis not present

## 2016-08-26 DIAGNOSIS — R269 Unspecified abnormalities of gait and mobility: Secondary | ICD-10-CM | POA: Diagnosis not present

## 2016-08-26 DIAGNOSIS — M25461 Effusion, right knee: Secondary | ICD-10-CM | POA: Diagnosis not present

## 2016-08-26 DIAGNOSIS — M25561 Pain in right knee: Secondary | ICD-10-CM | POA: Diagnosis not present

## 2016-08-26 DIAGNOSIS — Z96651 Presence of right artificial knee joint: Secondary | ICD-10-CM | POA: Diagnosis not present

## 2016-09-10 ENCOUNTER — Encounter (INDEPENDENT_AMBULATORY_CARE_PROVIDER_SITE_OTHER): Payer: Self-pay | Admitting: Sports Medicine

## 2016-09-10 ENCOUNTER — Ambulatory Visit (INDEPENDENT_AMBULATORY_CARE_PROVIDER_SITE_OTHER): Payer: BLUE CROSS/BLUE SHIELD | Admitting: Sports Medicine

## 2016-09-10 ENCOUNTER — Ambulatory Visit (INDEPENDENT_AMBULATORY_CARE_PROVIDER_SITE_OTHER): Payer: Self-pay

## 2016-09-10 ENCOUNTER — Ambulatory Visit (INDEPENDENT_AMBULATORY_CARE_PROVIDER_SITE_OTHER): Payer: Self-pay | Admitting: Orthopaedic Surgery

## 2016-09-10 VITALS — BP 168/86 | HR 66 | Ht 68.0 in | Wt 264.0 lb

## 2016-09-10 DIAGNOSIS — M25561 Pain in right knee: Secondary | ICD-10-CM | POA: Diagnosis not present

## 2016-09-10 DIAGNOSIS — Z96651 Presence of right artificial knee joint: Secondary | ICD-10-CM

## 2016-09-10 DIAGNOSIS — G8929 Other chronic pain: Secondary | ICD-10-CM | POA: Diagnosis not present

## 2016-09-10 NOTE — Progress Notes (Signed)
Kathleen [name] - 60 y.o. female MRN [id number]  Date of birth: July 25, 1956  Office Visit Note: Visit Date: 09/10/2016 PCP: Leonard Downing, MD Referred by: Leonard Downing, *  Subjective: Chief Complaint  Patient presents with  . Right Knee - Pain   HPI: Patient states right knee pain. States it just hurts, pressure on the right side of right knee. Could not go back to therapy until she saw [place] again. Thinks it may be the baker's cyst.  Using ice, wearing compression hose.    ROS Otherwise per HPI.  Assessment & Plan: Visit Diagnoses:  1. Chronic pain of right knee   2. Status post total right knee replacement     Plan: Findings:  Patient is had persistent pain since that time of her surgery. She still having some daily pain as well as issues with ambulation. Briefly discussed the case with Dr. Marlou Sa today in Dr. Narda Amber absence & overall this does seem to be consistent with normal although delayed postoperative course. This is something we need to continue to reevaluate as she regains her strength is there is a question of possible flexion instability appreciated at 35-45. Would like for her to begin working on a fairly extensive leg program including exercise bike & leg press with physical therapy & will have her follow-up with Dr. Lorin Mercy in 6 weeks for clinical reevaluation. Range of motion is excellent & once again she seems to be slightly delayed in her postoperative recovery but still at an appropriate level at this time.    Meds & Orders: No orders of the defined types were placed in this encounter.   Orders Placed This Encounter  Procedures  . XR Knee 1-2 Views Right  . Ambulatory referral to Physical Therapy    Follow-up: Return in about 6 weeks (around 10/22/2016) for with Dr. Lorin Mercy.   Procedures: No procedures performed  No notes on file   Clinical History: No specialty comments available.  She reports that she has quit smoking. Her smoking use included  Cigarettes. She has a 5.00 pack-year smoking history. She does not have any smokeless tobacco history on file. No results for input(s): HGBA1C, LABURIC in the last 8760 hours.  Objective:  VS:  HT:5\' 8"  (172.7 cm)   WT:264 lb (119.7 kg)  BMI:40.2    BP:(!) 168/86  HR:66bpm  TEMP: ( )  RESP:  Physical Exam  Constitutional: She appears well-developed and well-nourished. No distress.  Alert and appropriately interactive.  HENT:  Head: Normocephalic and atraumatic.  Pulmonary/Chest: Effort normal. No respiratory distress.  Skin: Skin is warm and dry. No rash noted. She is not diaphoretic. No erythema. No pallor.  Psychiatric: She has a normal mood and affect. Her behavior is normal. Judgment and thought content normal.    Right Knee Exam   Comments:  Postoperative incision is well-healed. She has no significant erythema or effusion. She is stable to varus & valgus strain at 0 & had 30-40 of knee flexion she does have a slight amount of opening with a mechanical click appreciated. Pendulum testing is stable. Lower extremity edema symmetric bilaterally. Range of motion is from 0 to 125.     Imaging: Xr Knee 1-2 Views Right  Result Date: 09/13/2016 Findings: 2V x-ray right knee obtained that shows no evidence of acute hardware failure. Normal postoperative changes from a total knee arthroplasty. She has a moderate amount of degenerative change within the medial compartment of the left knee. Bone cement interface is normal  appearing without erosions. No acute fracture or dislocation appreciated. Impression: Normal postoperative x-ray.    Past Medical/Family/Surgical/Social History: Medications & Allergies reviewed per EMR Patient Active Problem List   Diagnosis Date Noted  . 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 Pre-operative cardiovascular examination 04/15/2015  . Murmur 08/03/2012  . Palpitations 08/03/2012  . Coronary artery disease   . Hyperlipidemia   .  Hypertension    Past Medical History:  Diagnosis Date  . Arthritis   . Cancer ([place])   . Chronic back pain   . Complication of anesthesia    Woke up during  Hysterectomy  . Coronary artery disease 2012   Mild to Moderate  . Family history of adverse reaction to anesthesia   . GERD (gastroesophageal reflux disease)    omeprazole prn  . Heart murmur    "nothing to be concerned about  . History of blood transfusion   . Hx of thyroid cancer   . Hyperlipidemia   . Hypertension   . Hypothyroidism   . Obesity   . PONV (postoperative nausea and vomiting)    with thyroid surgery  . Shortness of breath dyspnea    with exertion  . Sleep apnea, obstructive    possible  . Tobacco abuse    Family History  Problem Relation Age of Onset  . Diabetes Mother    Past Surgical History:  Procedure Laterality Date  . ABDOMINAL HYSTERECTOMY  1999  . CARDIAC CATHETERIZATION  Feb 2012   LAD: 60% mid (FFR ratio 0.83), LCX: 30-40%, RCA 20%.   Marland Kitchen KNEE ARTHROPLASTY Right 05/01/2016   Procedure:  TOTAL KNEE ARTHROPLASTY;  Surgeon: Marybelle Killings, MD;  Location: [place];  Service: Orthopedics;  Laterality: Right;  . THYROIDECTOMY  2000   Social History   Occupational History  . Not on file.   Social History Main Topics  . Smoking status: Former Smoker    Packs/day: 0.25    Years: 20.00    Types: Cigarettes  . Smokeless tobacco: Not on file     Comment: quit 2012  . Alcohol use No  . Drug use: No  . Sexual activity: Not on file

## 2016-09-11 DIAGNOSIS — M25561 Pain in right knee: Secondary | ICD-10-CM | POA: Diagnosis not present

## 2016-09-11 DIAGNOSIS — Z96651 Presence of right artificial knee joint: Secondary | ICD-10-CM | POA: Diagnosis not present

## 2016-09-11 DIAGNOSIS — R269 Unspecified abnormalities of gait and mobility: Secondary | ICD-10-CM | POA: Diagnosis not present

## 2016-09-11 DIAGNOSIS — M25461 Effusion, right knee: Secondary | ICD-10-CM | POA: Diagnosis not present

## 2016-09-14 DIAGNOSIS — Z96651 Presence of right artificial knee joint: Secondary | ICD-10-CM | POA: Diagnosis not present

## 2016-09-14 DIAGNOSIS — M25561 Pain in right knee: Secondary | ICD-10-CM | POA: Diagnosis not present

## 2016-09-14 DIAGNOSIS — M25461 Effusion, right knee: Secondary | ICD-10-CM | POA: Diagnosis not present

## 2016-09-14 DIAGNOSIS — R269 Unspecified abnormalities of gait and mobility: Secondary | ICD-10-CM | POA: Diagnosis not present

## 2016-09-15 DIAGNOSIS — Z96651 Presence of right artificial knee joint: Secondary | ICD-10-CM | POA: Diagnosis not present

## 2016-09-15 DIAGNOSIS — M25561 Pain in right knee: Secondary | ICD-10-CM | POA: Diagnosis not present

## 2016-09-15 DIAGNOSIS — M25461 Effusion, right knee: Secondary | ICD-10-CM | POA: Diagnosis not present

## 2016-09-15 DIAGNOSIS — R269 Unspecified abnormalities of gait and mobility: Secondary | ICD-10-CM | POA: Diagnosis not present

## 2016-09-17 ENCOUNTER — Telehealth (INDEPENDENT_AMBULATORY_CARE_PROVIDER_SITE_OTHER): Payer: Self-pay | Admitting: *Deleted

## 2016-09-17 DIAGNOSIS — Z96651 Presence of right artificial knee joint: Secondary | ICD-10-CM | POA: Diagnosis not present

## 2016-09-17 DIAGNOSIS — R269 Unspecified abnormalities of gait and mobility: Secondary | ICD-10-CM | POA: Diagnosis not present

## 2016-09-17 DIAGNOSIS — M25561 Pain in right knee: Secondary | ICD-10-CM | POA: Diagnosis not present

## 2016-09-17 DIAGNOSIS — M25461 Effusion, right knee: Secondary | ICD-10-CM | POA: Diagnosis not present

## 2016-09-17 NOTE — Telephone Encounter (Signed)
FYI See message below. Thank You.

## 2016-09-17 NOTE — Telephone Encounter (Signed)
[facility] called stating they do not service [place] and will not be seeing this pt

## 2016-09-18 DIAGNOSIS — M25461 Effusion, right knee: Secondary | ICD-10-CM | POA: Diagnosis not present

## 2016-09-18 DIAGNOSIS — M25561 Pain in right knee: Secondary | ICD-10-CM | POA: Diagnosis not present

## 2016-09-18 DIAGNOSIS — Z96651 Presence of right artificial knee joint: Secondary | ICD-10-CM | POA: Diagnosis not present

## 2016-09-18 DIAGNOSIS — R269 Unspecified abnormalities of gait and mobility: Secondary | ICD-10-CM | POA: Diagnosis not present

## 2016-09-18 NOTE — Telephone Encounter (Signed)
I believe the prescription that was provided was for outpatient physical therapy... Is this what they are referencing?

## 2016-09-18 NOTE — Telephone Encounter (Signed)
Yes, it is!

## 2016-09-21 DIAGNOSIS — Z96651 Presence of right artificial knee joint: Secondary | ICD-10-CM | POA: Diagnosis not present

## 2016-09-21 DIAGNOSIS — R269 Unspecified abnormalities of gait and mobility: Secondary | ICD-10-CM | POA: Diagnosis not present

## 2016-09-21 DIAGNOSIS — M25461 Effusion, right knee: Secondary | ICD-10-CM | POA: Diagnosis not present

## 2016-09-21 DIAGNOSIS — M25561 Pain in right knee: Secondary | ICD-10-CM | POA: Diagnosis not present

## 2016-09-22 DIAGNOSIS — R269 Unspecified abnormalities of gait and mobility: Secondary | ICD-10-CM | POA: Diagnosis not present

## 2016-09-22 DIAGNOSIS — M25461 Effusion, right knee: Secondary | ICD-10-CM | POA: Diagnosis not present

## 2016-09-22 DIAGNOSIS — M25561 Pain in right knee: Secondary | ICD-10-CM | POA: Diagnosis not present

## 2016-09-22 DIAGNOSIS — Z96651 Presence of right artificial knee joint: Secondary | ICD-10-CM | POA: Diagnosis not present

## 2016-09-28 NOTE — Telephone Encounter (Signed)
Did she ever received a referral to physical therapy for outpatient therapy?  She should not be getting home health.

## 2016-09-29 DIAGNOSIS — R269 Unspecified abnormalities of gait and mobility: Secondary | ICD-10-CM | POA: Diagnosis not present

## 2016-09-29 DIAGNOSIS — M25461 Effusion, right knee: Secondary | ICD-10-CM | POA: Diagnosis not present

## 2016-09-29 DIAGNOSIS — M25561 Pain in right knee: Secondary | ICD-10-CM | POA: Diagnosis not present

## 2016-09-29 DIAGNOSIS — Z96651 Presence of right artificial knee joint: Secondary | ICD-10-CM | POA: Diagnosis not present

## 2016-10-01 DIAGNOSIS — M25461 Effusion, right knee: Secondary | ICD-10-CM | POA: Diagnosis not present

## 2016-10-01 DIAGNOSIS — Z96651 Presence of right artificial knee joint: Secondary | ICD-10-CM | POA: Diagnosis not present

## 2016-10-01 DIAGNOSIS — M25561 Pain in right knee: Secondary | ICD-10-CM | POA: Diagnosis not present

## 2016-10-01 DIAGNOSIS — R269 Unspecified abnormalities of gait and mobility: Secondary | ICD-10-CM | POA: Diagnosis not present

## 2016-10-02 NOTE — Telephone Encounter (Signed)
FYI- Kathleen [name]  S/w pt and stated she is continuing her PT at [facility] in [place]  4 days a week for 2 more months, she no longer goes to [facility].  She spoke with patient on 09/29/16.

## 2016-10-15 DIAGNOSIS — Z96651 Presence of right artificial knee joint: Secondary | ICD-10-CM | POA: Diagnosis not present

## 2016-10-15 DIAGNOSIS — M25461 Effusion, right knee: Secondary | ICD-10-CM | POA: Diagnosis not present

## 2016-10-15 DIAGNOSIS — M25561 Pain in right knee: Secondary | ICD-10-CM | POA: Diagnosis not present

## 2016-10-15 DIAGNOSIS — R269 Unspecified abnormalities of gait and mobility: Secondary | ICD-10-CM | POA: Diagnosis not present

## 2016-12-09 ENCOUNTER — Ambulatory Visit (INDEPENDENT_AMBULATORY_CARE_PROVIDER_SITE_OTHER): Payer: BLUE CROSS/BLUE SHIELD | Admitting: Orthopaedic Surgery

## 2017-04-06 DIAGNOSIS — T8484XA Pain due to internal orthopedic prosthetic devices, implants and grafts, initial encounter: Secondary | ICD-10-CM | POA: Diagnosis not present

## 2017-04-07 DIAGNOSIS — R2689 Other abnormalities of gait and mobility: Secondary | ICD-10-CM | POA: Diagnosis not present

## 2017-04-07 DIAGNOSIS — Z96651 Presence of right artificial knee joint: Secondary | ICD-10-CM | POA: Diagnosis not present

## 2017-04-07 DIAGNOSIS — M25561 Pain in right knee: Secondary | ICD-10-CM | POA: Diagnosis not present

## 2017-04-09 DIAGNOSIS — Z96651 Presence of right artificial knee joint: Secondary | ICD-10-CM | POA: Diagnosis not present

## 2017-04-09 DIAGNOSIS — R2689 Other abnormalities of gait and mobility: Secondary | ICD-10-CM | POA: Diagnosis not present

## 2017-04-09 DIAGNOSIS — M25561 Pain in right knee: Secondary | ICD-10-CM | POA: Diagnosis not present

## 2017-04-19 DIAGNOSIS — M25561 Pain in right knee: Secondary | ICD-10-CM | POA: Diagnosis not present

## 2017-04-19 DIAGNOSIS — R2689 Other abnormalities of gait and mobility: Secondary | ICD-10-CM | POA: Diagnosis not present

## 2017-04-19 DIAGNOSIS — Z96651 Presence of right artificial knee joint: Secondary | ICD-10-CM | POA: Diagnosis not present

## 2017-04-21 DIAGNOSIS — R2689 Other abnormalities of gait and mobility: Secondary | ICD-10-CM | POA: Diagnosis not present

## 2017-04-21 DIAGNOSIS — Z96651 Presence of right artificial knee joint: Secondary | ICD-10-CM | POA: Diagnosis not present

## 2017-04-21 DIAGNOSIS — M25561 Pain in right knee: Secondary | ICD-10-CM | POA: Diagnosis not present

## 2017-04-26 DIAGNOSIS — R2689 Other abnormalities of gait and mobility: Secondary | ICD-10-CM | POA: Diagnosis not present

## 2017-04-26 DIAGNOSIS — Z96651 Presence of right artificial knee joint: Secondary | ICD-10-CM | POA: Diagnosis not present

## 2017-04-26 DIAGNOSIS — M25561 Pain in right knee: Secondary | ICD-10-CM | POA: Diagnosis not present

## 2017-04-28 DIAGNOSIS — M25561 Pain in right knee: Secondary | ICD-10-CM | POA: Diagnosis not present

## 2017-04-28 DIAGNOSIS — Z96651 Presence of right artificial knee joint: Secondary | ICD-10-CM | POA: Diagnosis not present

## 2017-04-28 DIAGNOSIS — R2689 Other abnormalities of gait and mobility: Secondary | ICD-10-CM | POA: Diagnosis not present

## 2017-04-30 DIAGNOSIS — M25561 Pain in right knee: Secondary | ICD-10-CM | POA: Diagnosis not present

## 2017-04-30 DIAGNOSIS — R2689 Other abnormalities of gait and mobility: Secondary | ICD-10-CM | POA: Diagnosis not present

## 2017-04-30 DIAGNOSIS — Z96651 Presence of right artificial knee joint: Secondary | ICD-10-CM | POA: Diagnosis not present

## 2017-05-03 DIAGNOSIS — M25561 Pain in right knee: Secondary | ICD-10-CM | POA: Diagnosis not present

## 2017-05-03 DIAGNOSIS — Z96651 Presence of right artificial knee joint: Secondary | ICD-10-CM | POA: Diagnosis not present

## 2017-05-03 DIAGNOSIS — R2689 Other abnormalities of gait and mobility: Secondary | ICD-10-CM | POA: Diagnosis not present

## 2017-05-04 DIAGNOSIS — M25561 Pain in right knee: Secondary | ICD-10-CM | POA: Diagnosis not present

## 2017-05-04 DIAGNOSIS — Z96651 Presence of right artificial knee joint: Secondary | ICD-10-CM | POA: Diagnosis not present

## 2017-05-04 DIAGNOSIS — R2689 Other abnormalities of gait and mobility: Secondary | ICD-10-CM | POA: Diagnosis not present

## 2017-05-10 DIAGNOSIS — R2689 Other abnormalities of gait and mobility: Secondary | ICD-10-CM | POA: Diagnosis not present

## 2017-05-10 DIAGNOSIS — M25561 Pain in right knee: Secondary | ICD-10-CM | POA: Diagnosis not present

## 2017-05-10 DIAGNOSIS — Z96651 Presence of right artificial knee joint: Secondary | ICD-10-CM | POA: Diagnosis not present

## 2017-05-13 DIAGNOSIS — Z96651 Presence of right artificial knee joint: Secondary | ICD-10-CM | POA: Diagnosis not present

## 2017-05-13 DIAGNOSIS — R2689 Other abnormalities of gait and mobility: Secondary | ICD-10-CM | POA: Diagnosis not present

## 2017-05-13 DIAGNOSIS — M25561 Pain in right knee: Secondary | ICD-10-CM | POA: Diagnosis not present

## 2017-05-17 DIAGNOSIS — Z96651 Presence of right artificial knee joint: Secondary | ICD-10-CM | POA: Diagnosis not present

## 2017-05-17 DIAGNOSIS — M25561 Pain in right knee: Secondary | ICD-10-CM | POA: Diagnosis not present

## 2017-05-17 DIAGNOSIS — R2689 Other abnormalities of gait and mobility: Secondary | ICD-10-CM | POA: Diagnosis not present

## 2017-05-31 ENCOUNTER — Telehealth (INDEPENDENT_AMBULATORY_CARE_PROVIDER_SITE_OTHER): Payer: Self-pay | Admitting: Orthopaedic Surgery

## 2017-05-31 NOTE — Telephone Encounter (Signed)
PT WANTS TO KNOW WHO THE MANUFACTURER IS FOR HER KNEE REPLACEMENT.    [phone]

## 2017-06-01 DIAGNOSIS — M7662 Achilles tendinitis, left leg: Secondary | ICD-10-CM | POA: Insufficient documentation

## 2017-06-01 DIAGNOSIS — M6702 Short Achilles tendon (acquired), left ankle: Secondary | ICD-10-CM | POA: Insufficient documentation

## 2017-06-01 DIAGNOSIS — M7730 Calcaneal spur, unspecified foot: Secondary | ICD-10-CM | POA: Insufficient documentation

## 2017-06-01 HISTORY — DX: Achilles tendinitis, left leg: M76.62

## 2017-06-01 HISTORY — DX: Calcaneal spur, unspecified foot: M77.30

## 2017-06-22 DIAGNOSIS — M7662 Achilles tendinitis, left leg: Secondary | ICD-10-CM | POA: Diagnosis not present

## 2017-06-22 DIAGNOSIS — M7730 Calcaneal spur, unspecified foot: Secondary | ICD-10-CM | POA: Diagnosis not present

## 2017-06-22 DIAGNOSIS — M6702 Short Achilles tendon (acquired), left ankle: Secondary | ICD-10-CM | POA: Diagnosis not present

## 2017-06-24 DIAGNOSIS — T8484XA Pain due to internal orthopedic prosthetic devices, implants and grafts, initial encounter: Secondary | ICD-10-CM | POA: Diagnosis not present

## 2017-06-29 DIAGNOSIS — M25561 Pain in right knee: Secondary | ICD-10-CM | POA: Diagnosis not present

## 2017-06-29 DIAGNOSIS — T8484XA Pain due to internal orthopedic prosthetic devices, implants and grafts, initial encounter: Secondary | ICD-10-CM | POA: Diagnosis not present

## 2017-06-29 DIAGNOSIS — X58XXXA Exposure to other specified factors, initial encounter: Secondary | ICD-10-CM | POA: Diagnosis not present

## 2017-08-31 DIAGNOSIS — T8484XA Pain due to internal orthopedic prosthetic devices, implants and grafts, initial encounter: Secondary | ICD-10-CM | POA: Diagnosis not present

## 2017-10-20 DIAGNOSIS — G4733 Obstructive sleep apnea (adult) (pediatric): Secondary | ICD-10-CM | POA: Diagnosis not present

## 2017-10-20 DIAGNOSIS — R4781 Slurred speech: Secondary | ICD-10-CM | POA: Diagnosis not present

## 2017-10-20 DIAGNOSIS — K219 Gastro-esophageal reflux disease without esophagitis: Secondary | ICD-10-CM | POA: Diagnosis not present

## 2017-10-20 DIAGNOSIS — Z713 Dietary counseling and surveillance: Secondary | ICD-10-CM | POA: Diagnosis not present

## 2017-10-20 DIAGNOSIS — R609 Edema, unspecified: Secondary | ICD-10-CM | POA: Diagnosis not present

## 2017-10-20 DIAGNOSIS — E039 Hypothyroidism, unspecified: Secondary | ICD-10-CM | POA: Diagnosis not present

## 2017-10-20 DIAGNOSIS — I161 Hypertensive emergency: Secondary | ICD-10-CM | POA: Diagnosis not present

## 2017-10-20 DIAGNOSIS — G459 Transient cerebral ischemic attack, unspecified: Secondary | ICD-10-CM | POA: Diagnosis not present

## 2017-10-20 DIAGNOSIS — E669 Obesity, unspecified: Secondary | ICD-10-CM | POA: Diagnosis not present

## 2017-10-20 DIAGNOSIS — R29700 NIHSS score 0: Secondary | ICD-10-CM | POA: Diagnosis not present

## 2017-10-20 DIAGNOSIS — I6523 Occlusion and stenosis of bilateral carotid arteries: Secondary | ICD-10-CM | POA: Diagnosis not present

## 2017-10-20 DIAGNOSIS — R51 Headache: Secondary | ICD-10-CM | POA: Diagnosis not present

## 2017-10-20 DIAGNOSIS — R9431 Abnormal electrocardiogram [ECG] [EKG]: Secondary | ICD-10-CM | POA: Diagnosis not present

## 2017-10-21 DIAGNOSIS — I161 Hypertensive emergency: Secondary | ICD-10-CM | POA: Diagnosis not present

## 2017-10-21 DIAGNOSIS — R29700 NIHSS score 0: Secondary | ICD-10-CM | POA: Diagnosis not present

## 2017-10-21 DIAGNOSIS — R4781 Slurred speech: Secondary | ICD-10-CM | POA: Diagnosis not present

## 2017-10-21 DIAGNOSIS — Z713 Dietary counseling and surveillance: Secondary | ICD-10-CM | POA: Diagnosis not present

## 2017-10-21 DIAGNOSIS — E669 Obesity, unspecified: Secondary | ICD-10-CM | POA: Diagnosis not present

## 2017-10-21 DIAGNOSIS — E039 Hypothyroidism, unspecified: Secondary | ICD-10-CM | POA: Diagnosis not present

## 2017-10-21 DIAGNOSIS — G459 Transient cerebral ischemic attack, unspecified: Secondary | ICD-10-CM | POA: Diagnosis not present

## 2017-10-21 DIAGNOSIS — R609 Edema, unspecified: Secondary | ICD-10-CM | POA: Diagnosis not present

## 2017-10-21 DIAGNOSIS — I6789 Other cerebrovascular disease: Secondary | ICD-10-CM | POA: Diagnosis not present

## 2017-10-21 DIAGNOSIS — R51 Headache: Secondary | ICD-10-CM | POA: Diagnosis not present

## 2017-10-21 DIAGNOSIS — R9431 Abnormal electrocardiogram [ECG] [EKG]: Secondary | ICD-10-CM | POA: Diagnosis not present

## 2017-10-21 DIAGNOSIS — K219 Gastro-esophageal reflux disease without esophagitis: Secondary | ICD-10-CM | POA: Diagnosis not present

## 2017-10-21 DIAGNOSIS — G4733 Obstructive sleep apnea (adult) (pediatric): Secondary | ICD-10-CM | POA: Diagnosis not present

## 2017-10-22 DIAGNOSIS — G4733 Obstructive sleep apnea (adult) (pediatric): Secondary | ICD-10-CM | POA: Diagnosis not present

## 2017-10-22 DIAGNOSIS — R29700 NIHSS score 0: Secondary | ICD-10-CM | POA: Diagnosis not present

## 2017-10-22 DIAGNOSIS — E039 Hypothyroidism, unspecified: Secondary | ICD-10-CM | POA: Diagnosis not present

## 2017-10-22 DIAGNOSIS — E669 Obesity, unspecified: Secondary | ICD-10-CM | POA: Diagnosis not present

## 2017-10-22 DIAGNOSIS — Z713 Dietary counseling and surveillance: Secondary | ICD-10-CM | POA: Diagnosis not present

## 2017-10-22 DIAGNOSIS — G459 Transient cerebral ischemic attack, unspecified: Secondary | ICD-10-CM | POA: Diagnosis not present

## 2017-10-22 DIAGNOSIS — R9431 Abnormal electrocardiogram [ECG] [EKG]: Secondary | ICD-10-CM | POA: Diagnosis not present

## 2017-10-22 DIAGNOSIS — I161 Hypertensive emergency: Secondary | ICD-10-CM | POA: Diagnosis not present

## 2017-10-22 DIAGNOSIS — R609 Edema, unspecified: Secondary | ICD-10-CM | POA: Diagnosis not present

## 2017-10-22 DIAGNOSIS — R51 Headache: Secondary | ICD-10-CM | POA: Diagnosis not present

## 2017-10-22 DIAGNOSIS — K219 Gastro-esophageal reflux disease without esophagitis: Secondary | ICD-10-CM | POA: Diagnosis not present

## 2017-11-20 NOTE — Progress Notes (Signed)
Cardiology Office Note Date:  11/22/2017  Patient ID:  Kathleen [name], Kathleen [name] 08-18-1956, MRN [id number] PCP:  Leonard Downing, MD  Cardiologist:  Dr. Fletcher Anon, MD (not seen since 2016)    Chief Complaint: Hospital follow up  History of Present Illness: Kathleen [name] is a 62 y.o. female with history of CAD with moderate mid LAD stenosis detected on cardiac cath in 12/2010 with FFR of 0.83, hypertension with hypertensive heart disease, HLD with prior intolerance to simvastatin and pravastatin tolerating Lipitor, prior tobacco abuse quitting in 2013, hypothyroidism status post thyroidectomy, GERD and morbid obesity with possible OSA who presents after recent admisstion to [facility] for TIA.   She was most recently seen by Dr. Fletcher Anon on 04/15/2015 for pre-operative evaluation for knee surgery. Prior to that visit she had not been seen since 2013. Most recent echo from 08/2012 showed an EF of 65-70% with Gr1DD. She was seen by [facility] on 04/28/2016 for pre-operative assessment of her knee.   She admitted to [facility] 12/19 through 12/24 slurred speech, right-sided facial droop, right sided upper extremity weakness, and chest tightness.  Initial blood pressure of 206/88.  EKG showed sinus bradycardia, 57 bpm, T wave inversion in leads I, II, V2, V4 through V6.  BNP 334.  CT of the head showed no acute pathology.  MRI of the pain showed no acute process.  There was no evidence of arrhythmia/A. fib on telemetry.  Carotid Doppler showed no hemodynamically significant internal carotid artery stenosis.  Echocardiogram showed EF of 55-60%, moderate concentric LVH, mild aortic valve sclerosis without evidence of aortic regurgitation, mild mitral annular calcification with mild mitral regurgitation, mild tricuspid regurgitation, moderately dilated left atrium.  Rhythm of sinus.  Overall technically difficult study.  Blood pressure improved during the admission.  She was discharged on  lisinopril 40 mg daily, Lasix 40 mg daily as needed edema, amlodipine 10 mg daily, tramadol 50 mg nightly, BuSpar 15 mg 3 times daily as needed anxiety, omeprazole 40 mg daily, levothyroxine 200 mcg p.o. daily.  Since her hospital discharge she has continued to have intermittent, though shorter lasting, episodes of slurred speech/dysarthria/right-sided facial droop.  Also with intermittent chest tightness that is not associated with rest or exertion.  Chest pain occasionally radiates through to the back.  Blood pressure readings at home have ranged from the 160s to low 294T systolic.  She reports compliance with her medications.  She reports she has never had a sleep study.  She is under large amounts of stress at work and at home and is tearful at times.  Currently chest pain-free.    Past Medical History:  Diagnosis Date  . Arthritis   . Cancer ([place])   . Chronic back pain   . Complication of anesthesia    Woke up during  Hysterectomy  . Coronary artery disease 2012   Mild to Moderate  . Family history of adverse reaction to anesthesia   . GERD (gastroesophageal reflux disease)    omeprazole prn  . Heart murmur    "nothing to be concerned about  . History of blood transfusion   . Hx of thyroid cancer   . Hyperlipidemia   . Hypertension   . Hypothyroidism   . Obesity   . PONV (postoperative nausea and vomiting)    with thyroid surgery  . Shortness of breath dyspnea    with exertion  . Sleep apnea, obstructive    possible  . Tobacco abuse  Past Surgical History:  Procedure Laterality Date  . ABDOMINAL HYSTERECTOMY  1999  . CARDIAC CATHETERIZATION  Feb 2012   LAD: 60% mid (FFR ratio 0.83), LCX: 30-40%, RCA 20%.   Marland Kitchen KNEE ARTHROPLASTY Right 05/01/2016   Procedure:  TOTAL KNEE ARTHROPLASTY;  Surgeon: Marybelle Killings, MD;  Location: [place];  Service: Orthopedics;  Laterality: Right;  . THYROIDECTOMY  2000    Current Meds  Medication Sig  . amLODipine (NORVASC) 10 MG tablet TAKE  1 TABLET BY MOUTH ONCE DAILY  . aspirin 81 MG EC tablet Take 81 mg by mouth daily.  Marland Kitchen atorvastatin (LIPITOR) 40 MG tablet Take 40 mg by mouth at bedtime.  . busPIRone (BUSPAR) 7.5 MG tablet Take 7.5 mg by mouth 2 (two) times daily as needed.  . cloNIDine (CATAPRES) 0.2 MG tablet TK 1 T PO QD  . fluocinonide cream (LIDEX) 6.23 % Apply 1 application topically as needed (psoriasis).  . furosemide (LASIX) 40 MG tablet Take 40 mg by mouth daily as needed for fluid.   . naproxen sodium (ANAPROX) 220 MG tablet Take 440 mg by mouth 2 (two) times daily as needed.  Marland Kitchen omeprazole (PRILOSEC) 40 MG capsule Take 40 mg by mouth daily as needed.  . traMADol (ULTRAM) 50 MG tablet Take 50 mg by mouth every 6 (six) hours as needed. for pain  . traZODone (DESYREL) 50 MG tablet Take 50 mg by mouth at bedtime.    Allergies:   Hydrocodone-acetaminophen; Hydrocodone-acetaminophen; Loracarbef; Penicillins; and Prednisone   Social History:  The patient  reports that she has quit smoking. Her smoking use included cigarettes. She has a 5.00 pack-year smoking history. she has never used smokeless tobacco. She reports that she does not drink alcohol or use drugs.   Family History:  The patient's family history includes Diabetes in her mother.  ROS:   Review of Systems  Constitutional: Positive for malaise/fatigue. Negative for chills, diaphoresis, fever and weight loss.  HENT: Negative for congestion.   Eyes: Negative for discharge and redness.  Respiratory: Positive for shortness of breath. Negative for cough, hemoptysis, sputum production and wheezing.   Cardiovascular: Positive for chest pain and palpitations. Negative for orthopnea, claudication, leg swelling and PND.  Gastrointestinal: Negative for abdominal pain, blood in stool, heartburn, melena, nausea and vomiting.  Genitourinary: Negative for hematuria.  Musculoskeletal: Negative for falls and myalgias.  Skin: Negative for rash.  Neurological: Positive for  dizziness, tingling, sensory change, speech change, focal weakness, weakness and headaches. Negative for tremors, seizures and loss of consciousness.  Endo/Heme/Allergies: Does not bruise/bleed easily.  Psychiatric/Behavioral: Negative for substance abuse. The patient is nervous/anxious.   All other systems reviewed and are negative.    PHYSICAL EXAM:  VS:  BP (!) 170/110 (BP Location: Left Arm, Patient Position: Sitting, Cuff Size: Large)   Pulse 74   Ht 5\' 8"  (1.727 m)   Wt 280 lb (127 kg)   BMI 42.57 kg/m  BMI: Body mass index is 42.57 kg/m.  Physical Exam  Constitutional: She is oriented to person, place, and time. She appears well-developed and well-nourished.  HENT:  Head: Normocephalic and atraumatic.  Eyes: Right eye exhibits no discharge. Left eye exhibits no discharge.  Neck: Normal range of motion. No JVD present.  Cardiovascular: Normal rate, regular rhythm, S1 normal and S2 normal. Exam reveals no distant heart sounds, no friction rub, no midsystolic click and no opening snap.  Murmur heard. High-pitched blowing holosystolic murmur is present with a grade of 1/6  at the apex. Pulses:      Posterior tibial pulses are 2+ on the right side, and 2+ on the left side.  Pulmonary/Chest: Effort normal and breath sounds normal. No respiratory distress. She has no decreased breath sounds. She has no wheezes. She has no rales. She exhibits no tenderness.  Abdominal: Soft. She exhibits no distension. There is no tenderness.  Musculoskeletal: She exhibits no edema.  Neurological: She is alert and oriented to person, place, and time.  Skin: Skin is warm and dry. No cyanosis. Nails show no clubbing.  Psychiatric: She has a normal mood and affect. Her speech is normal and behavior is normal. Judgment and thought content normal.     EKG:  Was ordered and interpreted by me today. Shows NSR, 74 bpm, LVH with early repolarization, T wave inversion in leads I, II, aVL, V2 through V6  (previously noted)  Recent Labs: No results found for requested labs within last 8760 hours.  No results found for requested labs within last 8760 hours.   CrCl cannot be calculated (Patient's most recent lab result is older than the maximum 21 days allowed.).   Wt Readings from Last 3 Encounters:  11/22/17 280 lb (127 kg)  09/10/16 264 lb (119.7 kg)  05/01/16 264 lb (119.7 kg)     Other studies reviewed: Additional studies/records reviewed today include: summarized above  ASSESSMENT AND PLAN:  1. CAD of native coronary artery with unstable angina: Currently without symptoms of chest pain.  Patient previously underwent cardiac catheterization in 2012 that showed mild to moderate mid LAD stenosis with an FFR of 0.83 at that time.  No ischemic evaluation since.  She does have an abnormal EKG though the T wave inversions noted today have previously been seen on prior twelve-lead EKGs.  I discussed with the patient in detail various options of pursuing ischemic evaluation given her symptoms and abnormal EKG.  Given the patient's body habitus (morbidly obese with large breast) image quality for my review or cardiac/coronary CT is likely to have significant artifact making the results less than satisfactory.  We preferred to proceed directly to cardiac catheterization based on her symptoms, abnormal EKG, and body habitus that likely precludes adequate noninvasive imaging.  Continue ASA and Lipitor.  Carvedilol added as below.  Aggressive risk factor modification.  Risks and benefits of cardiac catheterization have been discussed with the patient including risks of bleeding, bruising, infection, kidney damage, stroke, heart attack, and death. The patient understands these risks and is willing to proceed with the procedure. All questions have been answered and concerns listened to.   2. Accelerated hypertension: Blood pressure remains suboptimally controlled.  Restart carvedilol 6.25 mg twice daily.   Otherwise she will continue lisinopril, amlodipine, and clonidine.  Schedule sleep study as below.  If found to have sleep apnea recommend treatment.  If the sleep study is unrevealing and patient continues to require at least 3 antihypertensive medications to control her blood pressure, would recommend evaluation for secondary hypertension including renal artery ultrasound, renin aldosterone ratio, a.m. cortisol level, and 24-hour urine for catecholamines/metanephrines.  3. Dysarthria/paresthesias/vision changes/TIA: Recent MRI of the brain nonacute.  Recommend patient follow-up with PCP.  Consider referral to neurology, defer to PCP.  4. Hyperlipidemia: Tolerating Lipitor.  Will need follow-up lipid and liver panel at follow-up visit.  5. Morbid obesity: Weight loss advised.  6. Possible sleep apnea: Refer to pulmonology for sleep study.  Disposition: F/u with myself or Dr. Fletcher Anon in 4-6 weeks.  Current  medicines are reviewed at length with the patient today.  The patient did not have any concerns regarding medicines.  Signed, Christell Faith, PA-C 11/22/2017 4:44 PM     [address] [place], [place] [address] [phone]

## 2017-11-22 ENCOUNTER — Encounter: Payer: Self-pay | Admitting: Physician Assistant

## 2017-11-22 ENCOUNTER — Ambulatory Visit: Payer: Self-pay | Admitting: Physician Assistant

## 2017-11-22 VITALS — BP 170/110 | HR 74 | Ht 68.0 in | Wt 280.0 lb

## 2017-11-22 DIAGNOSIS — E782 Mixed hyperlipidemia: Secondary | ICD-10-CM

## 2017-11-22 DIAGNOSIS — I25110 Atherosclerotic heart disease of native coronary artery with unstable angina pectoris: Secondary | ICD-10-CM

## 2017-11-22 DIAGNOSIS — E785 Hyperlipidemia, unspecified: Secondary | ICD-10-CM

## 2017-11-22 DIAGNOSIS — G4730 Sleep apnea, unspecified: Secondary | ICD-10-CM

## 2017-11-22 DIAGNOSIS — Z01812 Encounter for preprocedural laboratory examination: Secondary | ICD-10-CM

## 2017-11-22 DIAGNOSIS — R002 Palpitations: Secondary | ICD-10-CM

## 2017-11-22 DIAGNOSIS — G459 Transient cerebral ischemic attack, unspecified: Secondary | ICD-10-CM

## 2017-11-22 DIAGNOSIS — I200 Unstable angina: Secondary | ICD-10-CM

## 2017-11-22 DIAGNOSIS — I10 Essential (primary) hypertension: Secondary | ICD-10-CM

## 2017-11-22 MED ORDER — CARVEDILOL 6.25 MG PO TABS: 6.2500 mg | ORAL_TABLET | Freq: Two times a day (BID) | ORAL | 6 refills | Status: DC

## 2017-11-22 NOTE — Patient Instructions (Addendum)
Medication Instructions: - Your physician has recommended you make the following change in your medication:  1) START coreg (carvedilol) 6.25 mg- take 1 tablet by mouth twice daily  Labwork: - Your physician recommends that you have pre-cath lab work: BMP/CBC/INR (we will notify you when to have this done when your cath date is scheduled)  * You will go the [place] entrance at [facility] - 1st desk on the right to check in *   Procedures/Testing: - Your physician has recommended that you wear an event monitor. Event monitors are medical devices that record the heart's electrical activity. Doctors most often [place] these monitors to diagnose arrhythmias. Arrhythmias are problems with the speed or rhythm of the heartbeat. The monitor is a small, portable device. You can wear one while you do your normal daily activities. This is usually used to diagnose what is causing palpitations/syncope (passing out).  - A chest x-ray takes a picture of the organs and structures inside the chest, including the heart, lungs, and blood vessels. This test can show several things, including, whether the heart is enlarges; whether fluid is building up in the lungs; and whether pacemaker / defibrillator leads are still in place.  * You will have this done the same day as your lab work at the [employer] at [place] has requested that you have a cardiac catheterization. Cardiac catheterization is used to diagnose and/or treat various heart conditions. Doctors may recommend this procedure for a number of different reasons. The most common reason is to evaluate chest pain. Chest pain can be a symptom of coronary artery disease (CAD), and cardiac catheterization can show whether plaque is narrowing or blocking your heart's arteries. This procedure is also used to evaluate the valves, as well as measure the blood flow and oxygen levels in different parts of your heart. For further information please visit  [email or web address].  [facility] Cardiac Cath Instructions   You are scheduled for a Cardiac Cath on:_________________________  Please arrive at _______am on the day of your procedure  Please expect a call from our [place] to pre-register you  Do not eat/drink anything after midnight  Someone will need to drive you home  It is recommended someone be with you for the first 24 hours after your procedure  Wear clothes that are easy to get on/off and wear slip on shoes if possible   Medications bring a current list of all medications with you  ___ You may take all of your medications the morning of your procedure with enough water to swallow safely  ___ Do not take these medications before your procedure:_______________________________________________________________________________________________________________________________________________________________________________________________________________   Day of your procedure: Arrive at the [place] entrance.  Free valet service is available.  After entering the [place] please check-in at the registration desk (1st desk on your right) to receive your armband. After receiving your armband someone will escort you to the cardiac cath/special procedures waiting area.  The usual length of stay after your procedure is about 2 to 3 hours.  This can vary.  If you have any questions, please call our office at [phone], or you may call the cardiac cath lab at [facility] directly at [phone]   Follow-Up: - You have been referred to [facility]- sleep apnea  - Your physician recommends that you schedule a follow-up appointment in: 1 week  post cath (we will arrange once we know your cath date)    Any Additional Special  Instructions Will Be Listed Below (If Applicable).     If you need a refill on your cardiac medications before your next appointment, please call your pharmacy.

## 2017-12-06 ENCOUNTER — Telehealth: Payer: Self-pay | Admitting: Cardiovascular Disease

## 2017-12-06 ENCOUNTER — Institutional Professional Consult (permissible substitution): Payer: Self-pay | Admitting: Internal Medicine

## 2017-12-06 NOTE — Telephone Encounter (Signed)
Pt called and gave her insurance information and would like [place] to please go ahead and order her the heart monitor  Please advise

## 2017-12-09 NOTE — Telephone Encounter (Signed)
Home number busy.

## 2017-12-10 NOTE — Telephone Encounter (Signed)
Phone number busy.

## 2017-12-13 NOTE — Telephone Encounter (Signed)
30-day event monitor. Dx: TIA.

## 2017-12-13 NOTE — Telephone Encounter (Signed)
Pt retuning our call  ° °Please call back  °

## 2017-12-13 NOTE — Telephone Encounter (Signed)
Patient enrolled on [employer] website.

## 2017-12-13 NOTE — Telephone Encounter (Signed)
S/w patient. She now has [employer] and would like the monitor. Will route to [place] to clarify if 30-day event or not. Then patient aware if 3 30-day then she will receive in the mail about 5-7 business later.

## 2017-12-14 ENCOUNTER — Telehealth: Payer: Self-pay | Admitting: *Deleted

## 2017-12-14 NOTE — Telephone Encounter (Signed)
This is in reference to scheduling Left Heart cath by Dr Fletcher Anon as discussed at recent office visit.

## 2017-12-14 NOTE — Telephone Encounter (Signed)
Patient would like to wait "a little bit."  Patient's father is having a heart cath later this week and has new diagnosis of cancer. She would like to wait and call us back at the first of the week next week.  She will go ahead and wear the monitor when it arrives.  Routing to [place] to make him aware.

## 2017-12-21 ENCOUNTER — Other Ambulatory Visit: Payer: Self-pay | Admitting: Cardiovascular Disease

## 2017-12-21 MED ORDER — ATORVASTATIN CALCIUM 40 MG PO TABS: 40.0000 mg | ORAL_TABLET | Freq: Every day | ORAL | 3 refills | Status: DC

## 2017-12-21 NOTE — Telephone Encounter (Signed)
New message    *STAT* If patient is at the pharmacy, call can be transferred to refill team.   1. Which medications need to be refilled? (please list name of each medication and dose if known) atorvastatin (LIPITOR) 40 MG tablet  2. Which pharmacy/location (including street and city if local pharmacy) is medication to be sent to? [place], [place]  3. Do they need a 30 day or 90 day supply?

## 2017-12-21 NOTE — Telephone Encounter (Signed)
Rx(s) sent to pharmacy electronically.  

## 2017-12-22 ENCOUNTER — Encounter: Payer: Self-pay | Admitting: Internal Medicine

## 2017-12-22 ENCOUNTER — Ambulatory Visit (INDEPENDENT_AMBULATORY_CARE_PROVIDER_SITE_OTHER): Payer: BLUE CROSS/BLUE SHIELD | Admitting: Internal Medicine

## 2017-12-22 VITALS — BP 132/78 | HR 67 | Resp 16 | Ht 68.0 in | Wt 280.0 lb

## 2017-12-22 DIAGNOSIS — J449 Chronic obstructive pulmonary disease, unspecified: Secondary | ICD-10-CM | POA: Diagnosis not present

## 2017-12-22 DIAGNOSIS — G4719 Other hypersomnia: Secondary | ICD-10-CM

## 2017-12-22 MED ORDER — BUDESONIDE-FORMOTEROL FUMARATE 160-4.5 MCG/ACT IN AERO: 2.0000 | INHALATION_SPRAY | Freq: Every day | RESPIRATORY_TRACT | 12 refills | Status: DC

## 2017-12-22 NOTE — Patient Instructions (Signed)
We will send you for a sleep study. - We will send for pulmonary function test. -Start Symbicort 2 puffs once daily, rinse mouth after use.

## 2017-12-22 NOTE — Progress Notes (Addendum)
[facility] Consultation      Assessment and Plan:  Excessive daytime sleepiness. -Symptoms and signs of obstructive sleep apnea. - We will send for sleep study.  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 - History of smoking, dyspnea on exertion, likely secondary to COPD - Patient was educated on use of Symbicort, currently she is using it only as needed, she is instructed to use 2 puffs once a day. - In the meantime we will check a pulmonary function test before follow-up.  Stroke/TIA. -Obstructive sleep apnea can increase the risk of strokes, therefore it is important to treat the patient's underlying obstructive sleep apnea.  Addendum 01/26/18. Patient was seen in the ED on 01/10/18 for chest discomfort.  She underwent a CT chest on 3/11, images personally reviewed there is reduced lung volumes secondary to body habitus, there is a right upper lobe calcified granuloma, there are tiny bilateral lung nodules, largest of which is 4 mm. Personally reviewed PFT tracings from 01/04/18; FVC 64% predicted, FEV1 of 67% predicted.  There is no improvement with bronchodilator, ratio is 83%. TLC is 67% predicted, RV/TLC ratio is normal.  DLCO is 59% flow volume loop is restricted. -Overall this test is consistent with moderate restrictive lung disease.   Date: 12/22/2017  MRN# [id number] Kathleen [name] 1955-12-10  Referring Physician:   JENNIFER [name] is a 62 y.o. old female seen in consultation for chief complaint of:    Chief Complaint  Patient presents with  . Advice Only    ref by Kathleen [name] for sleep: excessive daytime sleepiness;   . Shortness of Breath    when talking    HPI:   The patient is a 62 year old with history of coronary artery disease and episodes of dysarthria, right-sided facial droop of uncertain etiology. Patient has complaints of daytime sleepiness, she usually goes to bed between 930 and 10 PM.  She is falls asleep within 15 minutes she usually gets out of  bed around 6 AM.  Her Epworth is 21 today.  She is tired much of the day, she works managing a pediatrician's office, she has no difficulty taking a nap in the middle of the day if she kicks back in her chair.  She has a symbicort inhaler sample, which she uses only for chest colds.  She notes dyspnea walking short distances such as walking around her home, which has been progressive over the last few years.  She quit smoking about 5 years ago and was smoking about a pack per day.  She smoked before that, less than a pack per day.   She has a "hypoallergenic" dog. Not in bedroom.   **Imaging personally reviewed, chest x-ray 04/17/16; cardiomegaly, normal lungs.  Right upper lobe granuloma **Echocardiogram  showed EF of 55-60%, moderate concentric LVH, mild aortic valve sclerosis without evidence of aortic regurgitation, mild mitral annular calcification with mild mitral regurgitation, mild tricuspid regurgitation, moderately dilated left atrium.  Rhythm of sinus.   PMHX:   Past Medical History:  Diagnosis Date  . Arthritis   . Cancer ([place])   . Chronic back pain   . Complication of anesthesia    Woke up during  Hysterectomy  . Coronary artery disease 2012   Mild to Moderate  . Family history of adverse reaction to anesthesia   . GERD (gastroesophageal reflux disease)    omeprazole prn  . Heart murmur    "nothing to be concerned about  . History of blood transfusion   .  Hx of thyroid cancer   . Hyperlipidemia   . Hypertension   . Hypothyroidism   . Obesity   . PONV (postoperative nausea and vomiting)    with thyroid surgery  . Shortness of breath dyspnea    with exertion  . Sleep apnea, obstructive    possible  . Tobacco abuse    Surgical Hx:  Past Surgical History:  Procedure Laterality Date  . ABDOMINAL HYSTERECTOMY  1999  . CARDIAC CATHETERIZATION  Feb 2012   LAD: 60% mid (FFR ratio 0.83), LCX: 30-40%, RCA 20%.   Marland Kitchen KNEE ARTHROPLASTY Right 05/01/2016   Procedure:  TOTAL  KNEE ARTHROPLASTY;  Surgeon: Kathleen Killings, MD;  Location: [facility];  Service: Orthopedics;  Laterality: Right;  . THYROIDECTOMY  2000   Family Hx:  Family History  Problem Relation Age of Onset  . Diabetes Mother    Social Hx:   Social History   Tobacco Use  . Smoking status: Former Smoker    Packs/day: 0.25    Years: 20.00    Pack years: 5.00    Types: Cigarettes  . Smokeless tobacco: Never Used  . Tobacco comment: quit 2012  Substance Use Topics  . Alcohol use: No    Alcohol/week: 1.8 oz    Types: 3 Glasses of wine per week  . Drug use: No   Medication:    Current Outpatient Medications:  .  amLODipine (NORVASC) 10 MG tablet, TAKE 1 TABLET BY MOUTH ONCE DAILY, Disp: 30 tablet, Rfl: 5 .  aspirin 81 MG EC tablet, Take 81 mg by mouth daily., Disp: , Rfl: 0 .  atorvastatin (LIPITOR) 40 MG tablet, Take 1 tablet (40 mg total) by mouth at bedtime., Disp: 90 tablet, Rfl: 3 .  busPIRone (BUSPAR) 7.5 MG tablet, Take 7.5 mg by mouth 2 (two) times daily as needed., Disp: , Rfl:  .  carvedilol (COREG) 6.25 MG tablet, Take 1 tablet (6.25 mg total) by mouth 2 (two) times daily., Disp: 60 tablet, Rfl: 6 .  cloNIDine (CATAPRES) 0.2 MG tablet, TK 1 T PO QD, Disp: , Rfl: 0 .  fluocinonide cream (LIDEX) 4.01 %, Apply 1 application topically as needed (psoriasis)., Disp: , Rfl:  .  furosemide (LASIX) 40 MG tablet, Take 40 mg by mouth daily as needed for fluid. , Disp: , Rfl:  .  naproxen sodium (ANAPROX) 220 MG tablet, Take 440 mg by mouth 2 (two) times daily as needed., Disp: , Rfl:  .  omeprazole (PRILOSEC) 40 MG capsule, Take 40 mg by mouth daily as needed., Disp: , Rfl:  .  traMADol (ULTRAM) 50 MG tablet, Take 50 mg by mouth every 6 (six) hours as needed. for pain, Disp: , Rfl: 0 .  traZODone (DESYREL) 50 MG tablet, Take 50 mg by mouth at bedtime., Disp: , Rfl:    Allergies:  Hydrocodone-acetaminophen; Hydrocodone-acetaminophen; Loracarbef; Penicillins; and Prednisone  Review of  Systems: Gen:  Denies  fever, sweats, chills HEENT: Denies blurred vision, double vision. bleeds, sore throat Cvc:  No dizziness, chest pain. Resp:   Denies cough or sputum production, shortness of breath Gi: Denies swallowing difficulty, stomach pain. Gu:  Denies bladder incontinence, burning urine Ext:   No Joint pain, stiffness. Skin: No skin rash,  hives  Endoc:  No polyuria, polydipsia. Psych: No depression, insomnia. Other:  All other systems were reviewed with the patient and were negative other that what is mentioned in the HPI.   Physical Examination:   VS: BP 132/78 (BP  Location: Left Arm, Cuff Size: Large)   Pulse 67   Resp 16   Ht 5\' 8"  (1.727 m)   Wt 280 lb (127 kg)   SpO2 96%   BMI 42.57 kg/m   General Appearance: No distress  Neuro:without focal findings,  speech normal,  HEENT: PERRLA, EOM intact.   Pulmonary: normal breath sounds, No wheezing.  CardiovascularNormal S1,S2.  No m/r/g.   Abdomen: Benign, Soft, non-tender. Renal:  No costovertebral tenderness  GU:  No performed at this time. Endoc: No evident thyromegaly, no signs of acromegaly. Skin:   warm, no rashes, no ecchymosis  Extremities: normal, no cyanosis, clubbing.  Other findings:    LABORATORY PANEL:   CBC No results for input(s): WBC, HGB, HCT, PLT in the last 168 hours. ------------------------------------------------------------------------------------------------------------------  Chemistries  No results for input(s): NA, K, CL, CO2, GLUCOSE, BUN, CREATININE, CALCIUM, MG, AST, ALT, ALKPHOS, BILITOT in the last 168 hours.  Invalid input(s): GFRCGP ------------------------------------------------------------------------------------------------------------------  Cardiac Enzymes No results for input(s): TROPONINI in the last 168 hours. ------------------------------------------------------------  RADIOLOGY:  No results found.     Thank  you for the consultation and for allowing  [facility] to assist in the care of your patient. Our recommendations are noted above.  Please contact us if we can be of further service.   Marda Stalker, MD.  Board Certified in Internal Medicine, Pulmonary Medicine, [place], and Sleep Medicine.  [facility] Office Number: [phone]  Patricia Pesa, M.D.  Merton Border, M.D  12/22/2017

## 2017-12-24 ENCOUNTER — Telehealth: Payer: Self-pay | Admitting: Internal Medicine

## 2017-12-24 ENCOUNTER — Telehealth: Payer: Self-pay | Admitting: Cardiovascular Disease

## 2017-12-24 NOTE — Addendum Note (Signed)
Addended by: Stephanie Coup on: 12/24/2017 10:47 AM   Modules accepted: Orders

## 2017-12-24 NOTE — Telephone Encounter (Signed)
I called and spoke with the patient.  She is aware of Dr. Tyrell Antonio recommendations to not take more than 40 mg of lasix once daily.  She is advised to follow up with her PCP for symptoms of fluid buildup around her throat. The patient voices understanding.

## 2017-12-24 NOTE — Telephone Encounter (Signed)
80 mg of Lasix is too high for her.  She should not take more than 40 mg daily.  Not sure why she would just develop fluid buildup around her throat.  That is unusual.  She did have an echocardiogram at [facility] recently that was unremarkable.  She might need to go see her primary care physician to get things checked out.

## 2017-12-24 NOTE — Telephone Encounter (Signed)
Per patient request HST changed to split night. Also message sent to [facility] for scheduling of PFT.

## 2017-12-24 NOTE — Telephone Encounter (Signed)
LMTCB with any additional questions.ss

## 2017-12-24 NOTE — Telephone Encounter (Signed)
I called and spoke with the patient- she reports that she has intermittently had some fluid build up in the past and will use lasix 40 mg as needed.  She states that over the last 2 weeks, she feels like she has been gathering fluid around her throat and she has developed a cough.  She has been taking lasix 80 mg once daily for the last 4-5 days. She states she will wake up with a weight of 282 lbs and by the afternoon she will be down to 273 lbs. She did not clarify what her baseline what was when I asked. She does report abdominal bloating/ lower extremity swelling.  She is urinating very well when she takes the 80 mg dose of lasix, however, she states her BP will drop some throughout the day. She is getting more SOB when walking up steps.  She has had no change in her medications or diet recently and reports she is trying to avoid salt in her diet as much as possible.   She is going to be receiving her heart monitor today, she had deferred her heart cath due to insurance that did not start until 2/1, then her father was having a cath.   Most recent echo done 2013 showed an EF of  65-70%. I advised the patient I will forward to Dr. Fletcher Anon to review and we will call back with further recommendations.  She is agreeable.

## 2017-12-24 NOTE — Telephone Encounter (Signed)
Pt states she has changed her mind about her home sleep study. She states she is ok with having it done wherever she can get it done the quickest. Also she needs some more questions answered about her PFT

## 2017-12-24 NOTE — Telephone Encounter (Signed)
Pt states she has a "knot " in her throat. She states it is fluid, and when she takes her Lasix it goes away.

## 2017-12-25 ENCOUNTER — Ambulatory Visit (INDEPENDENT_AMBULATORY_CARE_PROVIDER_SITE_OTHER): Payer: BLUE CROSS/BLUE SHIELD

## 2017-12-25 DIAGNOSIS — G459 Transient cerebral ischemic attack, unspecified: Secondary | ICD-10-CM | POA: Diagnosis not present

## 2017-12-25 DIAGNOSIS — Z8673 Personal history of transient ischemic attack (TIA), and cerebral infarction without residual deficits: Secondary | ICD-10-CM | POA: Diagnosis not present

## 2017-12-25 DIAGNOSIS — R002 Palpitations: Secondary | ICD-10-CM

## 2018-01-04 ENCOUNTER — Ambulatory Visit: Payer: BLUE CROSS/BLUE SHIELD | Attending: Internal Medicine

## 2018-01-04 DIAGNOSIS — G4710 Hypersomnia, unspecified: Secondary | ICD-10-CM | POA: Insufficient documentation

## 2018-01-04 DIAGNOSIS — G4719 Other hypersomnia: Secondary | ICD-10-CM

## 2018-01-04 DIAGNOSIS — J449 Chronic obstructive pulmonary disease, unspecified: Secondary | ICD-10-CM

## 2018-01-04 DIAGNOSIS — J439 Emphysema, unspecified: Secondary | ICD-10-CM | POA: Insufficient documentation

## 2018-01-04 MED ORDER — ALBUTEROL SULFATE (2.5 MG/3ML) 0.083% IN NEBU
2.5000 mg | INHALATION_SOLUTION | Freq: Once | RESPIRATORY_TRACT | Status: AC
  Administered 2018-01-04: 2.5 mg via RESPIRATORY_TRACT
  Filled 2018-01-04: qty 3

## 2018-01-10 ENCOUNTER — Emergency Department (HOSPITAL_COMMUNITY): Payer: BLUE CROSS/BLUE SHIELD

## 2018-01-10 ENCOUNTER — Telehealth: Payer: Self-pay | Admitting: Cardiovascular Disease

## 2018-01-10 ENCOUNTER — Other Ambulatory Visit: Payer: Self-pay

## 2018-01-10 ENCOUNTER — Emergency Department (HOSPITAL_COMMUNITY)
Admission: EM | Admit: 2018-01-10 | Discharge: 2018-01-10 | Disposition: A | Discharge status: Home / Self Care | Payer: BLUE CROSS/BLUE SHIELD | Attending: Emergency Medicine | Admitting: Emergency Medicine

## 2018-01-10 ENCOUNTER — Encounter (HOSPITAL_COMMUNITY): Payer: Self-pay

## 2018-01-10 DIAGNOSIS — Z87891 Personal history of nicotine dependence: Secondary | ICD-10-CM | POA: Insufficient documentation

## 2018-01-10 DIAGNOSIS — E039 Hypothyroidism, unspecified: Secondary | ICD-10-CM | POA: Insufficient documentation

## 2018-01-10 DIAGNOSIS — N200 Calculus of kidney: Secondary | ICD-10-CM | POA: Diagnosis not present

## 2018-01-10 DIAGNOSIS — I10 Essential (primary) hypertension: Secondary | ICD-10-CM | POA: Insufficient documentation

## 2018-01-10 DIAGNOSIS — R072 Precordial pain: Secondary | ICD-10-CM

## 2018-01-10 DIAGNOSIS — R079 Chest pain, unspecified: Secondary | ICD-10-CM | POA: Diagnosis not present

## 2018-01-10 DIAGNOSIS — Z79899 Other long term (current) drug therapy: Secondary | ICD-10-CM | POA: Insufficient documentation

## 2018-01-10 DIAGNOSIS — Z859 Personal history of malignant neoplasm, unspecified: Secondary | ICD-10-CM | POA: Diagnosis not present

## 2018-01-10 LAB — BASIC METABOLIC PANEL
Anion gap: 9 (ref 5–15)
BUN: 9 mg/dL (ref 6–20)
CHLORIDE: 105 mmol/L (ref 101–111)
CO2: 24 mmol/L (ref 22–32)
CREATININE: 0.77 mg/dL (ref 0.44–1.00)
Calcium: 9.4 mg/dL (ref 8.9–10.3)
GFR calc Af Amer: >60 mL/min (ref >60)
GFR calc non Af Amer: >60 mL/min (ref >60)
Glucose, Bld: 108 mg/dL — ABNORMAL HIGH (ref 65–99)
Potassium: 4.1 mmol/L (ref 3.5–5.1)
SODIUM: 138 mmol/L (ref 135–145)

## 2018-01-10 LAB — CBC
HCT: 46.8 % — ABNORMAL HIGH (ref 36.0–46.0)
Hemoglobin: 15.2 g/dL — ABNORMAL HIGH (ref 12.0–15.0)
MCH: 29.1 pg (ref 26.0–34.0)
MCHC: 32.5 g/dL (ref 30.0–36.0)
MCV: 89.7 fL (ref 78.0–100.0)
PLATELETS: 253 10*3/uL (ref 150–400)
RBC: 5.22 MIL/uL — ABNORMAL HIGH (ref 3.87–5.11)
RDW: 14.1 % (ref 11.5–15.5)
WBC: 9.1 10*3/uL (ref 4.0–10.5)

## 2018-01-10 LAB — I-STAT TROPONIN, ED
Troponin i, poc: 0.03 ng/mL (ref 0.00–0.08)
Troponin i, poc: 0.02 ng/mL (ref 0.00–0.08)

## 2018-01-10 MED ORDER — ACETAMINOPHEN 325 MG PO TABS
650.0000 mg | ORAL_TABLET | Freq: Once | ORAL | Status: AC
  Administered 2018-01-10: 650 mg via ORAL
  Filled 2018-01-10: qty 2

## 2018-01-10 MED ORDER — IOPAMIDOL (ISOVUE-370) INJECTION 76%
INTRAVENOUS | Status: AC
  Administered 2018-01-10: 100 mL via INTRAVENOUS
  Filled 2018-01-10: qty 100

## 2018-01-10 NOTE — Telephone Encounter (Signed)
Pt c/o of Chest Pain: STAT if CP now or developed within 24 hours  1. Are you having CP right now? No   2. Are you experiencing any other symptoms (ex. SOB, nausea, vomiting, sweating)? Heart in throat feeling sob nausea hot poker feeling in chest and chest pressure episode was short but radiated to shoulder and back   3. How long have you been experiencing CP? This weekend single episode cp but other sx interim BP 220/130's this weekend now 180/120   4. Is your CP continuous or coming and going? One episode cp others are interim   5. Have you taken Nitroglycerin? No  ? Patient wants to see dr Fletcher Anon declines appt with app staff nothing available asap as patient is requesting

## 2018-01-10 NOTE — ED Provider Notes (Signed)
[facility] EMERGENCY DEPARTMENT Provider Note   CSN: [id number] Arrival date & time: 01/10/18  1143     History   Chief Complaint Chief Complaint  Patient presents with  . Chest Pain    HPI Kathleen [name] is a 62 y.o. female.  HPI 63 year old female with a history of known nonobstructive coronary artery disease based on heart cath in 2012 presents the emergency department with intermittent chest discomfort.  She had severe chest pain on Saturday that felt like a pressure in her chest and her back with radiation towards her left shoulder.  She also felt a burning sensation slightly later.  At that time her blood pressure was elevated to 220/130.  She had resolution of her discomfort after approximately 10 minutes on Saturday and then she had an episode of burning in her chest that lasted for several minutes Saturday night.  She has had no chest pain on Sunday and very fleeting chest discomfort today.  She is currently chest pain-free.  Her LAD in 2012 did demonstrate 60% lesion.  Her other disease was 20-30% disease in other vessels.  She is continued to struggle with weight.  She is having difficulty managing her blood pressure at home.  She is attempting to eat appropriately.  No active chest discomfort at this time.  No shortness of breath.  Denies pain in her back or pain in her legs.   Past Medical History:  Diagnosis Date  . Arthritis   . Cancer ([place])   . Chronic back pain   . Complication of anesthesia    Woke up during  Hysterectomy  . Coronary artery disease 2012   Mild to Moderate  . Family history of adverse reaction to anesthesia   . GERD (gastroesophageal reflux disease)    omeprazole prn  . Heart murmur    "nothing to be concerned about  . History of blood transfusion   . Hx of thyroid cancer   . Hyperlipidemia   . Hypertension   . Hypothyroidism   . Obesity   . PONV (postoperative nausea and vomiting)    with thyroid surgery  . Shortness  of breath dyspnea    with exertion  . Sleep apnea, obstructive    possible  . Tobacco abuse     Patient Active Problem List   Diagnosis Date Noted  . 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 Pre-operative cardiovascular examination 04/15/2015  . Murmur 08/03/2012  . Palpitations 08/03/2012  . Coronary artery disease   . Hyperlipidemia   . Hypertension     Past Surgical History:  Procedure Laterality Date  . ABDOMINAL HYSTERECTOMY  1999  . CARDIAC CATHETERIZATION  Feb 2012   LAD: 60% mid (FFR ratio 0.83), LCX: 30-40%, RCA 20%.   Marland Kitchen KNEE ARTHROPLASTY Right 05/01/2016   Procedure:  TOTAL KNEE ARTHROPLASTY;  Surgeon: Marybelle Killings, MD;  Location: [facility];  Service: Orthopedics;  Laterality: Right;  . THYROIDECTOMY  2000    OB History    No data available       Home Medications    Prior to Admission medications   Medication Sig Start Date End Date Taking? Authorizing Provider  amLODipine (NORVASC) 10 MG tablet TAKE 1 TABLET BY MOUTH ONCE DAILY 09/28/11   Wellington Hampshire, MD  aspirin 81 MG EC tablet Take 81 mg by mouth daily. 10/21/17   [provider]  atorvastatin (LIPITOR) 40 MG tablet Take 1 tablet (40 mg total) by mouth  at bedtime. 12/21/17   Wellington Hampshire, MD  budesonide-formoterol (SYMBICORT) 160-4.5 MCG/ACT inhaler Inhale 2 puffs into the lungs daily. Rinse mouth after use. 12/22/17   Laverle Hobby, MD  busPIRone (BUSPAR) 7.5 MG tablet Take 7.5 mg by mouth 2 (two) times daily as needed.    [provider]  carvedilol (COREG) 6.25 MG tablet Take 1 tablet (6.25 mg total) by mouth 2 (two) times daily. 11/22/17   Rise Mu, PA-C  cloNIDine (CATAPRES) 0.2 MG tablet TK 1 T PO QD 11/13/17   [provider]  fluocinonide cream (LIDEX) 6.27 % Apply 1 application topically as needed (psoriasis).    [provider]  furosemide (LASIX) 40 MG tablet Take 40 mg by mouth daily as needed for fluid.     [provider]    naproxen sodium (ANAPROX) 220 MG tablet Take 440 mg by mouth 2 (two) times daily as needed.    [provider]  omeprazole (PRILOSEC) 40 MG capsule Take 40 mg by mouth daily as needed.    [provider]  traMADol (ULTRAM) 50 MG tablet Take 50 mg by mouth every 6 (six) hours as needed. for pain 09/21/17   [provider]  traZODone (DESYREL) 50 MG tablet Take 50 mg by mouth at bedtime.    [provider]    Family History Family History  Problem Relation Age of Onset  . Diabetes Mother     Social History Social History   Tobacco Use  . Smoking status: Former Smoker    Packs/day: 0.25    Years: 20.00    Pack years: 5.00    Types: Cigarettes  . Smokeless tobacco: Never Used  . Tobacco comment: quit 2012  Substance Use Topics  . Alcohol use: No    Alcohol/week: 1.8 oz    Types: 3 Glasses of wine per week  . Drug use: No     Allergies   Hydrocodone-acetaminophen; Hydrocodone-acetaminophen; Loracarbef; Penicillins; and Prednisone   Review of Systems Review of Systems  All other systems reviewed and are negative.    Physical Exam Updated Vital Signs BP (!) 191/92   Pulse (!) 57   Temp 98.6 F (37 C) (Oral)   Resp 15   Wt 127 kg (280 lb)   SpO2 99%   BMI 42.57 kg/m   Physical Exam  Constitutional: She is oriented to person, place, and time. She appears well-developed and well-nourished. No distress.  HENT:  Head: Normocephalic and atraumatic.  Eyes: EOM are normal.  Neck: Normal range of motion.  Cardiovascular: Normal rate, regular rhythm and normal heart sounds.  Pulmonary/Chest: Effort normal and breath sounds normal.  Abdominal: Soft. She exhibits no distension. There is no tenderness.  Musculoskeletal: Normal range of motion.  Neurological: She is alert and oriented to person, place, and time.  Skin: Skin is warm and dry.  Psychiatric: She has a normal mood and affect. Judgment normal.  Nursing note and vitals  reviewed.    ED Treatments / Results  Labs (all labs ordered are listed, but only abnormal results are displayed) Labs Reviewed  BASIC METABOLIC PANEL - Abnormal; Notable for the following components:      Result Value   Glucose, Bld 108 (*)    All other components within normal limits  CBC - Abnormal; Notable for the following components:   RBC 5.22 (*)    Hemoglobin 15.2 (*)    HCT 46.8 (*)    All other components within normal  limits  I-STAT TROPONIN, ED  I-STAT TROPONIN, ED    EKG  EKG Interpretation  Date/Time:  Monday January 10 2018 11:50:07 EDT Ventricular Rate:  65 PR Interval:  144 QRS Duration: 86 QT Interval:  432 QTC Calculation: 449 R Axis:   -4 Text Interpretation:  Normal sinus rhythm Left ventricular hypertrophy with repolarization abnormality Abnormal ECG No significant change was found Confirmed by Jola Schmidt [phone]) on 01/10/2018 7:01:21 PM       Radiology Dg Chest 2 View  Result Date: 01/10/2018 CLINICAL DATA:  Chest pain. EXAM: CHEST - 2 VIEW COMPARISON:  Radiographs of April 17, 2016. FINDINGS: The heart size and mediastinal contours are within normal limits. No pneumothorax or pleural effusion is noted. Stable calcified granuloma noted in right upper lobe. No acute pulmonary disease is noted. The visualized skeletal structures are unremarkable. IMPRESSION: No active cardiopulmonary disease. Electronically Signed   By: Marijo Conception, M.D.   On: 01/10/2018 12:51   Ct Angio Chest/abd/pel For Dissection W And/or Wo Contrast  Result Date: 01/10/2018 CLINICAL DATA:  62 year old female with chest pain radiating to arm and back. Concern for dissection. EXAM: CT ANGIOGRAPHY CHEST, ABDOMEN AND PELVIS TECHNIQUE: Multidetector CT imaging through the chest, abdomen and pelvis was performed using the standard protocol during bolus administration of intravenous contrast. Multiplanar reconstructed images and MIPs were obtained and reviewed to evaluate the vascular  anatomy. CONTRAST:  134mL ISOVUE-370 IOPAMIDOL (ISOVUE-370) INJECTION 76% COMPARISON:  Chest radiograph dated 01/10/2018 and CT dated 03/28/2015 FINDINGS: CTA CHEST FINDINGS Cardiovascular: There is no cardiomegaly or pericardial effusion. Partial calcification of the mitral annulus. The thoracic aorta is unremarkable. No aneurysmal dilatation or dissection. The origins of the great vessels of the aortic arch are patent. The central pulmonary arteries are grossly unremarkable for the degree of enhancement. Mediastinum/Nodes: There is no hilar or mediastinal adenopathy. Esophagus is grossly unremarkable. No mediastinal fluid collection. Lungs/Pleura: Stable 13 mm calcified right apical granuloma. A 3 mm nodule in the left lung base (series 8, image 123) stable compared study of 2016. A 3 mm calcified right upper lobe granuloma. There is a 4 mm nodule in the right lower lobe (series 8, image 97), new compared to the study of 2016. There is no focal consolidation, pleural effusion, or pneumothorax. The central airways are patent. Musculoskeletal: There is degenerative changes of the spine. No acute osseous pathology. Review of the MIP images confirms the above findings. CTA ABDOMEN AND PELVIS FINDINGS VASCULAR Aorta: Mild atherosclerotic calcification. No aneurysmal dilatation or dissection. Celiac: Patent without evidence of aneurysm, dissection, vasculitis or significant stenosis. SMA: Patent without evidence of aneurysm, dissection, vasculitis or significant stenosis. Renals: Both renal arteries are patent without evidence of aneurysm, dissection, vasculitis, fibromuscular dysplasia or significant stenosis. IMA: Patent without evidence of aneurysm, dissection, vasculitis or significant stenosis. Inflow: Patent without evidence of aneurysm, dissection, vasculitis or significant stenosis. Veins: No obvious venous abnormality within the limitations of this arterial phase study. Review of the MIP images confirms the  above findings. NON-VASCULAR No intra-abdominal free air or free fluid. Hepatobiliary: No focal liver abnormality is seen. No gallstones, gallbladder wall thickening, or biliary dilatation. Pancreas: Unremarkable. No pancreatic ductal dilatation or surrounding inflammatory changes. Spleen: Normal in size without focal abnormality. Adrenals/Urinary Tract: The adrenal glands are unremarkable. There is a 3 mm nonobstructing right renal interpolar stone. No hydronephrosis. The left kidney is unremarkable. The visualized ureters and urinary bladder are unremarkable. Stomach/Bowel: There is sigmoid diverticulosis without active inflammatory changes. No bowel  obstruction. Normal appendix. Lymphatic: No adenopathy. Reproductive: Hysterectomy.  No pelvic mass. Other: None Musculoskeletal: No acute or significant osseous findings. Review of the MIP images confirms the above findings. IMPRESSION: 1. No acute intrathoracic, abdominal, or pelvic pathology. No CT evidence of aortic dissection or aneurysm. 2. Small scattered pulmonary nodules as described. A 4 mm nodule in the right lower lobe is new compared to prior CT. No follow-up needed if patient is low-risk. Non-contrast chest CT can be considered in 12 months if patient is high-risk. This recommendation follows the consensus statement: Guidelines for Management of Incidental Pulmonary Nodules Detected on CT Images: From the Fleischner Society 2017; Radiology 2017; 284:228-243. 3. A 3 mm nonobstructing right renal stone.  No hydronephrosis. Electronically Signed   By: Anner Crete M.D.   On: 01/10/2018 21:02    Procedures Procedures (including critical care time)  Medications Ordered in ED Medications  iopamidol (ISOVUE-370) 76 % injection (100 mLs Intravenous Contrast Given 01/10/18 2027)  acetaminophen (TYLENOL) tablet 650 mg (650 mg Oral Given 01/10/18 2104)     Initial Impression / Assessment and Plan / ED Course  I have reviewed the triage vital signs  and the nursing notes.  Pertinent labs & imaging results that were available during my care of the patient were reviewed by me and considered in my medical decision making (see chart for details).     Given her severe chest discomfort on Saturday with radiation through to her back and significantly elevated blood pressure a CT Angie was performed of her chest to evaluate for possible aortic dissection.  Given the timeframe it is unlikely however her symptoms were significant enough.  EKG is without ischemic changes.  Initial troponin is negative.  9:49 PM Patient continues to be chest pain-free at this time.  Repeat troponin is negative.  CT Angie demonstrates no aortic abnormalities.  She will need outpatient cardiac follow-up.  She will likely either need stress test or repeat heart catheterization.  I do not think this needs to be completed now.  She is pain-free.  She has not had any significant discomfort in her chest over the past 48 hours.  Blood pressure seems better managed at this time.  She understands return to the ER for new or worsening symptoms.  Close cardiac follow-up   Final Clinical Impressions(s) / ED Diagnoses   Final diagnoses:  Precordial chest pain    ED Discharge Orders    None       Jola Schmidt, MD 01/10/18 2149

## 2018-01-10 NOTE — ED Triage Notes (Signed)
Pt presents to the ed for ongoing off and on chest pain x 3 days and was sent here by cardiology. Pain radiates into her left arm, shoulder and back, lasting a few minutes.  Pt also reports elevated blood pressure. No cp at this time.

## 2018-01-10 NOTE — ED Notes (Signed)
EDP at bedside providing update. [employer]

## 2018-01-10 NOTE — ED Notes (Signed)
Patient states that she has had repeated TIA since December.

## 2018-01-10 NOTE — ED Notes (Signed)
EDP made aware of unsuccessful IV attempts. Awaiting IV team.

## 2018-01-10 NOTE — Telephone Encounter (Signed)
S/w patient. Patient had chest pain most of the day on Saturday. She described it as something like a hot poker in her chest from the middle to the shoulder and back. She also felt heart burn and bloating like she had eaten a huge feel, though she had not done so. BP was 220/130's on Saturday. She also had shortness of breath.  Today, BP is 180/120 and she's taken her morning mediations. Describes her chest as "somethings not right." Feels uncomfortable. Still has pain in her left shoulder. Every so many minutes she feels like her heart is beating out of her chest into her throat.  Advised patient she should go directly to the Emergency room for evaluation. 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 She verbalized understanding and agreed she needed evaluation. She will proceed to nearest ER and call 911 if needed.

## 2018-01-11 ENCOUNTER — Other Ambulatory Visit: Payer: Self-pay | Admitting: Physician Assistant

## 2018-01-11 ENCOUNTER — Telehealth: Payer: Self-pay | Admitting: Cardiovascular Disease

## 2018-01-11 ENCOUNTER — Ambulatory Visit (INDEPENDENT_AMBULATORY_CARE_PROVIDER_SITE_OTHER): Payer: BLUE CROSS/BLUE SHIELD | Admitting: Physician Assistant

## 2018-01-11 ENCOUNTER — Encounter: Payer: Self-pay | Admitting: Physician Assistant

## 2018-01-11 VITALS — BP 140/90 | HR 61 | Ht 68.0 in | Wt 282.0 lb

## 2018-01-11 DIAGNOSIS — R002 Palpitations: Secondary | ICD-10-CM | POA: Diagnosis not present

## 2018-01-11 DIAGNOSIS — I25110 Atherosclerotic heart disease of native coronary artery with unstable angina pectoris: Secondary | ICD-10-CM | POA: Diagnosis not present

## 2018-01-11 DIAGNOSIS — I200 Unstable angina: Secondary | ICD-10-CM

## 2018-01-11 DIAGNOSIS — G459 Transient cerebral ischemic attack, unspecified: Secondary | ICD-10-CM

## 2018-01-11 DIAGNOSIS — Z01810 Encounter for preprocedural cardiovascular examination: Secondary | ICD-10-CM | POA: Diagnosis not present

## 2018-01-11 DIAGNOSIS — R911 Solitary pulmonary nodule: Secondary | ICD-10-CM

## 2018-01-11 DIAGNOSIS — R0602 Shortness of breath: Secondary | ICD-10-CM

## 2018-01-11 DIAGNOSIS — I10 Essential (primary) hypertension: Secondary | ICD-10-CM

## 2018-01-11 HISTORY — DX: Unstable angina: I20.0

## 2018-01-11 NOTE — Telephone Encounter (Signed)
Lmov for patient to call back to schedule ED fu appointment from 01/10/18 seen for CP Will try again at a later time

## 2018-01-11 NOTE — Patient Instructions (Addendum)
Medication Instructions:  Your physician recommends that you continue on your current medications as directed. Please refer to the Current Medication list given to you today.   Labwork: None  Testing/Procedures: Your physician has requested that you have a cardiac catheterization. Cardiac catheterization is used to diagnose and/or treat various heart conditions. Doctors may recommend this procedure for a number of different reasons. The most common reason is to evaluate chest pain. Chest pain can be a symptom of coronary artery disease (CAD), and cardiac catheterization can show whether plaque is narrowing or blocking your heart's arteries. This procedure is also used to evaluate the valves, as well as measure the blood flow and oxygen levels in different parts of your heart. For further information please visit [email or web address][email or web address]. Please follow instruction sheet, as given.  [facility] Cardiac Cath Instructions   You are scheduled for a RIGHT AND LEFT Cardiac Cath on:____3/18/19_________  Please arrive at _08:30 AM___am on the day of your procedure  Please expect a call from our [facility] [place] to pre-register you  Do not eat/drink anything after midnight  Someone will need to drive you home  It is recommended someone be with you for the first 24 hours after your procedure  Wear clothes that are easy to get on/off and wear slip on shoes if possible   Medications bring a current list of all medications with you  _XX_ You may take all of your medications the morning of your procedure with enough water to swallow safely  _XX_ Do not take these medications before your procedure:________FUROSEMIDE_____________________   Day of your procedure: Arrive at the [facility] [place] [facility] entrance.  Free valet service is available.  After entering the [facility] please check-in at the registration desk (1st desk on your right) to receive your armband. After receiving your armband someone will  escort you to the cardiac cath/special procedures waiting area.  The usual length of stay after your procedure is about 2 to 3 hours.  This can vary.  If you have any questions, please call our office at [phone], or you may call the cardiac cath lab at [facility] directly at [phone]    Follow-Up: Your physician recommends that you schedule a follow-up appointment in: [place].   If you need a refill on your cardiac medications before your next appointment, please call your pharmacy.    Coronary Angiogram With Stent Coronary angiogram with stent placement is a procedure to widen or open a narrow blood vessel of the heart (coronary artery). Arteries may become blocked by cholesterol buildup (plaques) in the lining of the wall. When a coronary artery becomes partially blocked, blood flow to that area decreases. This may lead to chest pain or a heart attack (myocardial infarction). A stent is a small piece of metal that looks like mesh or a spring. Stent placement may be done as treatment for a heart attack or right after a coronary angiogram in which a blocked artery is found. Let your health care provider know about:  Any allergies you have.  All medicines you are taking, including vitamins, herbs, eye drops, creams, and over-the-counter medicines.  Any problems you or family members have had with anesthetic medicines.  Any blood disorders you have.  Any surgeries you have had.  Any medical conditions you have.  Whether you are pregnant or may be pregnant. What are the risks? Generally, this is a safe procedure. However, problems may occur, including:  Damage to the heart  or its blood vessels.  A return of blockage.  Bleeding, infection, or bruising at the insertion site.  A collection of blood under the skin (hematoma) at the insertion site.  A blood clot in another part of the body.  Kidney injury.  Allergic reaction to the dye or  contrast that is used.  Bleeding into the abdomen (retroperitoneal bleeding).  What happens before the procedure? Staying hydrated Follow instructions from your health care provider about hydration, which may include:  Up to 2 hours before the procedure - you may continue to drink clear liquids, such as water, clear fruit juice, black coffee, and plain tea.  Eating and drinking restrictions Follow instructions from your health care provider about eating and drinking, which may include:  8 hours before the procedure - stop eating heavy meals or foods such as meat, fried foods, or fatty foods.  6 hours before the procedure - stop eating light meals or foods, such as toast or cereal.  2 hours before the procedure - stop drinking clear liquids.  Ask your health care provider about:  Changing or stopping your regular medicines. This is especially important if you are taking diabetes medicines or blood thinners.  Taking medicines such as ibuprofen. These medicines can thin your blood. Do not take these medicines before your procedure if your health care provider instructs you not to. Generally, aspirin is recommended before a procedure of passing a small, thin tube (catheter) through a blood vessel and into the heart (cardiac catheterization).  What happens during the procedure?  An IV tube will be inserted into one of your veins.  You will be given one or more of the following: ? A medicine to help you relax (sedative). ? A medicine to numb the area where the catheter will be inserted into an artery (local anesthetic).  To reduce your risk of infection: ? Your health care team will wash or sanitize their hands. ? Your skin will be washed with soap. ? Hair may be removed from the area where the catheter will be inserted.  Using a guide wire, the catheter will be inserted into an artery. The location may be in your groin, in your wrist, or in the fold of your arm (near your  elbow).  A type of X-ray (fluoroscopy) will be used to help guide the catheter to the opening of the arteries in the heart.  A dye will be injected into the catheter, and X-rays will be taken. The dye will help to show where any narrowing or blockages are located in the arteries.  A tiny wire will be guided to the blocked spot, and a balloon will be inflated to make the artery wider.  The stent will be expanded and will crush the plaques into the wall of the vessel. The stent will hold the area open and improve the blood flow. Most stents have a drug coating to reduce the risk of the stent narrowing over time.  The artery may be made wider using a drill, laser, or other tools to remove plaques.  When the blood flow is better, the catheter will be removed. The lining of the artery will grow over the stent, which stays where it was placed. This procedure may vary among health care providers and hospitals. What happens after the procedure?  If the procedure is done through the leg, you will be kept in bed lying flat for about 6 hours. You will be instructed to not bend and not cross your  legs.  The insertion site will be checked frequently.  The pulse in your foot or wrist will be checked frequently.  You may have additional blood tests, X-rays, and a test that records the electrical activity of your heart (electrocardiogram, or ECG). This information is not intended to replace advice given to you by your health care provider. Make sure you discuss any questions you have with your health care provider. Document Released: 04/25/2003 Document Revised: 06/18/2016 Document Reviewed: 05/24/2016 Elsevier Interactive Patient Education  [employer].

## 2018-01-11 NOTE — Progress Notes (Signed)
Cardiology Office Note Date:  01/11/2018  Patient ID:  Kathleen [name], Kathleen [name] [birth year], MRN [id number] PCP:  Leonard Downing, MD  Cardiologist:  Dr. Fletcher Anon, MD    Chief Complaint: ED follow up  History of Present Illness: Kathleen [name] is a 62 y.o. female with history of CAD with moderate mid LAD stenosis detected on cardiac cath in 12/2010 with FFR of 0.83, hypertension with hypertensive heart disease, HLD with prior intolerance to simvastatin and pravastatin tolerating Lipitor, prior tobacco abuse quitting in 2013 with possible COPD, hypothyroidism status post thyroidectomy, GERD and morbid obesity with possible OSA who presents for ED follow up of chest pain.  She was seen by Dr. Fletcher Anon on 04/15/2015 for pre-operative evaluation for knee surgery. Prior to that visit she had not been seen since 2013. Echo from 08/2012 showed an EF of 65-70% with Gr1DD. She was seen by [facility] on 04/28/2016 for pre-operative assessment of her knee.   She admitted to [facility] 10/20/17-10/25/17 for slurred speech, right-sided facial droop, right sided upper extremity weakness, and chest tightness.  Initial blood pressure of 206/88.  EKG showed sinus bradycardia, 57 bpm, T wave inversion in leads I, II, V2, V4 through V6.  BNP 334.  CT of the head showed no acute pathology.  MRI of the pain showed no acute process.  There was no evidence of arrhythmia/A. fib on telemetry.  Carotid Doppler showed no hemodynamically significant internal carotid artery stenosis.  Echo showed EF of 55-60%, moderate concentric LVH, mild aortic valve sclerosis without evidence of aortic regurgitation, mild mitral annular calcification with mild mitral regurgitation, mild tricuspid regurgitation, moderately dilated left atrium.  Sinus rhythm.  Overall technically difficult study.  Blood pressure improved during the admission and was advised to re-establish with cardiology upon discharge.  She was seen in hospital follow  up on 11/22/17 and continued to note intermittent, brief episodes of slurred speech and chest pain. BP at home was ranging between 546-270 systolic. Given her symptoms, abnormal EKG and body habitus, it was decided she should undergo LHC for definitive evaluation as noninvasive studies may be less sensitive. She was also placed on a 30-day event monitor, hypertensives were titrated and she was referred to pulmonology for possible sleep apnea. She called back on 2/12 indicating she wanted to defer her LHC given family stressors at home. She called on 2/22 indicating she had "fluid build up in her throat" that improved with Lasix 80 mg daily. She was advised to take Lasix 40 mg daily and follow up with her PCP.   She was seen in the ED on 3/11 with complaints of chest pain that radiated to her back and left shoulder that developed on 3/10. BP was noted to be in the 350K to 938H systolic with a beating sensation in her throat. BP in the ED was 191/92. Troponin negative x 2, WBC 9.1, HGB 15.2, K+ 4.1, glucose 108, SCr 0.77, EKG showed NSR, 65 bpm, LVH with early repolarization, TWI I, II, aVL, V2, V5, V6. CXR without active cardiopulmonary disease, CTA chest/abdomen/pelvis was negative for aortic dissection or aneurysm. Incidental scattered pulmonary nodules were noted, as well as a 3 mm nonobstructing right renal stone without hydronephrosis. She was discharged to outpatient follow up.  She comes in doing reasonably well. She has not had any further episodes of chest pain similar to her pain on 3/10. She continues to note intermittent "hear pounding" sensation that feels like her heart is in her throat.  This is not described as heart fluttering or racing, just pounding. She has had the MD she works with listen to her heart when she is symptomatic with no acute finding on auscultation per patient. She has completed the event monitor and is planning to mail this in today. Currently, asymptomatic.    Past Medical  History:  Diagnosis Date  . Arthritis   . Cancer ([place])   . Chronic back pain   . Complication of anesthesia    Woke up during  Hysterectomy  . Coronary artery disease 2012   Mild to Moderate  . Family history of adverse reaction to anesthesia   . GERD (gastroesophageal reflux disease)    omeprazole prn  . Heart murmur    "nothing to be concerned about  . History of blood transfusion   . Hx of thyroid cancer   . Hyperlipidemia   . Hypertension   . Hypothyroidism   . Obesity   . PONV (postoperative nausea and vomiting)    with thyroid surgery  . Shortness of breath dyspnea    with exertion  . Sleep apnea, obstructive    possible  . Tobacco abuse     Past Surgical History:  Procedure Laterality Date  . ABDOMINAL HYSTERECTOMY  1999  . CARDIAC CATHETERIZATION  Feb 2012   LAD: 60% mid (FFR ratio 0.83), LCX: 30-40%, RCA 20%.   Marland Kitchen KNEE ARTHROPLASTY Right 05/01/2016   Procedure:  TOTAL KNEE ARTHROPLASTY;  Surgeon: Marybelle Killings, MD;  Location: [facility];  Service: Orthopedics;  Laterality: Right;  . THYROIDECTOMY  2000    Current Meds  Medication Sig  . amLODipine (NORVASC) 10 MG tablet TAKE 1 TABLET BY MOUTH ONCE DAILY  . aspirin 81 MG EC tablet Take 81 mg by mouth daily.  Marland Kitchen atorvastatin (LIPITOR) 40 MG tablet Take 1 tablet (40 mg total) by mouth at bedtime.  . budesonide-formoterol (SYMBICORT) 160-4.5 MCG/ACT inhaler Inhale 2 puffs into the lungs daily. Rinse mouth after use.  . busPIRone (BUSPAR) 7.5 MG tablet Take 7.5 mg by mouth 2 (two) times daily as needed.  . carvedilol (COREG) 6.25 MG tablet Take 1 tablet (6.25 mg total) by mouth 2 (two) times daily.  . cloNIDine (CATAPRES) 0.2 MG tablet TK 1 T PO QD  . fluocinonide cream (LIDEX) 2.37 % Apply 1 application topically as needed (psoriasis).  . furosemide (LASIX) 40 MG tablet Take 40 mg by mouth daily as needed for fluid.   . naproxen sodium (ANAPROX) 220 MG tablet Take 440 mg by mouth 2 (two) times daily as needed.  Marland Kitchen  omeprazole (PRILOSEC) 40 MG capsule Take 40 mg by mouth daily as needed.  . traMADol (ULTRAM) 50 MG tablet Take 50 mg by mouth every 6 (six) hours as needed. for pain  . traZODone (DESYREL) 50 MG tablet Take 50 mg by mouth at bedtime.    Allergies:   Hydrocodone-acetaminophen; Hydrocodone-acetaminophen; Loracarbef; Penicillins; and Prednisone   Social History:  The patient  reports that she has quit smoking. Her smoking use included cigarettes. She has a 5.00 pack-year smoking history. she has never used smokeless tobacco. She reports that she does not drink alcohol or use drugs.   Family History:  The patient's family history includes Diabetes in her mother.  ROS:   Review of Systems  Constitutional: Positive for malaise/fatigue. Negative for chills, diaphoresis, fever and weight loss.  HENT: Negative for congestion.   Eyes: Negative for discharge and redness.  Respiratory: Positive for shortness of breath.  Negative for cough, hemoptysis, sputum production and wheezing.   Cardiovascular: Positive for chest pain. Negative for palpitations, orthopnea, claudication, leg swelling and PND.  Gastrointestinal: Negative for abdominal pain, blood in stool, heartburn, melena, nausea and vomiting.  Genitourinary: Negative for hematuria.  Musculoskeletal: Negative for falls and myalgias.  Skin: Negative for rash.  Neurological: Positive for weakness. Negative for dizziness, tingling, tremors, sensory change, speech change, focal weakness and loss of consciousness.  Endo/Heme/Allergies: Does not bruise/bleed easily.  Psychiatric/Behavioral: Negative for substance abuse. The patient is nervous/anxious and has insomnia.   All other systems reviewed and are negative.    PHYSICAL EXAM:  VS:  BP 140/90 (BP Location: Left Wrist, Patient Position: Sitting, Cuff Size: Normal)   Pulse 61   Ht 5\' 8"  (1.727 m)   Wt 282 lb (127.9 kg)   BMI 42.88 kg/m  BMI: Body mass index is 42.88 kg/m.  Physical Exam    Constitutional: She is oriented to person, place, and time. She appears well-developed and well-nourished.  HENT:  Head: Normocephalic and atraumatic.  Eyes: Right eye exhibits no discharge. Left eye exhibits no discharge.  Neck: Normal range of motion. No JVD present.  Cardiovascular: Normal rate, regular rhythm, S1 normal and S2 normal. Exam reveals no distant heart sounds, no friction rub, no midsystolic click and no opening snap.  Murmur heard. High-pitched blowing holosystolic murmur is present with a grade of 1/6 at the apex. Pulses:      Dorsalis pedis pulses are 2+ on the right side, and 2+ on the left side.  Pulmonary/Chest: Effort normal and breath sounds normal. No respiratory distress. She has no decreased breath sounds. She has no wheezes. She has no rales. She exhibits no tenderness.  Abdominal: Soft. She exhibits no distension. There is no tenderness.  Musculoskeletal: She exhibits no edema.  Neurological: She is alert and oriented to person, place, and time.  Skin: Skin is warm and dry. No cyanosis. Nails show no clubbing.  Psychiatric: She has a normal mood and affect. Her speech is normal and behavior is normal. Judgment and thought content normal.     EKG:  Was not ordered by me today at the patient's request.   Recent Labs: 01/10/2018: BUN 9; Creatinine, Ser 0.77; Hemoglobin 15.2; Platelets 253; Potassium 4.1; Sodium 138  No results found for requested labs within last 8760 hours.   Estimated Creatinine Clearance: 104.3 mL/min (by C-G formula based on SCr of 0.77 mg/dL).   Wt Readings from Last 3 Encounters:  01/11/18 282 lb (127.9 kg)  01/10/18 280 lb (127 kg)  12/22/17 280 lb (127 kg)     Other studies reviewed: Additional studies/records reviewed today include: summarized above  ASSESSMENT AND PLAN:  1. CAD of native coronary arteries with unstable angina: Currently without symptoms of chest pain. Previously planned for [facility], though this was deferred on  the patient's behalf due to family reasons. Given her persistent symptoms with known CAD as above, abnormal EKG and body habitus that makes noninvasive evaluation less than ideal we have agreed to schedule her for a LHC. Continue ASA, Lipitor, Coreg. Aggressive risk factor modification. Risks and benefits of cardiac catheterization have been discussed with the patient including risks of bleeding, bruising, infection, kidney damage, stroke, heart attack, and death. The patient understands these risks and is willing to proceed with the procedure. All questions have been answered and concerns listened to.   2. Palpitations: Await event monitor.   3. Hypertension: Likely exacerbated by untreated OSA/OHS. She  is scheduled for sleep study later this month. If, with adequate treatment of her likely sleep apnea, her BP continues to run high, she should undergo further evaluation for secondary hypertension. Continue current medications.   4. History of TIA: Per PCP. ASA.   5. Pulmonary nodule: Discussed follow up plan with patient. She will need to follow up with PCP for scheduling of repeat imaging per radiology recommendation.   6. Hyperlipidemia: Tolerating Lipitor.  Plan for follow-up lipid and liver function.  7. Morbid obesity with possible OSA/OHS: Has sleep study scheduled for later this month.  Follow-up with pulmonology.  Disposition: F/u with myself or Dr. Fletcher Anon in 2 weeks.  Current medicines are reviewed at length with the patient today.  The patient did not have any concerns regarding medicines.  Signed, Christell Faith, PA-C 01/11/2018 2:03 PM     [facility] [address] [place] [address] [place], [place] [address] [phone]

## 2018-01-11 NOTE — H&P (View-Only) (Signed)
Cath orders placed 

## 2018-01-11 NOTE — Progress Notes (Signed)
Cath orders placed 

## 2018-01-13 ENCOUNTER — Telehealth: Payer: Self-pay | Admitting: Cardiovascular Disease

## 2018-01-13 NOTE — Telephone Encounter (Signed)
Pt would like to know if she can drive the day after her heart cath. Please call and advise.

## 2018-01-13 NOTE — Telephone Encounter (Signed)
S/w patient. Patient advised she will not be able to drive for 48 hours after her procedure. She verbalized understanding. She states she is supposed to pick up her supplies for sleep apnea on 01/18/18 and was hoping to do that as she is discharged from the hospital as she lives over an hour away.  Advised I would route to Pulmonology triage to arrange the sleep study/supplies.

## 2018-01-14 NOTE — Telephone Encounter (Signed)
Spoke with patient and she is having procedure on 01/17/18 and will be admitted to be D/C on 3/19 the day she is due to p/u the HST device.  Advised patient that I would be able to provide HST device earlier that day than scheduled pick up time for pt's convenience.  Nothing else needed. Rhonda J Cobb

## 2018-01-17 ENCOUNTER — Encounter
Admission: RE | Disposition: A | Discharge status: Home / Self Care | Payer: Self-pay | Source: Ambulatory Visit | Attending: Cardiovascular Disease

## 2018-01-17 ENCOUNTER — Ambulatory Visit
Admission: RE | Admit: 2018-01-17 | Discharge: 2018-01-17 | Disposition: A | Discharge status: Home / Self Care | Payer: BLUE CROSS/BLUE SHIELD | Source: Ambulatory Visit | Attending: Cardiovascular Disease | Admitting: Cardiovascular Disease

## 2018-01-17 ENCOUNTER — Encounter: Payer: Self-pay | Admitting: *Deleted

## 2018-01-17 DIAGNOSIS — E039 Hypothyroidism, unspecified: Secondary | ICD-10-CM | POA: Insufficient documentation

## 2018-01-17 DIAGNOSIS — I2582 Chronic total occlusion of coronary artery: Secondary | ICD-10-CM | POA: Diagnosis not present

## 2018-01-17 DIAGNOSIS — I2510 Atherosclerotic heart disease of native coronary artery without angina pectoris: Secondary | ICD-10-CM | POA: Diagnosis not present

## 2018-01-17 DIAGNOSIS — M549 Dorsalgia, unspecified: Secondary | ICD-10-CM | POA: Diagnosis not present

## 2018-01-17 DIAGNOSIS — I10 Essential (primary) hypertension: Secondary | ICD-10-CM | POA: Diagnosis not present

## 2018-01-17 DIAGNOSIS — Z880 Allergy status to penicillin: Secondary | ICD-10-CM | POA: Diagnosis not present

## 2018-01-17 DIAGNOSIS — R079 Chest pain, unspecified: Secondary | ICD-10-CM

## 2018-01-17 DIAGNOSIS — E785 Hyperlipidemia, unspecified: Secondary | ICD-10-CM | POA: Diagnosis not present

## 2018-01-17 DIAGNOSIS — Z6841 Body Mass Index (BMI) 40.0 and over, adult: Secondary | ICD-10-CM | POA: Diagnosis not present

## 2018-01-17 DIAGNOSIS — G8929 Other chronic pain: Secondary | ICD-10-CM | POA: Diagnosis not present

## 2018-01-17 DIAGNOSIS — K219 Gastro-esophageal reflux disease without esophagitis: Secondary | ICD-10-CM | POA: Diagnosis not present

## 2018-01-17 DIAGNOSIS — I200 Unstable angina: Secondary | ICD-10-CM

## 2018-01-17 DIAGNOSIS — Z8673 Personal history of transient ischemic attack (TIA), and cerebral infarction without residual deficits: Secondary | ICD-10-CM | POA: Diagnosis not present

## 2018-01-17 DIAGNOSIS — I272 Pulmonary hypertension, unspecified: Secondary | ICD-10-CM | POA: Diagnosis not present

## 2018-01-17 DIAGNOSIS — Z87891 Personal history of nicotine dependence: Secondary | ICD-10-CM | POA: Diagnosis not present

## 2018-01-17 HISTORY — PX: RIGHT/LEFT HEART CATH AND CORONARY ANGIOGRAPHY: CATH118266

## 2018-01-17 SURGERY — RIGHT/LEFT HEART CATH AND CORONARY/GRAFT ANGIOGRAPHY
Anesthesia: Moderate Sedation

## 2018-01-17 SURGERY — RIGHT/LEFT HEART CATH AND CORONARY ANGIOGRAPHY
Anesthesia: Moderate Sedation | Laterality: Bilateral

## 2018-01-17 MED ORDER — FENTANYL CITRATE (PF) 100 MCG/2ML IJ SOLN
INTRAMUSCULAR | Status: AC
  Filled 2018-01-17: qty 2

## 2018-01-17 MED ORDER — FENTANYL CITRATE (PF) 100 MCG/2ML IJ SOLN
INTRAMUSCULAR | Status: DC | PRN
  Administered 2018-01-17: 50 ug via INTRAVENOUS

## 2018-01-17 MED ORDER — VERAPAMIL HCL 2.5 MG/ML IV SOLN
INTRAVENOUS | Status: AC
  Filled 2018-01-17: qty 2

## 2018-01-17 MED ORDER — CARVEDILOL 12.5 MG PO TABS: 12.5000 mg | ORAL_TABLET | Freq: Two times a day (BID) | ORAL | 6 refills | Status: DC

## 2018-01-17 MED ORDER — MIDAZOLAM HCL 2 MG/2ML IJ SOLN
INTRAMUSCULAR | Status: AC
  Filled 2018-01-17: qty 2

## 2018-01-17 MED ORDER — FUROSEMIDE 40 MG PO TABS: 40.0000 mg | ORAL_TABLET | Freq: Every day | ORAL | Status: DC

## 2018-01-17 MED ORDER — SODIUM CHLORIDE 0.9% FLUSH: 3.0000 mL | INTRAVENOUS | Status: DC | PRN

## 2018-01-17 MED ORDER — SODIUM CHLORIDE 0.9 % IV SOLN: 250.0000 mL | INTRAVENOUS | Status: DC | PRN

## 2018-01-17 MED ORDER — VERAPAMIL HCL 2.5 MG/ML IV SOLN
INTRAVENOUS | Status: DC | PRN
  Administered 2018-01-17: 2.5 mg via INTRA_ARTERIAL

## 2018-01-17 MED ORDER — LABETALOL HCL 5 MG/ML IV SOLN
INTRAVENOUS | Status: DC | PRN
  Administered 2018-01-17 (×2): 10 mg via INTRAVENOUS

## 2018-01-17 MED ORDER — SODIUM CHLORIDE 0.9% FLUSH: 3.0000 mL | Freq: Two times a day (BID) | INTRAVENOUS | Status: DC

## 2018-01-17 MED ORDER — SODIUM CHLORIDE 0.9 % WEIGHT BASED INFUSION
3.0000 mL/kg/h | INTRAVENOUS | Status: AC
  Administered 2018-01-17: 3 mL/kg/h via INTRAVENOUS

## 2018-01-17 MED ORDER — SODIUM CHLORIDE 0.9% FLUSH
3.0000 mL | Freq: Two times a day (BID) | INTRAVENOUS | Status: DC
Start: 2018-01-17 — End: 2018-01-17

## 2018-01-17 MED ORDER — IOPAMIDOL (ISOVUE-300) INJECTION 61%
INTRAVENOUS | Status: DC | PRN
  Administered 2018-01-17: 95 mL via INTRA_ARTERIAL

## 2018-01-17 MED ORDER — HEPARIN SODIUM (PORCINE) 1000 UNIT/ML IJ SOLN
INTRAMUSCULAR | Status: DC | PRN
  Administered 2018-01-17: 6000 [IU] via INTRAVENOUS

## 2018-01-17 MED ORDER — HEPARIN SODIUM (PORCINE) 1000 UNIT/ML IJ SOLN
INTRAMUSCULAR | Status: AC
  Filled 2018-01-17: qty 1

## 2018-01-17 MED ORDER — MIDAZOLAM HCL 2 MG/2ML IJ SOLN
INTRAMUSCULAR | Status: DC | PRN
  Administered 2018-01-17: 1 mg via INTRAVENOUS

## 2018-01-17 MED ORDER — LABETALOL HCL 5 MG/ML IV SOLN
INTRAVENOUS | Status: AC
  Filled 2018-01-17: qty 4

## 2018-01-17 MED ORDER — SODIUM CHLORIDE 0.9 % WEIGHT BASED INFUSION: 1.0000 mL/kg/h | INTRAVENOUS | Status: DC

## 2018-01-17 SURGICAL SUPPLY — 12 items
CATH BALLN WEDGE 5F 110CM (CATHETERS) ×1 IMPLANT
CATH INFINITI 5 FR JL3.5 (CATHETERS) ×1 IMPLANT
CATH INFINITI 5FR ANG PIGTAIL (CATHETERS) ×1 IMPLANT
CATH INFINITI JR4 5F (CATHETERS) ×1 IMPLANT
DEVICE RAD TR BAND REGULAR (VASCULAR PRODUCTS) ×1 IMPLANT
GLIDESHEATH SLEND SS 6F .021 (SHEATH) ×1 IMPLANT
KIT MANI 3VAL PERCEP (MISCELLANEOUS) ×2 IMPLANT
KIT RIGHT HEART (MISCELLANEOUS) ×2 IMPLANT
PACK CARDIAC CATH (CUSTOM PROCEDURE TRAY) ×2 IMPLANT
SHEATH GLIDE SLENDER 4/5FR (SHEATH) ×1 IMPLANT
WIRE HITORQ VERSACORE ST 145CM (WIRE) ×1 IMPLANT
WIRE ROSEN-J .035X260CM (WIRE) ×1 IMPLANT

## 2018-01-17 NOTE — Interval H&P Note (Signed)
Cath Lab Visit (complete for each Cath Lab visit)  Clinical Evaluation Leading to the Procedure:   ACS: No.  Non-ACS:    Anginal Classification: CCS III  Anti-ischemic medical therapy: Maximal Therapy (2 or more classes of medications)  Non-Invasive Test Results: No non-invasive testing performed  Prior CABG: No previous CABG      History and Physical Interval Note:  01/17/2018 10:14 AM  Kathleen [name]  has presented today for surgery, with the diagnosis of LT and RT cath     Chest pain   Shortness of breath  The various methods of treatment have been discussed with the patient and family. After consideration of risks, benefits and other options for treatment, the patient has consented to  Procedure(s): RIGHT/LEFT HEART CATH AND CORONARY ANGIOGRAPHY (Bilateral) as a surgical intervention .  The patient's history has been reviewed, patient examined, no change in status, stable for surgery.  I have reviewed the patient's chart and labs.  Questions were answered to the patient's satisfaction.     Kathleen [name]

## 2018-01-17 NOTE — Discharge Instructions (Signed)
Low-Sodium Eating Plan Sodium, which is an element that makes up salt, helps you maintain a healthy balance of fluids in your body. Too much sodium can increase your blood pressure and cause fluid and waste to be held in your body. Your health care provider or dietitian may recommend following this plan if you have high blood pressure (hypertension), kidney disease, liver disease, or heart failure. Eating less sodium can help lower your blood pressure, reduce swelling, and protect your heart, liver, and kidneys. What are tips for following this plan? General guidelines  Most people on this plan should limit their sodium intake to 1,500-2,000 mg (milligrams) of sodium each day. Reading food labels  The Nutrition Facts label lists the amount of sodium in one serving of the food. If you eat more than one serving, you must multiply the listed amount of sodium by the number of servings.  Choose foods with less than 140 mg of sodium per serving.  Avoid foods with 300 mg of sodium or more per serving. Shopping  Look for lower-sodium products, often labeled as "low-sodium" or "no salt added."  Always check the sodium content even if foods are labeled as "unsalted" or "no salt added".  Buy fresh foods. ? Avoid canned foods and premade or frozen meals. ? Avoid canned, cured, or processed meats  Buy breads that have less than 80 mg of sodium per slice. Cooking  Eat more home-cooked food and less restaurant, buffet, and fast food.  Avoid adding salt when cooking. Use salt-free seasonings or herbs instead of table salt or sea salt. Check with your health care provider or pharmacist before using salt substitutes.  Cook with plant-based oils, such as canola, sunflower, or olive oil. Meal planning  When eating at a restaurant, ask that your food be prepared with less salt or no salt, if possible.  Avoid foods that contain MSG (monosodium glutamate). MSG is sometimes added to [place] food,  bouillon, and some canned foods. What foods are recommended? The items listed may not be a complete list. Talk with your dietitian about what dietary choices are best for you. Grains Low-sodium cereals, including oats, puffed wheat and rice, and shredded wheat. Low-sodium crackers. Unsalted rice. Unsalted pasta. Low-sodium bread. Whole-grain breads and whole-grain pasta. Vegetables Fresh or frozen vegetables. "No salt added" canned vegetables. "No salt added" tomato sauce and paste. Low-sodium or reduced-sodium tomato and vegetable juice. Fruits Fresh, frozen, or canned fruit. Fruit juice. Meats and other protein foods Fresh or frozen (no salt added) meat, poultry, seafood, and fish. Low-sodium canned tuna and salmon. Unsalted nuts. Dried peas, beans, and lentils without added salt. Unsalted canned beans. Eggs. Unsalted nut butters. Dairy Milk. Soy milk. Cheese that is naturally low in sodium, such as ricotta cheese, fresh mozzarella, or Swiss cheese Low-sodium or reduced-sodium cheese. Cream cheese. Yogurt. Fats and oils Unsalted butter. Unsalted margarine with no trans fat. Vegetable oils such as canola or olive oils. Seasonings and other foods Fresh and dried herbs and spices. Salt-free seasonings. Low-sodium mustard and ketchup. Sodium-free salad dressing. Sodium-free light mayonnaise. Fresh or refrigerated horseradish. Lemon juice. Vinegar. Homemade, reduced-sodium, or low-sodium soups. Unsalted popcorn and pretzels. Low-salt or salt-free chips. What foods are not recommended? The items listed may not be a complete list. Talk with your dietitian about what dietary choices are best for you. Grains Instant hot cereals. Bread stuffing, pancake, and biscuit mixes. Croutons. Seasoned rice or pasta mixes. Noodle soup cups. Boxed or frozen macaroni and cheese. Regular salted crackers.  Self-rising flour. Vegetables Sauerkraut, pickled vegetables, and relishes. Olives. [place] fries. Onion rings.  Regular canned vegetables (not low-sodium or reduced-sodium). Regular canned tomato sauce and paste (not low-sodium or reduced-sodium). Regular tomato and vegetable juice (not low-sodium or reduced-sodium). Frozen vegetables in sauces. Meats and other protein foods Meat or fish that is salted, canned, smoked, spiced, or pickled. Bacon, ham, sausage, hotdogs, corned beef, chipped beef, packaged lunch meats, salt pork, jerky, pickled herring, anchovies, regular canned tuna, sardines, salted nuts. Dairy Processed cheese and cheese spreads. Cheese curds. Blue cheese. Feta cheese. String cheese. Regular cottage cheese. Buttermilk. Canned milk. Fats and oils Salted butter. Regular margarine. Ghee. Bacon fat. Seasonings and other foods Onion salt, garlic salt, seasoned salt, table salt, and sea salt. Canned and packaged gravies. Worcestershire sauce. Tartar sauce. Barbecue sauce. Teriyaki sauce. Soy sauce, including reduced-sodium. Steak sauce. Fish sauce. Oyster sauce. Cocktail sauce. Horseradish that you find on the shelf. Regular ketchup and mustard. Meat flavorings and tenderizers. Bouillon cubes. Hot sauce and Tabasco sauce. Premade or packaged marinades. Premade or packaged taco seasonings. Relishes. Regular salad dressings. Salsa. Potato and tortilla chips. Corn chips and puffs. Salted popcorn and pretzels. Canned or dried soups. Pizza. Frozen entrees and pot pies. Summary  Eating less sodium can help lower your blood pressure, reduce swelling, and protect your heart, liver, and kidneys.  Most people on this plan should limit their sodium intake to 1,500-2,000 mg (milligrams) of sodium each day.  Canned, boxed, and frozen foods are high in sodium. Restaurant foods, fast foods, and pizza are also very high in sodium. You also get sodium by adding salt to food.  Try to cook at home, eat more fresh fruits and vegetables, and eat less fast food, canned, processed, or prepared foods. This information is  not intended to replace advice given to you by your health care provider. Make sure you discuss any questions you have with your health care provider. Document Released: 04/10/2002 Document Revised: 10/12/2016 Document Reviewed: 10/12/2016 Elsevier Interactive Patient Education  2018 [employer].   Reduce Clonidine to 1/2 tablet daily. Take Furosemide daily not as needed. Reduce salt intake   Angiogram An angiogram, also called angiography, is a procedure used to look at the blood vessels. In this procedure, dye is injected through a long, thin tube (catheter) into an artery. X-rays are then taken. The X-rays will show if there is a blockage or problem in a blood vessel. Tell a health care provider about:  Any allergies you have, including allergies to shellfish or contrast dye.  All medicines you are taking, including vitamins, herbs, eye drops, creams, and over-the-counter medicines.  Any problems you or family members have had with anesthetic medicines.  Any blood disorders you have.  Any surgeries you have had.  Any previous kidney problems or failure you have had.  Any medical conditions you have.  Possibility of pregnancy, if this applies. What are the risks? Angiogram, Care After This sheet gives you information about how to care for yourself after your procedure. Your health care provider may also give you more specific instructions. If you have problems or questions, contact your health care provider. What can I expect after the procedure? After the procedure, it is common to have bruising and tenderness at the catheter insertion area. Follow these instructions at home: Insertion site care  Follow instructions from your health care provider about how to take care of your insertion site. Make sure you: ? Wash your hands with soap and water  before you change your bandage (dressing). If soap and water are not available, use hand sanitizer. ? Change your dressing as told  by your health care provider. ? Leave stitches (sutures), skin glue, or adhesive strips in place. These skin closures may need to stay in place for 2 weeks or longer. If adhesive strip edges start to loosen and curl up, you may trim the loose edges. Do not remove adhesive strips completely unless your health care provider tells you to do that.  Do not take baths, swim, or use a hot tub until your health care provider approves.  You may shower 24-48 hours after the procedure or as told by your health care provider. ? Gently wash the site with plain soap and water. ? Pat the area dry with a clean towel. ? Do not rub the site. This may cause bleeding.  Do not apply powder or lotion to the site. Keep the site clean and dry.  Check your insertion site every day for signs of infection. Check for: ? Redness, swelling, or pain. ? Fluid or blood. ? Warmth. ? Pus or a bad smell. Activity  Rest as told by your health care provider, usually for 1-2 days.  Do not lift anything that is heavier than 10 lbs. (4.5 kg) or as told by your health care provider.  Do not drive for 24 hours if you were given a medicine to help you relax (sedative).  Do not drive or use heavy machinery while taking prescription pain medicine. General instructions  Return to your normal activities as told by your health care provider, usually in about a week. Ask your health care provider what activities are safe for you.  If the catheter site starts bleeding, lie flat and put pressure on the site. If the bleeding does not stop, get help right away. This is a medical emergency.  Drink enough fluid to keep your urine clear or pale yellow. This helps flush the contrast dye from your body.  Take over-the-counter and prescription medicines only as told by your health care provider.  Keep all follow-up visits as told by your health care provider. This is important. Contact a health care provider if:  You have a fever or  chills.  You have redness, swelling, or pain around your insertion site.  You have fluid or blood coming from your insertion site.  The insertion site feels warm to the touch.  You have pus or a bad smell coming from your insertion site.  You have bruising around the insertion site.  You notice blood collecting in the tissue around the catheter site (hematoma). The hematoma may be painful to the touch. Get help right away if:  You have severe pain at the catheter insertion area.  The catheter insertion area swells very fast.  The catheter insertion area is bleeding, and the bleeding does not stop when you hold steady pressure on the area.  The area near or just beyond the catheter insertion site becomes pale, cool, tingly, or numb. These symptoms may represent a serious problem that is an emergency. Do not wait to see if the symptoms will go away. Get medical help right away. Call your local emergency services (911 in the [place] Do not drive yourself to the hospital. Summary  After the procedure, it is common to have bruising and tenderness at the catheter insertion area.  After the procedure, it is important to rest and drink plenty of fluids.  Do not take baths, swim,  or use a hot tub until your health care provider says it is okay to do so. You may shower 24-48 hours after the procedure or as told by your health care provider.  If the catheter site starts bleeding, lie flat and put pressure on the site. If the bleeding does not stop, get help right away. This is a medical emergency. This information is not intended to replace advice given to you by your health care provider. Make sure you discuss any questions you have with your health care provider. Document Released: 05/07/2005 Document Revised: 09/23/2016 Document Reviewed: 09/23/2016 Elsevier Interactive Patient Education  2018 [employer].  Chartered certified accountant Patient Education  [employer].

## 2018-01-18 ENCOUNTER — Encounter: Payer: Self-pay | Admitting: Internal Medicine

## 2018-01-18 DIAGNOSIS — J449 Chronic obstructive pulmonary disease, unspecified: Secondary | ICD-10-CM

## 2018-01-18 DIAGNOSIS — G4719 Other hypersomnia: Secondary | ICD-10-CM

## 2018-01-18 DIAGNOSIS — G4733 Obstructive sleep apnea (adult) (pediatric): Secondary | ICD-10-CM

## 2018-01-18 DIAGNOSIS — J4489 Other specified chronic obstructive pulmonary disease: Secondary | ICD-10-CM

## 2018-01-19 ENCOUNTER — Telehealth: Payer: Self-pay | Admitting: Cardiovascular Disease

## 2018-01-19 MED ORDER — CARVEDILOL 12.5 MG PO TABS: 25.0000 mg | ORAL_TABLET | Freq: Two times a day (BID) | ORAL | 6 refills | Status: DC

## 2018-01-19 NOTE — Telephone Encounter (Signed)
Pt is calling with her BP reading from just now 234/135. Denies any chest pain.

## 2018-01-19 NOTE — Telephone Encounter (Signed)
Went to lobby and spoke with patient.  She had taken her morning medications around 0630 am and taken her BP around 0730 am.  Denies shortness of breath, chest pain, or dizziness. Reports have a headache. Advised patient to go home and retake blood pressure and call me with the reading. Encouraged patient to sit and relax for about 5-10 before taking. She verbalized understanding.

## 2018-01-19 NOTE — Telephone Encounter (Addendum)
Spoke with patient. She is calling back after taking her BP at home. It was 234/135, 81. She has a mild headache. She feels like she may be a little more short of breath. Denies chest pain. We discussed her medications and she stated Dr Fletcher Anon had decreased clonidine to 0.1 mg daily (changed on med list) and the rest of her list is correct. Advised patient to take clonidine 0.2 mg now and if BP has not decreased then she may need to go to the emergency room. Patient verbalized understanding.  Spoke with Dr Rockey Situ. He agreed with extra 0.2 mg of clonidine. He suggested patient waiting an hour or two to see if BP comes down after clonidine and morning medications. If blood pressure remains high, then patient may want to take her evening medications earlier or take double up on her metoprolol in the evening if heart rate stable.

## 2018-01-19 NOTE — Telephone Encounter (Signed)
Patient came by office to drop off her home sleep study test - in lobby States she took her blood pressure this morning and it was 230/129 Patient would like to speak with nurse and have her blood pressure retaken  Please discuss

## 2018-01-19 NOTE — Telephone Encounter (Signed)
Called patient again.  Patient verbalized understanding of Dr Donivan Scull recommendations.  She re-took BP while on the phone and it was down to 149/100, HR 76.  States this is what always happens and in a few hours it sometimes goes back up. Patient said she was going to take it easy for the next few hours. Advised I will make Dr Fletcher Anon aware and call her with further recommendations.

## 2018-01-19 NOTE — Telephone Encounter (Signed)
Patient verbalized understanding to start carvedilol 25 mg by mouth twice daily tomorrow and to continue other medications. She verbalized understanding to just take clonidine 0.1 mg twice daily. She will take two tablets of her current prescription of carvedilol twice a day. She will call when she needs a refill if necessary before f/u appointment.

## 2018-01-19 NOTE — Telephone Encounter (Signed)
Increase carvedilol to 25 mg twice daily starting tomorrow.  Continue other medications.  It is probably best to take clonidine 0.1 mg twice daily so that we do not get rebound hypertension.

## 2018-01-25 ENCOUNTER — Telehealth: Payer: Self-pay | Admitting: Internal Medicine

## 2018-01-25 NOTE — Telephone Encounter (Signed)
Pt is requesting PFT and sleep study results.   DR please advise. Thanks

## 2018-01-25 NOTE — Telephone Encounter (Signed)
Pt would like results from her sleep study and PFT

## 2018-01-26 DIAGNOSIS — G4733 Obstructive sleep apnea (adult) (pediatric): Secondary | ICD-10-CM | POA: Diagnosis not present

## 2018-01-26 NOTE — Telephone Encounter (Signed)
Pt is aware of below message and voiced her understanding. Nothing further is needed. 

## 2018-01-26 NOTE — Telephone Encounter (Signed)
Sleep study results are still pending. PFT showed did not show any evidence of lung disease such as COPD or emphysema.

## 2018-01-27 ENCOUNTER — Telehealth: Payer: Self-pay | Admitting: *Deleted

## 2018-01-27 DIAGNOSIS — G4733 Obstructive sleep apnea (adult) (pediatric): Secondary | ICD-10-CM

## 2018-01-27 NOTE — Telephone Encounter (Signed)
Severe Obstructive Sleep Apnea Recommend auto-CPAP with pressure range of 5-20cmH20. Orders placed  Patient aware

## 2018-02-04 ENCOUNTER — Encounter: Payer: Self-pay | Admitting: Cardiovascular Disease

## 2018-02-04 ENCOUNTER — Ambulatory Visit: Payer: BLUE CROSS/BLUE SHIELD | Admitting: Cardiovascular Disease

## 2018-02-04 VITALS — BP 150/100 | HR 62 | Ht 68.0 in | Wt 281.0 lb

## 2018-02-04 DIAGNOSIS — I5032 Chronic diastolic (congestive) heart failure: Secondary | ICD-10-CM

## 2018-02-04 DIAGNOSIS — Z79899 Other long term (current) drug therapy: Secondary | ICD-10-CM

## 2018-02-04 DIAGNOSIS — E782 Mixed hyperlipidemia: Secondary | ICD-10-CM

## 2018-02-04 DIAGNOSIS — I10 Essential (primary) hypertension: Secondary | ICD-10-CM | POA: Diagnosis not present

## 2018-02-04 DIAGNOSIS — I2510 Atherosclerotic heart disease of native coronary artery without angina pectoris: Secondary | ICD-10-CM | POA: Diagnosis not present

## 2018-02-04 MED ORDER — SPIRONOLACTONE 25 MG PO TABS: 25.0000 mg | ORAL_TABLET | Freq: Every day | ORAL | 3 refills | Status: DC

## 2018-02-04 NOTE — Telephone Encounter (Signed)
Pt calling stating the place where we sent CPAP machine in [place]   She has questions about this   Please call back

## 2018-02-04 NOTE — Telephone Encounter (Signed)
Called [facility] in [place] and they have received order for Auto CPAP and are working on order. [facility] stated that this could take up to five business days.  Returned patient's call and relayed this to her and advised patient that if she hasn't heard anything from [facility] by next week to call me back. Rhonda J Cobb

## 2018-02-04 NOTE — Progress Notes (Signed)
Cardiology Office Note   Date:  02/04/2018   ID:  Kathleen [name], Kathleen [name] [birth year], MRN [id number]  PCP:  Leonard Downing, MD  Cardiologist:   [name], MD   Chief Complaint  Patient presents with  . Other    2 week follow up from Cath. Patient C/o SOB, swelling, and choking. Meds reviewed verbally with patient.       History of Present Illness: Kathleen [name] is a 62 y.o. female who presents for a follow-up visit regarding coronary artery disease, chronic diastolic heart failure and refractory hypertension.  Other medical problems include hyperlipidemia, previous tobacco use, possible COPD, hypothyroidism, GERD, morbid obesity and sleep apnea. She was seen recently for increased shortness of breath and chest pain in the setting of uncontrolled hypertension.  I proceeded with a right and left cardiac catheterization last month which showed severe one-vessel coronary artery disease with chronically occluded mid left circumflex with very faint collaterals from the LAD.  Ejection fraction was normal.  Right heart catheterization showed moderately elevated filling pressures, moderate pulmonary hypertension and mildly reduced cardiac output.  Systemic blood pressure was severely elevated during cardiac catheterization.I instructed her to use furosemide daily instead of as needed and gradually increase the dose of carvedilol.  She reports that her symptoms have been the same.  She continues to be under significant stress.  I wean her off clonidine and she is currently using this only as needed.  She was diagnosed with sleep apnea but she has not received CPAP.    Past Medical History:  Diagnosis Date  . Arthritis   . Cancer ([place])   . Chronic back pain   . Complication of anesthesia    Woke up during  Hysterectomy  . Coronary artery disease 2012   Mild to Moderate  . Family history of adverse reaction to anesthesia   . GERD (gastroesophageal reflux disease)    omeprazole prn   . Heart murmur    "nothing to be concerned about  . History of blood transfusion   . Hx of thyroid cancer   . Hyperlipidemia   . Hypertension   . Hypothyroidism   . Obesity   . PONV (postoperative nausea and vomiting)    with thyroid surgery  . Shortness of breath dyspnea    with exertion  . Sleep apnea, obstructive    possible  . Tobacco abuse     Past Surgical History:  Procedure Laterality Date  . ABDOMINAL HYSTERECTOMY  1999  . CARDIAC CATHETERIZATION  Feb 2012   LAD: 60% mid (FFR ratio 0.83), LCX: 30-40%, RCA 20%.   Marland Kitchen KNEE ARTHROPLASTY Right 05/01/2016   Procedure:  TOTAL KNEE ARTHROPLASTY;  Surgeon: Marybelle Killings, MD;  Location: [facility];  Service: Orthopedics;  Laterality: Right;  . RIGHT/LEFT HEART CATH AND CORONARY ANGIOGRAPHY Bilateral 01/17/2018   Procedure: RIGHT/LEFT HEART CATH AND CORONARY ANGIOGRAPHY;  Surgeon: Wellington Hampshire, MD;  Location: [facility];  Service: Cardiovascular;  Laterality: Bilateral;  . THYROIDECTOMY  2000     Current Outpatient Medications  Medication Sig Dispense Refill  . amLODipine (NORVASC) 10 MG tablet TAKE 1 TABLET BY MOUTH ONCE DAILY (Patient taking differently: TAKE 10 MG BY MOUTH ONCE DAILY) 30 tablet 5  . aspirin 81 MG EC tablet Take 81 mg by mouth daily.  0  . atorvastatin (LIPITOR) 40 MG tablet Take 1 tablet (40 mg total) by mouth at bedtime. 90 tablet 3  . budesonide-formoterol (SYMBICORT) 160-4.5  MCG/ACT inhaler Inhale 2 puffs into the lungs daily. Rinse mouth after use. 1 Inhaler 12  . busPIRone (BUSPAR) 7.5 MG tablet Take 7.5 mg by mouth 2 (two) times daily as needed (for anxiety).     . carvedilol (COREG) 12.5 MG tablet Take 2 tablets (25 mg total) by mouth 2 (two) times daily. 60 tablet 6  . cloNIDine (CATAPRES) 0.2 MG tablet Take 0.1 mg by mouth 2 (two) times daily.    . fluocinonide cream (LIDEX) 7.10 % Apply 1 application topically daily as needed (psoriasis).     . furosemide (LASIX) 40 MG tablet Take 1 tablet  (40 mg total) by mouth daily. 30 tablet   . levothyroxine (SYNTHROID, LEVOTHROID) 200 MCG tablet Take 200 mcg by mouth daily before breakfast.    . lisinopril (PRINIVIL,ZESTRIL) 40 MG tablet Take 40 mg by mouth daily.    . naproxen sodium (ANAPROX) 220 MG tablet Take 440 mg by mouth 2 (two) times daily as needed (for pain or headaches).     Marland Kitchen omeprazole (PRILOSEC) 40 MG capsule Take 40 mg by mouth daily.     . traMADol (ULTRAM) 50 MG tablet Take 50 mg by mouth every 6 (six) hours as needed for moderate pain.   0  . traZODone (DESYREL) 50 MG tablet Take 50 mg by mouth at bedtime.     No current facility-administered medications for this visit.     Allergies:   Hydrocodone-acetaminophen; Latex; Loracarbef; and Penicillins    Social History:  The patient  reports that she has quit smoking. Her smoking use included cigarettes. She has a 5.00 pack-year smoking history. She has never used smokeless tobacco. She reports that she does not drink alcohol or use drugs.   Family History:  The patient's family history includes Diabetes in her mother.    ROS:  Please see the history of present illness.   Otherwise, review of systems are positive for none.   All other systems are reviewed and negative.    PHYSICAL EXAM: VS:  BP (!) 150/100 (BP Location: Right Arm, Patient Position: Sitting, Cuff Size: Normal)   Pulse 62   Ht 5\' 8"  (1.727 m)   Wt 281 lb (127.5 kg)   BMI 42.73 kg/m  , BMI Body mass index is 42.73 kg/m. GEN: Well nourished, well developed, in no acute distress  HEENT: normal  Neck: no JVD, carotid bruits, or masses Cardiac: RRR; no murmurs, rubs, or gallops,no edema  Respiratory:  clear to auscultation bilaterally, normal work of breathing GI: soft, nontender, nondistended, + BS MS: no deformity or atrophy  Skin: warm and dry, no rash Neuro:  Strength and sensation are intact Psych: euthymic mood, full affect Right radial pulses normal with no hematoma.  EKG:  EKG is not  ordered today.    Recent Labs: 01/10/2018: BUN 9; Creatinine, Ser 0.77; Hemoglobin 15.2; Platelets 253; Potassium 4.1; Sodium 138    Lipid Panel No results found for: CHOL, TRIG, HDL, CHOLHDL, VLDL, LDLCALC, LDLDIRECT    Wt Readings from Last 3 Encounters:  02/04/18 281 lb (127.5 kg)  01/17/18 282 lb (127.9 kg)  01/11/18 282 lb (127.9 kg)       No flowsheet data found.    ASSESSMENT AND PLAN:  1.  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  Volume overload improved with the daily use of furosemide 40 mg once daily.  2.  Coronary artery disease involving native coronary arteries  with other forms of angina: She has a chronically occluded left circumflex with faint collaterals.  Recommend continuing medical therapy.  Symptoms controlled with a beta-blocker and a calcium channel blocker.  3.  Essential hypertension: Untreated sleep apnea is likely contributing to uncontrolled hypertension in addition to morbid obesity. I added spironolactone 25 mg once daily.  Check basic metabolic profile in 1 week.  4.  Hyperlipidemia: Continue treatment with atorvastatin.  Target LDL is less than 70.  5.  Morbid obesity: I discussed with her the importance of healthy diet and regular exercise.  Surgical weight loss should be left as a last resort.    Disposition:   FU with me in 2 months  Signed,  [name], MD  02/04/2018 11:46 AM    [employer]

## 2018-02-04 NOTE — Patient Instructions (Addendum)
Medication Instructions:  Your physician has recommended you make the following change in your medication:  START spironolactone 25mg  once daily  Labwork: BMET in one week at the [facility]. No appt needed. Hours 7am-6pm.   Testing/Procedures: none  Follow-Up: Your physician recommends that you schedule a follow-up appointment in: 2 months with Dr. Fletcher Anon.    Any Other Special Instructions Will Be Listed Below (If Applicable).     If you need a refill on your cardiac medications before your next appointment, please call your pharmacy.

## 2018-02-09 DIAGNOSIS — E782 Mixed hyperlipidemia: Secondary | ICD-10-CM | POA: Diagnosis not present

## 2018-02-09 DIAGNOSIS — K219 Gastro-esophageal reflux disease without esophagitis: Secondary | ICD-10-CM | POA: Diagnosis not present

## 2018-02-09 DIAGNOSIS — E890 Postprocedural hypothyroidism: Secondary | ICD-10-CM | POA: Diagnosis not present

## 2018-03-21 NOTE — Progress Notes (Deleted)
Assessment and Plan:  Obstructive Sleep apnea. -HST 01/18/18; AHI 25; started on CPAP 5-20. - We will send for sleep study.  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 - History of smoking, dyspnea on exertion, likely secondary to COPD - Patient was educated on use of Symbicort, currently she is using it only as needed, she is instructed to use 2 puffs once a day. - In the meantime we will check a pulmonary function test before follow-up.  Stroke/TIA. -Obstructive sleep apnea can increase the risk of strokes, therefore it is important to treat the patient's underlying obstructive sleep apnea.  Addendum 01/26/18. Patient was seen in the ED on 01/10/18 for chest discomfort.  She underwent a CT chest on 3/11, images personally reviewed there is reduced lung volumes secondary to body habitus, there is a right upper lobe calcified granuloma, there are tiny bilateral lung nodules, largest of which is 4 mm.  Personally reviewed PFT tracings from 01/04/18; FVC 64% predicted, FEV1 of 67% predicted.  There is no improvement with bronchodilator, ratio is 83%. TLC is 67% predicted, RV/TLC ratio is normal.  DLCO is 59% flow volume loop is restricted. -Overall this test is consistent with moderate restrictive lung disease.   **HST 01/18/18; AHI 25.

## 2018-03-22 ENCOUNTER — Ambulatory Visit: Payer: BLUE CROSS/BLUE SHIELD | Admitting: Internal Medicine

## 2018-03-24 ENCOUNTER — Ambulatory Visit: Payer: BLUE CROSS/BLUE SHIELD | Admitting: Internal Medicine

## 2018-03-29 ENCOUNTER — Ambulatory Visit: Payer: BLUE CROSS/BLUE SHIELD | Admitting: Internal Medicine

## 2018-03-29 ENCOUNTER — Telehealth: Payer: Self-pay | Admitting: Cardiovascular Disease

## 2018-03-29 NOTE — Telephone Encounter (Signed)
Received records request Kathleen [name], forwarded to [facility] for processing.

## 2018-03-29 NOTE — Progress Notes (Deleted)
Assessment and Plan:  Obstructive Sleep apnea. -HST 01/18/18; AHI 25; started on CPAP 5-20. - We will send for sleep study.  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 - History of smoking, dyspnea on exertion, likely secondary to COPD - Patient was educated on use of Symbicort, currently she is using it only as needed, she is instructed to use 2 puffs once a day. - In the meantime we will check a pulmonary function test before follow-up.  Stroke/TIA. -Obstructive sleep apnea can increase the risk of strokes, therefore it is important to treat the patient's underlying obstructive sleep apnea.  Addendum 01/26/18. Patient was seen in the ED on 01/10/18 for chest discomfort.  She underwent a CT chest on 3/11, images personally reviewed there is reduced lung volumes secondary to body habitus, there is a right upper lobe calcified granuloma, there are tiny bilateral lung nodules, largest of which is 4 mm.  Personally reviewed PFT tracings from 01/04/18; FVC 64% predicted, FEV1 of 67% predicted.  There is no improvement with bronchodilator, ratio is 83%. TLC is 67% predicted, RV/TLC ratio is normal.  DLCO is 59% flow volume loop is restricted. -Overall this test is consistent with moderate restrictive lung disease.   **HST 01/18/18; AHI 25.

## 2018-03-30 ENCOUNTER — Encounter: Payer: Self-pay | Admitting: Internal Medicine

## 2018-03-31 ENCOUNTER — Ambulatory Visit (INDEPENDENT_AMBULATORY_CARE_PROVIDER_SITE_OTHER): Payer: BLUE CROSS/BLUE SHIELD | Admitting: Internal Medicine

## 2018-03-31 ENCOUNTER — Encounter: Payer: Self-pay | Admitting: Internal Medicine

## 2018-03-31 VITALS — BP 130/84 | HR 58 | Resp 16 | Ht 68.0 in | Wt 282.0 lb

## 2018-03-31 DIAGNOSIS — J449 Chronic obstructive pulmonary disease, unspecified: Secondary | ICD-10-CM

## 2018-03-31 DIAGNOSIS — G4733 Obstructive sleep apnea (adult) (pediatric): Secondary | ICD-10-CM | POA: Diagnosis not present

## 2018-03-31 NOTE — Patient Instructions (Signed)
Continue using CPAP. -Continue using Symbicort.  -We will refer to mask fitting clinic.

## 2018-03-31 NOTE — Progress Notes (Signed)
* [facility]    Assessment and Plan:  Obstructive Sleep apnea. -HST 01/18/18; AHI 25; started on CPAP 5-20. - Currently using CPAP, feeling better but still having trouble adjusting to the mask. - We will refer to mask fitting clinic.  Dyspnea on exertion, likely multifactorial from mild COPD, as well as restrictive lung disease from body habitus.. - History of smoking, dyspnea on exertion. - Currently on Symbicort 1 puff twice daily, notes improvement with this.  Will continue.  Stroke/TIA. -Obstructive sleep apnea can increase the risk of strokes, therefore it is important to treat the patient's underlying obstructive sleep apnea.   Return in about 6 months (around 10/01/2018).    Date: 03/31/2018  MRN# [id number] Kathleen [name] Jan 21, 1956   Kathleen [name] is a 62 y.o. old female seen in follow up for chief complaint of  Chief Complaint  Patient presents with  . Sleep Apnea    pt here for compliance visit she wears cpap almost every night 4-6 hours.  Marland Kitchen COPD    sob still unchanged  . Cough    non productive every morning     HPI:  Since the patient's last visit she has been started on CPAP, she does have some trouble tolerating the mask.  She is on a full facial mask.  She usually wears it at bedtime, she may be awake for an hour before falling asleep, often takes it off the middle the night, sometimes forgets to put it back on.  She is feeling more awake during the day, she is no longer snoring, however she finds that adjustment to the mask has still been an issue. She is using Symbicort 1 puff twice daily, she feels that this helps with her breathing.  **Images personally reviewed CT chest on 3/11, images personally reviewed there is reduced lung volumes secondary to body habitus, there is a right upper lobe calcified granuloma, there are tiny bilateral lung nodules, largest of which is 4 mm.  **Personally reviewed PFT tracings from 01/04/18; FVC 64%  predicted, FEV1 of 67% predicted.  There is no improvement with bronchodilator, ratio is 83%. TLC is 67% predicted, RV/TLC ratio is normal.  DLCO is 59% flow volume loop is restricted. -Overall this test is consistent with moderate restrictive lung disease.  **I personally reviewed download data 30 days as of/03/30/2018 uses greater than 4 hours is 25/30 days.  Average usage on days used is 5 hours 39 minutes.  Auto set 5-20.  Median pressure 10, 95th percentile pressure 14, maximum pressure 15.  Residual AHI is 1.2.  Overall this shows good compliance with CPAP, excellent control of obstructive sleep apnea. **HST 01/18/18; AHI 25.  Medication:    Current Outpatient Medications:  .  amLODipine (NORVASC) 10 MG tablet, TAKE 1 TABLET BY MOUTH ONCE DAILY (Patient taking differently: TAKE 10 MG BY MOUTH ONCE DAILY), Disp: 30 tablet, Rfl: 5 .  aspirin 81 MG EC tablet, Take 81 mg by mouth daily., Disp: , Rfl: 0 .  atorvastatin (LIPITOR) 40 MG tablet, Take 1 tablet (40 mg total) by mouth at bedtime., Disp: 90 tablet, Rfl: 3 .  budesonide-formoterol (SYMBICORT) 160-4.5 MCG/ACT inhaler, Inhale 2 puffs into the lungs daily. Rinse mouth after use., Disp: 1 Inhaler, Rfl: 12 .  busPIRone (BUSPAR) 7.5 MG tablet, Take 7.5 mg by mouth 2 (two) times daily as needed (for anxiety). , Disp: , Rfl:  .  carvedilol (COREG) 12.5 MG tablet, Take 2 tablets (25 mg total) by  mouth 2 (two) times daily., Disp: 60 tablet, Rfl: 6 .  fluocinonide cream (LIDEX) 4.09 %, Apply 1 application topically daily as needed (psoriasis). , Disp: , Rfl:  .  furosemide (LASIX) 40 MG tablet, Take 1 tablet (40 mg total) by mouth daily., Disp: 30 tablet, Rfl:  .  levothyroxine (SYNTHROID, LEVOTHROID) 200 MCG tablet, Take 200 mcg by mouth daily before breakfast., Disp: , Rfl:  .  lisinopril (PRINIVIL,ZESTRIL) 40 MG tablet, Take 40 mg by mouth daily., Disp: , Rfl:  .  naproxen sodium (ANAPROX) 220 MG tablet, Take 440 mg by mouth 2 (two) times daily  as needed (for pain or headaches). , Disp: , Rfl:  .  omeprazole (PRILOSEC) 40 MG capsule, Take 40 mg by mouth daily. , Disp: , Rfl:  .  spironolactone (ALDACTONE) 25 MG tablet, Take 1 tablet (25 mg total) by mouth daily., Disp: 90 tablet, Rfl: 3 .  traMADol (ULTRAM) 50 MG tablet, Take 50 mg by mouth every 6 (six) hours as needed for moderate pain. , Disp: , Rfl: 0 .  traZODone (DESYREL) 50 MG tablet, Take 50 mg by mouth at bedtime., Disp: , Rfl:    Allergies:  Hydrocodone-acetaminophen; Latex; Loracarbef; and Penicillins  Review of Systems: Gen:  Denies  fever, sweats. HEENT: Denies blurred vision. Cvc:  No dizziness, chest pain or heaviness Resp:   Denies cough or sputum porduction. Gi: Denies swallowing difficulty, stomach pain. constipation, bowel incontinence Gu:  Denies bladder incontinence, burning urine Ext:   No Joint pain, stiffness. Skin: No skin rash, easy bruising. Endoc:  No polyuria, polydipsia. Psych: No depression, insomnia. Other:  All other systems were reviewed and found to be negative other than what is mentioned in the HPI.   Physical Examination:   VS: BP 130/84 (BP Location: Left Arm, Cuff Size: Large)   Pulse (!) 58   Resp 16   Ht 5\' 8"  (1.727 m)   Wt 282 lb (127.9 kg)   SpO2 93%   BMI 42.88 kg/m    General Appearance: No distress  Neuro:without focal findings,  speech normal,  HEENT: PERRLA, EOM intact. Pulmonary: normal breath sounds, No wheezing.   CardiovascularNormal S1,S2.  No m/r/g.   Abdomen: Benign, Soft, non-tender. Renal:  No costovertebral tenderness  GU:  Not performed at this time. Endoc: No evident thyromegaly, no signs of acromegaly. Skin:   warm, no rash. Extremities: normal, no cyanosis, clubbing.   LABORATORY PANEL:   CBC No results for input(s): WBC, HGB, HCT, PLT in the last 168 hours. ------------------------------------------------------------------------------------------------------------------  Chemistries  No  results for input(s): NA, K, CL, CO2, GLUCOSE, BUN, CREATININE, CALCIUM, MG, AST, ALT, ALKPHOS, BILITOT in the last 168 hours.  Invalid input(s): GFRCGP ------------------------------------------------------------------------------------------------------------------  Cardiac Enzymes No results for input(s): TROPONINI in the last 168 hours. ------------------------------------------------------------  RADIOLOGY:   No results found for this or any previous visit. Results for orders placed during the hospital encounter of 01/10/18  DG Chest 2 View   Narrative CLINICAL DATA:  Chest pain.  EXAM: CHEST - 2 VIEW  COMPARISON:  Radiographs of April 17, 2016.  FINDINGS: The heart size and mediastinal contours are within normal limits. No pneumothorax or pleural effusion is noted. Stable calcified granuloma noted in right upper lobe. No acute pulmonary disease is noted. The visualized skeletal structures are unremarkable.  IMPRESSION: No active cardiopulmonary disease.   Electronically Signed   By: Marijo Conception, M.D.   On: 01/10/2018 12:51    ------------------------------------------------------------------------------------------------------------------  Thank  you for allowing [facility] to assist in the care of your patient. Our recommendations are noted above.  Please contact us if we can be of further service.   Marda Stalker, MD.  [facility] Office Number: [phone]  Patricia Pesa, M.D.  Merton Border, M.D  03/31/2018

## 2018-03-31 NOTE — Addendum Note (Signed)
Addended by: Stephanie Coup on: 03/31/2018 10:33 AM   Modules accepted: Orders

## 2018-04-05 ENCOUNTER — Encounter: Payer: Self-pay | Admitting: Cardiovascular Disease

## 2018-04-05 ENCOUNTER — Ambulatory Visit: Payer: BLUE CROSS/BLUE SHIELD | Admitting: Cardiovascular Disease

## 2018-04-05 VITALS — BP 228/109 | HR 61 | Ht 68.0 in | Wt 281.4 lb

## 2018-04-05 DIAGNOSIS — I10 Essential (primary) hypertension: Secondary | ICD-10-CM

## 2018-04-05 DIAGNOSIS — E785 Hyperlipidemia, unspecified: Secondary | ICD-10-CM

## 2018-04-05 DIAGNOSIS — I2510 Atherosclerotic heart disease of native coronary artery without angina pectoris: Secondary | ICD-10-CM

## 2018-04-05 DIAGNOSIS — I5032 Chronic diastolic (congestive) heart failure: Secondary | ICD-10-CM

## 2018-04-05 LAB — BASIC METABOLIC PANEL WITH GFR
BUN/Creatinine Ratio: 16 (ref 12–28)
BUN: 12 mg/dL (ref 8–27)
CO2: 23 mmol/L (ref 20–29)
Calcium: 9.9 mg/dL (ref 8.7–10.3)
Chloride: 103 mmol/L (ref 96–106)
Creatinine, Ser: 0.76 mg/dL (ref 0.57–1.00)
GFR calc Af Amer: 98 mL/min/{1.73_m2}
GFR calc non Af Amer: 85 mL/min/{1.73_m2}
Glucose: 83 mg/dL (ref 65–99)
Potassium: 4.4 mmol/L (ref 3.5–5.2)
Sodium: 141 mmol/L (ref 134–144)

## 2018-04-05 MED ORDER — CLONIDINE HCL 0.1 MG PO TABS: 0.1000 mg | ORAL_TABLET | Freq: Two times a day (BID) | ORAL | 11 refills | Status: DC

## 2018-04-05 NOTE — Patient Instructions (Signed)
Medication Instructions: START Clonidine 0.1 mg twice daily  If you need a refill on your cardiac medications before your next appointment, please call your pharmacy.   Labwork: Your provider would like for you to have the following labs today: BMET   Procedures/Testing: Your physician has requested that you have a renal artery duplex. During this test, an ultrasound is used to evaluate blood flow to the kidneys. Allow one hour for this exam. Do not eat after midnight the day before and avoid carbonated beverages. Take your medications as you usually do. This will take place at [place], [address].   Follow-Up: Your physician wants you to follow-up in 3 months with Dr. Fletcher Anon.    Thank you for choosing [facility]!!

## 2018-04-05 NOTE — Progress Notes (Signed)
Cardiology Office Note   Date:  04/05/2018   ID:  Kathleen [name], DOB 1956/10/02, MRN [id number]  PCP:  [facility]  Cardiologist:   Kathleen Sacramento, MD   No chief complaint on file.     History of Present Illness: Kathleen [name] is a 62 y.o. female who presents for a follow-up visit regarding coronary artery disease, chronic diastolic heart failure and refractory hypertension.  Other medical problems include hyperlipidemia, previous tobacco use, possible COPD, hypothyroidism, GERD, morbid obesity and sleep apnea.  She had worsening heart failure in March of this year. A right and left cardiac catheterization showed severe one-vessel coronary artery disease with chronically occluded mid left circumflex with very faint collaterals from the LAD.  Ejection fraction was normal.  Right heart catheterization showed moderately elevated filling pressures, moderate pulmonary hypertension and mildly reduced cardiac output.  Systemic blood pressure was severely elevated during cardiac catheterization.  During last visit, I added spironolactone 25 mg once daily.  Her blood pressure has been extremely difficult to control in spite of multiple medications.  She was diagnosed with sleep apnea and started using CPAP.  She reports being under significant stress recently due to problems with the [employer]. She denies any chest pain.  Shortness of breath remains stable. She takes Aleve as needed for severe headache but does not use this very frequently.   Past Medical History:  Diagnosis Date  . Arthritis   . Cancer ([place])   . Chronic back pain   . Complication of anesthesia    Woke up during  Hysterectomy  . Coronary artery disease 2012   Mild to Moderate  . Family history of adverse reaction to anesthesia   . GERD (gastroesophageal reflux disease)    omeprazole prn  . Heart murmur    "nothing to be concerned about  . History of blood transfusion   . Hx of thyroid cancer   . Hyperlipidemia    . Hypertension   . Hypothyroidism   . Obesity   . PONV (postoperative nausea and vomiting)    with thyroid surgery  . Shortness of breath dyspnea    with exertion  . Sleep apnea, obstructive    possible  . Tobacco abuse     Past Surgical History:  Procedure Laterality Date  . ABDOMINAL HYSTERECTOMY  1999  . CARDIAC CATHETERIZATION  Feb 2012   LAD: 60% mid (FFR ratio 0.83), LCX: 30-40%, RCA 20%.   Marland Kitchen KNEE ARTHROPLASTY Right 05/01/2016   Procedure:  TOTAL KNEE ARTHROPLASTY;  Surgeon: Marybelle Killings, MD;  Location: [facility];  Service: Orthopedics;  Laterality: Right;  . RIGHT/LEFT HEART CATH AND CORONARY ANGIOGRAPHY Bilateral 01/17/2018   Procedure: RIGHT/LEFT HEART CATH AND CORONARY ANGIOGRAPHY;  Surgeon: Wellington Hampshire, MD;  Location: [facility];  Service: Cardiovascular;  Laterality: Bilateral;  . THYROIDECTOMY  2000     Current Outpatient Medications  Medication Sig Dispense Refill  . amLODipine (NORVASC) 10 MG tablet TAKE 1 TABLET BY MOUTH ONCE DAILY 30 tablet 5  . aspirin 81 MG EC tablet Take 81 mg by mouth daily.  0  . atorvastatin (LIPITOR) 40 MG tablet Take 1 tablet (40 mg total) by mouth at bedtime. 90 tablet 3  . budesonide-formoterol (SYMBICORT) 160-4.5 MCG/ACT inhaler Inhale 2 puffs into the lungs daily. Rinse mouth after use. 1 Inhaler 12  . busPIRone (BUSPAR) 15 MG tablet Take 1 tablet by mouth daily.  10  . carvedilol (COREG) 12.5 MG tablet  Take 2 tablets (25 mg total) by mouth 2 (two) times daily. 60 tablet 6  . fluocinonide cream (LIDEX) 3.71 % Apply 1 application topically daily as needed (psoriasis).     Marland Kitchen FLUoxetine (PROZAC) 20 MG capsule Take 1 capsule by mouth daily.  1  . furosemide (LASIX) 40 MG tablet Take 1 tablet (40 mg total) by mouth daily. 30 tablet   . levothyroxine (SYNTHROID, LEVOTHROID) 200 MCG tablet Take 200 mcg by mouth daily before breakfast.    . lisinopril (PRINIVIL,ZESTRIL) 40 MG tablet Take 40 mg by mouth daily.    . naproxen  sodium (ANAPROX) 220 MG tablet Take 440 mg by mouth 2 (two) times daily as needed (for pain or headaches).     Marland Kitchen omeprazole (PRILOSEC) 40 MG capsule Take 40 mg by mouth daily.     . phentermine 37.5 MG capsule Take 37.5 mg by mouth every morning.    Marland Kitchen spironolactone (ALDACTONE) 25 MG tablet Take 1 tablet (25 mg total) by mouth daily. 90 tablet 3  . traMADol (ULTRAM) 50 MG tablet Take 50 mg by mouth every 6 (six) hours as needed for moderate pain.   0  . traZODone (DESYREL) 50 MG tablet Take 50 mg by mouth at bedtime.     No current facility-administered medications for this visit.     Allergies:   Hydrocodone-acetaminophen; Latex; Loracarbef; and Penicillins    Social History:  The patient  reports that she has quit smoking. Her smoking use included cigarettes. She has a 5.00 pack-year smoking history. She has never used smokeless tobacco. She reports that she does not drink alcohol or use drugs.   Family History:  The patient's family history includes Diabetes in her mother.    ROS:  Please see the history of present illness.   Otherwise, review of systems are positive for none.   All other systems are reviewed and negative.    PHYSICAL EXAM: VS:  BP (!) 228/109   Pulse 61   Ht 5\' 8"  (1.727 m)   Wt 281 lb 6.4 oz (127.6 kg)   BMI 42.79 kg/m  , BMI Body mass index is 42.79 kg/m. GEN: Well nourished, well developed, in no acute distress  HEENT: normal  Neck: no JVD, carotid bruits, or masses Cardiac: RRR; no murmurs, rubs, or gallops,no edema  Respiratory:  clear to auscultation bilaterally, normal work of breathing GI: soft, nontender, nondistended, + BS MS: no deformity or atrophy  Skin: warm and dry, no rash Neuro:  Strength and sensation are intact Psych: euthymic mood, full affect Right radial pulses normal with no hematoma.  EKG:  EKG is not ordered today.    Recent Labs: 01/10/2018: BUN 9; Creatinine, Ser 0.77; Hemoglobin 15.2; Platelets 253; Potassium 4.1; Sodium  138    Lipid Panel No results found for: CHOL, TRIG, HDL, CHOLHDL, VLDL, LDLCALC, LDLDIRECT    Wt Readings from Last 3 Encounters:  04/05/18 281 lb 6.4 oz (127.6 kg)  03/31/18 282 lb (127.9 kg)  02/04/18 281 lb (127.5 kg)       No flowsheet data found.    ASSESSMENT AND PLAN:  1.  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  Volume overload improved with the daily use of furosemide 40 mg once daily.  2.  Coronary artery disease involving native coronary arteries with other forms of angina: She has a chronically occluded left circumflex with faint collaterals.  Recommend continuing medical therapy.  3.  Essential hypertension: Blood pressure continues to be extremely elevated in spite of multiple blood pressure medications.  She reports that this is something that runs in her family. I might consider switching furosemide to a thiazide diuretic down the road.  I elected to add clonidine 0.1 mg twice daily. I requested renal artery duplex to evaluate for possible renal artery stenosis although the study might be limited by obesity.  4.  Hyperlipidemia: Continue treatment with atorvastatin.  Target LDL is less than 70.  5.  Morbid obesity: I discussed with her the importance of healthy diet and regular exercise.  Surgical weight loss should be left as a last resort.    Disposition:   FU with me in 3 months  Signed,  Kathleen Sacramento, MD  04/05/2018 10:41 AM    [place]

## 2018-04-06 ENCOUNTER — Other Ambulatory Visit (HOSPITAL_BASED_OUTPATIENT_CLINIC_OR_DEPARTMENT_OTHER): Payer: BLUE CROSS/BLUE SHIELD

## 2018-04-15 ENCOUNTER — Ambulatory Visit (HOSPITAL_COMMUNITY): Payer: BLUE CROSS/BLUE SHIELD

## 2018-04-19 ENCOUNTER — Ambulatory Visit (HOSPITAL_COMMUNITY)
Admission: RE | Admit: 2018-04-19 | Payer: BLUE CROSS/BLUE SHIELD | Source: Ambulatory Visit | Attending: Cardiovascular Disease | Admitting: Cardiovascular Disease

## 2018-05-17 ENCOUNTER — Encounter (HOSPITAL_COMMUNITY): Payer: Self-pay | Admitting: Cardiovascular Disease

## 2018-06-10 ENCOUNTER — Other Ambulatory Visit: Payer: Self-pay | Admitting: Cardiovascular Disease

## 2018-06-10 DIAGNOSIS — I10 Essential (primary) hypertension: Secondary | ICD-10-CM

## 2018-06-14 ENCOUNTER — Ambulatory Visit (HOSPITAL_COMMUNITY)
Admission: RE | Admit: 2018-06-14 | Discharge: 2018-06-14 | Disposition: A | Discharge status: Home / Self Care | Payer: Self-pay | Source: Ambulatory Visit | Attending: Cardiology | Admitting: Cardiology

## 2018-06-14 DIAGNOSIS — I10 Essential (primary) hypertension: Secondary | ICD-10-CM | POA: Insufficient documentation

## 2018-06-21 DIAGNOSIS — K219 Gastro-esophageal reflux disease without esophagitis: Secondary | ICD-10-CM | POA: Insufficient documentation

## 2018-06-21 DIAGNOSIS — Z08 Encounter for follow-up examination after completed treatment for malignant neoplasm: Secondary | ICD-10-CM | POA: Insufficient documentation

## 2018-06-21 DIAGNOSIS — Z85850 Personal history of malignant neoplasm of thyroid: Secondary | ICD-10-CM

## 2018-06-21 HISTORY — DX: Encounter for follow-up examination after completed treatment for malignant neoplasm: Z08

## 2018-06-21 HISTORY — DX: Personal history of malignant neoplasm of thyroid: Z85.850

## 2018-06-22 ENCOUNTER — Other Ambulatory Visit: Payer: Self-pay | Admitting: Otolaryngology

## 2018-06-22 DIAGNOSIS — K219 Gastro-esophageal reflux disease without esophagitis: Secondary | ICD-10-CM

## 2018-06-24 ENCOUNTER — Other Ambulatory Visit: Payer: Self-pay | Admitting: Otolaryngology

## 2018-06-24 DIAGNOSIS — Z85850 Personal history of malignant neoplasm of thyroid: Secondary | ICD-10-CM

## 2018-06-24 DIAGNOSIS — K219 Gastro-esophageal reflux disease without esophagitis: Secondary | ICD-10-CM

## 2018-06-28 ENCOUNTER — Inpatient Hospital Stay
Admission: RE | Admit: 2018-06-28 | Discharge: 2018-06-28 | Disposition: A | Discharge status: Home / Self Care | Payer: Self-pay | Source: Ambulatory Visit | Attending: Otolaryngology | Admitting: Otolaryngology

## 2018-07-05 ENCOUNTER — Ambulatory Visit (INDEPENDENT_AMBULATORY_CARE_PROVIDER_SITE_OTHER): Payer: Self-pay | Admitting: Cardiovascular Disease

## 2018-07-05 VITALS — BP 215/132 | HR 59 | Ht 68.0 in | Wt 281.6 lb

## 2018-07-05 DIAGNOSIS — I25118 Atherosclerotic heart disease of native coronary artery with other forms of angina pectoris: Secondary | ICD-10-CM

## 2018-07-05 DIAGNOSIS — I10 Essential (primary) hypertension: Secondary | ICD-10-CM

## 2018-07-05 DIAGNOSIS — E782 Mixed hyperlipidemia: Secondary | ICD-10-CM

## 2018-07-05 DIAGNOSIS — I701 Atherosclerosis of renal artery: Secondary | ICD-10-CM

## 2018-07-05 NOTE — Progress Notes (Signed)
Cardiology Office Note   Date:  07/05/2018   ID:  CAMRON ESSMAN, DOB 02/28/1956, MRN [id number]  PCP:  [facility]  Cardiologist:   [name], MD   No chief complaint on file.     History of Present Illness: Kathleen [name] is a 62 y.o. female who presents for a follow-up visit regarding coronary artery disease, chronic diastolic heart failure and refractory hypertension.  Other medical problems include hyperlipidemia, previous tobacco use, possible COPD, hypothyroidism, GERD, morbid obesity and sleep apnea.  She had worsening heart failure in March of this year. A right and left cardiac catheterization showed severe one-vessel coronary artery disease with chronically occluded mid left circumflex with very faint collaterals from the LAD.  Ejection fraction was normal.  Right heart catheterization showed moderately elevated filling pressures, moderate pulmonary hypertension and mildly reduced cardiac output.  Systemic blood pressure was severely elevated during cardiac catheterization. She was diagnosed with sleep apnea and started using CPAP.  She reports being under significant stress recently due to problems with the [employer]. During last visit, I added clonidine 0.1 mg twice daily. She underwent renal artery duplex last month which showed evidence of severe right renal artery stenosis with a peak velocity of 390 and RAR of 5.2.  Kidney size was 12 cm on the right and 11 cm on the left. Blood pressure continues to be significantly elevated in spite of multiple blood pressure medications.  She had intermittent chest pain over the weekend described as tightness feeling.  No chest pain today. She is supposed to start the jail sentence in October for tax related issues.   Past Medical History:  Diagnosis Date  . Arthritis   . Cancer ([place])   . Chronic back pain   . Complication of anesthesia    Woke up during  Hysterectomy  . Coronary artery disease 2012   Mild to Moderate   . Family history of adverse reaction to anesthesia   . GERD (gastroesophageal reflux disease)    omeprazole prn  . Heart murmur    "nothing to be concerned about  . History of blood transfusion   . Hx of thyroid cancer   . Hyperlipidemia   . Hypertension   . Hypothyroidism   . Obesity   . PONV (postoperative nausea and vomiting)    with thyroid surgery  . Shortness of breath dyspnea    with exertion  . Sleep apnea, obstructive    possible  . Tobacco abuse     Past Surgical History:  Procedure Laterality Date  . ABDOMINAL HYSTERECTOMY  1999  . CARDIAC CATHETERIZATION  Feb 2012   LAD: 60% mid (FFR ratio 0.83), LCX: 30-40%, RCA 20%.   Marland Kitchen KNEE ARTHROPLASTY Right 05/01/2016   Procedure:  TOTAL KNEE ARTHROPLASTY;  Surgeon: Marybelle Killings, MD;  Location: [facility];  Service: Orthopedics;  Laterality: Right;  . RIGHT/LEFT HEART CATH AND CORONARY ANGIOGRAPHY Bilateral 01/17/2018   Procedure: RIGHT/LEFT HEART CATH AND CORONARY ANGIOGRAPHY;  Surgeon: Wellington Hampshire, MD;  Location: [facility];  Service: Cardiovascular;  Laterality: Bilateral;  . THYROIDECTOMY  2000     Current Outpatient Medications  Medication Sig Dispense Refill  . amLODipine (NORVASC) 10 MG tablet TAKE 1 TABLET BY MOUTH ONCE DAILY 30 tablet 5  . aspirin 81 MG EC tablet Take 81 mg by mouth daily.  0  . atorvastatin (LIPITOR) 40 MG tablet Take 1 tablet (40 mg total) by mouth at bedtime. 90 tablet  3  . budesonide-formoterol (SYMBICORT) 160-4.5 MCG/ACT inhaler Inhale 2 puffs into the lungs daily. Rinse mouth after use. 1 Inhaler 12  . busPIRone (BUSPAR) 15 MG tablet Take 1 tablet by mouth daily.  10  . carvedilol (COREG) 12.5 MG tablet Take 2 tablets (25 mg total) by mouth 2 (two) times daily. 60 tablet 6  . cloNIDine (CATAPRES) 0.1 MG tablet Take 1 tablet (0.1 mg total) by mouth 2 (two) times daily. 60 tablet 11  . fluocinonide cream (LIDEX) 1.19 % Apply 1 application topically daily as needed (psoriasis).     .  furosemide (LASIX) 40 MG tablet Take 1 tablet (40 mg total) by mouth daily. 30 tablet   . levothyroxine (SYNTHROID, LEVOTHROID) 200 MCG tablet Take 200 mcg by mouth daily before breakfast.    . lisinopril (PRINIVIL,ZESTRIL) 40 MG tablet Take 40 mg by mouth daily.    . naproxen sodium (ANAPROX) 220 MG tablet Take 440 mg by mouth 2 (two) times daily as needed (for pain or headaches).     Marland Kitchen omeprazole (PRILOSEC) 40 MG capsule Take 40 mg by mouth daily.     Marland Kitchen spironolactone (ALDACTONE) 25 MG tablet Take 1 tablet (25 mg total) by mouth daily. 90 tablet 3  . traMADol (ULTRAM) 50 MG tablet Take 50 mg by mouth every 6 (six) hours as needed for moderate pain.   0  . traZODone (DESYREL) 50 MG tablet Take 50 mg by mouth at bedtime.     No current facility-administered medications for this visit.     Allergies:   Hydrocodone-acetaminophen; Latex; Loracarbef; and Penicillins    Social History:  The patient  reports that she has quit smoking. Her smoking use included cigarettes. She has a 5.00 pack-year smoking history. She has never used smokeless tobacco. She reports that she does not drink alcohol or use drugs.   Family History:  The patient's family history includes Diabetes in her mother.    ROS:  Please see the history of present illness.   Otherwise, review of systems are positive for none.   All other systems are reviewed and negative.    PHYSICAL EXAM: VS:  BP (!) 215/132   Pulse (!) 59   Ht 5\' 8"  (1.727 m)   Wt 281 lb 9.6 oz (127.7 kg)   BMI 42.82 kg/m  , BMI Body mass index is 42.82 kg/m. GEN: Well nourished, well developed, in no acute distress  HEENT: normal  Neck: no JVD, carotid bruits, or masses Cardiac: RRR; no murmurs, rubs, or gallops,no edema  Respiratory:  clear to auscultation bilaterally, normal work of breathing GI: soft, nontender, nondistended, + BS MS: no deformity or atrophy  Skin: warm and dry, no rash Neuro:  Strength and sensation are intact Psych: euthymic  mood, full affect   EKG:  EKG is ordered today. EKG showed sinus bradycardia with first-degree AV block, LVH with repolarization abnormalities.   Recent Labs: 01/10/2018: Hemoglobin 15.2; Platelets 253 04/05/2018: BUN 12; Creatinine, Ser 0.76; Potassium 4.4; Sodium 141    Lipid Panel No results found for: CHOL, TRIG, HDL, CHOLHDL, VLDL, LDLCALC, LDLDIRECT    Wt Readings from Last 3 Encounters:  07/05/18 281 lb 9.6 oz (127.7 kg)  04/05/18 281 lb 6.4 oz (127.6 kg)  03/31/18 282 lb (127.9 kg)       No flowsheet data found.    ASSESSMENT AND PLAN:  1.  Chronic diastolic heart failure due to hypertensive heart disease: She appears to be euvolemic on current dose  of furosemide.  2.  Coronary artery disease involving native coronary arteries with other forms of angina: She has a chronically occluded left circumflex with faint collaterals.  Recent chest pain is likely due to uncontrolled hypertension.  Continue medical therapy.  Long-acting nitroglycerin can be considered if symptoms persist.  3.  Renovascular hypertension: Blood pressure continues to be extremely elevated in spite of 6 different blood pressure medications.  Due to that, there is an indication for renal artery revascularization.  I discussed the procedure with the patient in details as well as risks and benefits. I might consider angiography via the left radial artery if we have long enough catheter to reach the renal artery.  4.  Hyperlipidemia: Continue treatment with atorvastatin.  Target LDL is less than 70.    Disposition:   FU with me in 1 months  Signed,  [name], MD  07/05/2018 10:11 AM    [place]

## 2018-07-05 NOTE — Patient Instructions (Addendum)
Medication Instructions:  Your physician recommends that you continue on your current medications as directed. Please refer to the Current Medication list given to you today.   Labwork: Bmet, Cbc this week.   Testing/Procedures: Renal Angiogram scheduled on 07/20/18 with Dr.Arida. Please see your instructions below    Follow-Up: Your physician recommends that you schedule a follow-up appointment in: 1 month with Dr.Arida     Any Other Special Instructions Will Be Listed Below (If Applicable).     If you need a refill on your cardiac medications before your next appointment, please call your pharmacy.    [facility] [place] [address] Dept: [phone] Loc: [phone]  MELVENIA FAVELA  07/05/2018  You are scheduled for a Renal Angiogram on Wednesday, September 18 with Dr. [name].  1. Please arrive at the [facility] (Main Entrance A) at [facility]: [address] [place], [place] [address] at 6:30 AM (This time is two hours before your procedure to ensure your preparation). Free valet parking service is available.   Special note: Every effort is made to have your procedure done on time. Please understand that emergencies sometimes delay scheduled procedures.  2. Diet: Do not eat solid foods after midnight.  The patient may have clear liquids until 5am upon the day of the procedure.  3. Labs: You will need to have blood drawn on Friday, September 6 at [place]  Open: 8am - 5pm (Lunch 12:30 - 1:30)   Phone: [phone]. You do not need to be fasting.  4. Medication instructions in preparation for your procedure:   Contrast Allergy: No   Current Outpatient Medications (Endocrine & Metabolic):  .  levothyroxine (SYNTHROID, LEVOTHROID) 200 MCG tablet, Take 200 mcg by mouth daily before breakfast.  Current  Outpatient Medications (Cardiovascular):  .  amLODipine (NORVASC) 10 MG tablet, TAKE 1 TABLET BY MOUTH ONCE DAILY .  atorvastatin (LIPITOR) 40 MG tablet, Take 1 tablet (40 mg total) by mouth at bedtime. .  carvedilol (COREG) 12.5 MG tablet, Take 2 tablets (25 mg total) by mouth 2 (two) times daily. .  cloNIDine (CATAPRES) 0.1 MG tablet, Take 1 tablet (0.1 mg total) by mouth 2 (two) times daily. .  furosemide (LASIX) 40 MG tablet, Take 1 tablet (40 mg total) by mouth daily. Marland Kitchen  lisinopril (PRINIVIL,ZESTRIL) 40 MG tablet, Take 40 mg by mouth daily. Marland Kitchen  spironolactone (ALDACTONE) 25 MG tablet, Take 1 tablet (25 mg total) by mouth daily.  Current Outpatient Medications (Respiratory):  .  budesonide-formoterol (SYMBICORT) 160-4.5 MCG/ACT inhaler, Inhale 2 puffs into the lungs daily. Rinse mouth after use.  Current Outpatient Medications (Analgesics):  .  aspirin 81 MG EC tablet, Take 81 mg by mouth daily. .  naproxen sodium (ANAPROX) 220 MG tablet, Take 440 mg by mouth 2 (two) times daily as needed (for pain or headaches).  .  traMADol (ULTRAM) 50 MG tablet, Take 50 mg by mouth every 6 (six) hours as needed for moderate pain.    Current Outpatient Medications (Other):  .  busPIRone (BUSPAR) 15 MG tablet, Take 1 tablet by mouth daily. .  fluocinonide cream (LIDEX) 0.93 %, Apply 1 application topically daily as needed (psoriasis).  Marland Kitchen  omeprazole (PRILOSEC) 40 MG capsule, Take 40 mg by mouth daily.  .  traZODone (DESYREL) 50 MG tablet, Take 50 mg by mouth at bedtime. *For reference purposes while preparing patient  instructions.   Delete this med list prior to printing instructions for patient.*  HOLD Lasix and Spironolactone the morning of your procedure     On the morning of your procedure, take your Aspirin and any morning medicines NOT listed above.  You may use sips of water.  5. Plan for one night stay--bring personal belongings. 6. Bring a current list of your medications and current  insurance cards. 7. You MUST have a responsible person to drive you home. 8. Someone MUST be with you the first 24 hours after you arrive home or your discharge will be delayed. 9. Please wear clothes that are easy to get on and off and wear slip-on shoes.  Thank you for allowing [place] to care for you!   -- [facility]

## 2018-07-07 ENCOUNTER — Ambulatory Visit
Admission: RE | Admit: 2018-07-07 | Discharge: 2018-07-07 | Disposition: A | Discharge status: Home / Self Care | Payer: No Typology Code available for payment source | Source: Ambulatory Visit | Attending: Otolaryngology | Admitting: Otolaryngology

## 2018-07-07 DIAGNOSIS — K219 Gastro-esophageal reflux disease without esophagitis: Secondary | ICD-10-CM

## 2018-07-07 DIAGNOSIS — Z85850 Personal history of malignant neoplasm of thyroid: Secondary | ICD-10-CM

## 2018-07-08 ENCOUNTER — Telehealth: Payer: Self-pay | Admitting: *Deleted

## 2018-07-08 NOTE — Telephone Encounter (Signed)
Patient called and reminded to get labs done prior to her procedure on Wednesday 07/20/18. She verbalized her understanding.

## 2018-07-18 ENCOUNTER — Telehealth: Payer: Self-pay | Admitting: *Deleted

## 2018-07-18 NOTE — Telephone Encounter (Signed)
Patient called and reminded that we needed lab work for her procedure on Wednesday. She stated that she needed to cancel the procedure due to insurance. Once this was straightened out she would call back to reschedule.

## 2018-07-20 ENCOUNTER — Ambulatory Visit (HOSPITAL_COMMUNITY): Admit: 2018-07-20 | Payer: Self-pay | Admitting: Cardiovascular Disease

## 2018-07-20 ENCOUNTER — Encounter (HOSPITAL_COMMUNITY): Payer: Self-pay

## 2018-07-20 SURGERY — ABDOMINAL AORTOGRAM W/LOWER EXTREMITY
Anesthesia: LOCAL

## 2018-08-05 ENCOUNTER — Telehealth: Payer: Self-pay | Admitting: *Deleted

## 2018-08-05 NOTE — Telephone Encounter (Signed)
Called patient.  She begin to talk about having "TIAs" recently. Her last one was last week. She notes she did not mention it to Dr Fletcher Anon at her last appointment on 07/05/18.  She says she gets a severe headache before it happens, along with slurred speech, weakness and uncontrollable crying.  It is similar to what happened to her in December. She would like to keep the appointment with Dr Fletcher Anon to discuss these symptoms and can pay for that appointment.  Pt verbalized understanding to call 911 or go to the emergency room, if he develops any new or worsening symptoms.

## 2018-08-05 NOTE — Telephone Encounter (Signed)
-----   Message from Wellington Hampshire, MD sent at 08/05/2018  8:19 AM EDT ----- Cancel appointment on Tuesday.  ----- Message ----- From: Ricci Barker, RN Sent: 08/04/2018   8:29 PM EDT To: Wellington Hampshire, MD  She has an appt with you on Tuesday at [facility]. It was supposed to be a post angio follow up. Do you still want to see her? She may not come.  She cancelled the procedure due to insurance and said she would call back when it was settled.

## 2018-08-09 ENCOUNTER — Encounter: Payer: Self-pay | Admitting: Cardiovascular Disease

## 2018-08-09 ENCOUNTER — Ambulatory Visit (INDEPENDENT_AMBULATORY_CARE_PROVIDER_SITE_OTHER): Payer: Self-pay | Admitting: Cardiovascular Disease

## 2018-08-09 VITALS — BP 128/92 | HR 89 | Ht 68.0 in | Wt 274.4 lb

## 2018-08-09 DIAGNOSIS — I25118 Atherosclerotic heart disease of native coronary artery with other forms of angina pectoris: Secondary | ICD-10-CM

## 2018-08-09 DIAGNOSIS — I701 Atherosclerosis of renal artery: Secondary | ICD-10-CM

## 2018-08-09 DIAGNOSIS — E782 Mixed hyperlipidemia: Secondary | ICD-10-CM

## 2018-08-09 DIAGNOSIS — I5032 Chronic diastolic (congestive) heart failure: Secondary | ICD-10-CM

## 2018-08-09 NOTE — Patient Instructions (Signed)
Medication Instructions:  Your physician recommends that you continue on your current medications as directed. Please refer to the Current Medication list given to you today. If you need a refill on your cardiac medications before your next appointment, please call your pharmacy.   Follow-Up: Call office when you are ready for follow up with Dr. Fletcher Anon

## 2018-08-09 NOTE — Progress Notes (Signed)
Cardiology Office Note   Date:  08/09/2018   ID:  SADE HOLLON, DOB Mar 26, 1956, MRN [id number]  PCP:  [facility]  Cardiologist:   [name], MD   No chief complaint on file.     History of Present Illness: Kathleen [name] is a 62 y.o. female who presents for a follow-up visit regarding coronary artery disease, chronic diastolic heart failure and refractory hypertension likely due to renal artery stenosis.  Other medical problems include hyperlipidemia, previous tobacco use, possible COPD, hypothyroidism, GERD, morbid obesity and sleep apnea.  She had worsening heart failure in March of this year. A right and left cardiac catheterization showed severe one-vessel coronary artery disease with chronically occluded mid left circumflex with very faint collaterals from the LAD.  Ejection fraction was normal.  Right heart catheterization showed moderately elevated filling pressures, moderate pulmonary hypertension and mildly reduced cardiac output.  Systemic blood pressure was severely elevated during cardiac catheterization. She was diagnosed with sleep apnea and started using CPAP.   She underwent renal artery duplex in June which showed evidence of severe right renal artery stenosis with a peak velocity of 390 and RAR of 5.2.  Kidney size was 12 cm on the right and 11 cm on the left. I scheduled her for renal artery angiography and possible stenting.  However, she canceled the appointment due to not being able to obtain health insurance. She is supposed to start the jail sentence later this month for tax related issues. Blood pressure tends to run high at home.  She usually gets substernal chest tightness when her blood pressure is high.   Past Medical History:  Diagnosis Date  . Arthritis   . Cancer ([place])   . Chronic back pain   . Complication of anesthesia    Woke up during  Hysterectomy  . Coronary artery disease 2012   Mild to Moderate  . Family history of  adverse reaction to anesthesia   . GERD (gastroesophageal reflux disease)    omeprazole prn  . Heart murmur    "nothing to be concerned about  . History of blood transfusion   . Hx of thyroid cancer   . Hyperlipidemia   . Hypertension   . Hypothyroidism   . Obesity   . PONV (postoperative nausea and vomiting)    with thyroid surgery  . Shortness of breath dyspnea    with exertion  . Sleep apnea, obstructive    possible  . Tobacco abuse     Past Surgical History:  Procedure Laterality Date  . ABDOMINAL HYSTERECTOMY  1999  . CARDIAC CATHETERIZATION  Feb 2012   LAD: 60% mid (FFR ratio 0.83), LCX: 30-40%, RCA 20%.   Marland Kitchen KNEE ARTHROPLASTY Right 05/01/2016   Procedure:  TOTAL KNEE ARTHROPLASTY;  Surgeon: Marybelle Killings, MD;  Location: [facility];  Service: Orthopedics;  Laterality: Right;  . RIGHT/LEFT HEART CATH AND CORONARY ANGIOGRAPHY Bilateral 01/17/2018   Procedure: RIGHT/LEFT HEART CATH AND CORONARY ANGIOGRAPHY;  Surgeon: Wellington Hampshire, MD;  Location: [facility];  Service: Cardiovascular;  Laterality: Bilateral;  . THYROIDECTOMY  2000     Current Outpatient Medications  Medication Sig Dispense Refill  . amLODipine (NORVASC) 10 MG tablet TAKE 1 TABLET BY MOUTH ONCE DAILY 30 tablet 5  . aspirin 81 MG EC tablet Take 81 mg by mouth daily.  0  . atorvastatin (LIPITOR) 40 MG tablet Take 1 tablet (40 mg total) by mouth at bedtime. 90 tablet 3  .  budesonide-formoterol (SYMBICORT) 160-4.5 MCG/ACT inhaler Inhale 2 puffs into the lungs daily. Rinse mouth after use. 1 Inhaler 12  . busPIRone (BUSPAR) 15 MG tablet Take 1 tablet by mouth daily.  10  . carvedilol (COREG) 12.5 MG tablet Take 2 tablets (25 mg total) by mouth 2 (two) times daily. 60 tablet 6  . cloNIDine (CATAPRES) 0.1 MG tablet Take 1 tablet (0.1 mg total) by mouth 2 (two) times daily. 60 tablet 11  . fluocinonide cream (LIDEX) 6.19 % Apply 1 application topically daily as needed (psoriasis).     . furosemide (LASIX) 40  MG tablet Take 1 tablet (40 mg total) by mouth daily. 30 tablet   . levothyroxine (SYNTHROID, LEVOTHROID) 200 MCG tablet Take 200 mcg by mouth daily before breakfast.    . lisinopril (PRINIVIL,ZESTRIL) 40 MG tablet Take 40 mg by mouth daily.    . naproxen sodium (ANAPROX) 220 MG tablet Take 440 mg by mouth 2 (two) times daily as needed (for pain or headaches).     Marland Kitchen omeprazole (PRILOSEC) 40 MG capsule Take 40 mg by mouth 2 (two) times daily.     Marland Kitchen spironolactone (ALDACTONE) 25 MG tablet Take 1 tablet (25 mg total) by mouth daily. 90 tablet 3  . traMADol (ULTRAM) 50 MG tablet Take 50 mg by mouth every 6 (six) hours as needed for moderate pain.   0  . traZODone (DESYREL) 100 MG tablet Take 100 mg by mouth at bedtime.     No current facility-administered medications for this visit.     Allergies:   Hydrocodone-acetaminophen; Latex; Loracarbef; and Penicillins    Social History:  The patient  reports that she has quit smoking. Her smoking use included cigarettes. She has a 5.00 pack-year smoking history. She has never used smokeless tobacco. She reports that she does not drink alcohol or use drugs.   Family History:  The patient's family history includes Diabetes in her mother.    ROS:  Please see the history of present illness.   Otherwise, review of systems are positive for none.   All other systems are reviewed and negative.    PHYSICAL EXAM: VS:  BP (!) 128/92   Pulse 89   Ht 5\' 8"  (1.727 m)   Wt 274 lb 6.4 oz (124.5 kg)   BMI 41.72 kg/m  , BMI Body mass index is 41.72 kg/m. GEN: Well nourished, well developed, in no acute distress  HEENT: normal  Neck: no JVD, carotid bruits, or masses Cardiac: RRR; no murmurs, rubs, or gallops,no edema  Respiratory:  clear to auscultation bilaterally, normal work of breathing GI: soft, nontender, nondistended, + BS MS: no deformity or atrophy  Skin: warm and dry, no rash Neuro:  Strength and sensation are intact Psych: euthymic mood, full  affect   EKG:  EKG is not ordered today.    Recent Labs: 01/10/2018: Hemoglobin 15.2; Platelets 253 04/05/2018: BUN 12; Creatinine, Ser 0.76; Potassium 4.4; Sodium 141    Lipid Panel No results found for: CHOL, TRIG, HDL, CHOLHDL, VLDL, LDLCALC, LDLDIRECT    Wt Readings from Last 3 Encounters:  08/09/18 274 lb 6.4 oz (124.5 kg)  07/05/18 281 lb 9.6 oz (127.7 kg)  04/05/18 281 lb 6.4 oz (127.6 kg)       No flowsheet data found.    ASSESSMENT AND PLAN:  1.  Chronic diastolic heart failure due to hypertensive heart disease: She appears to be euvolemic on current dose of furosemide.  2.  Coronary artery disease involving  native coronary arteries with other forms of angina: She has a chronically occluded left circumflex with faint collaterals.  Continue blood pressure control and carvedilol.  We can consider adding long-acting nitroglycerin in the future.  3.  Renovascular hypertension: There is an indication for renal artery stenting given extremely elevated blood pressure in spite of 6 different blood pressure medications.  The patient could not keep her appointment due to her lack of health insurance and wants to wait for now.  4.  Hyperlipidemia: Continue treatment with atorvastatin.  Target LDL is less than 70.    Disposition:   FU with me in 6 months  Signed,  [name], MD  08/09/2018 10:16 AM    [place]

## 2019-09-27 IMAGING — CT CT ANGIO CHEST-ABD-PELV FOR DISSECTION W/ AND WO/W CM
2 of 7 series · 14 of 46 positions shown, 16 images · IV contrast (APPLIED)
Comparison: Chest radiograph dated 01/10/2018 and CT dated
03/28/2015

CLINICAL DATA: 61-year-old female with chest pain radiating to arm
and back. Concern for dissection.

EXAM:
CT ANGIOGRAPHY CHEST, ABDOMEN AND PELVIS
TECHNIQUE: Multidetector CT imaging through the chest, abdomen and pelvis was
performed using the standard protocol during bolus administration of
intravenous contrast. Multiplanar reconstructed images and MIPs were
obtained and reviewed to evaluate the vascular anatomy.
CONTRAST:  100mL 2IZH1Z-2MF IOPAMIDOL (2IZH1Z-2MF) INJECTION 76%

[Series 7: arterial · axial · arterial · 0.94mm/px · z∈[+837,+1417]mm · 11 of 328 slices shown, 13 images]
[im 19/328  soft-tissue]
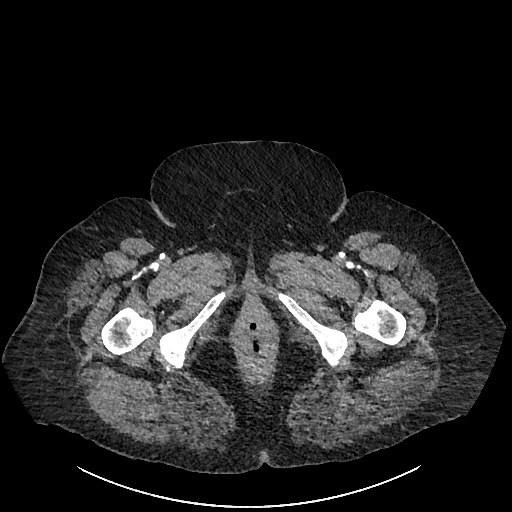
[im 19/328  bone]
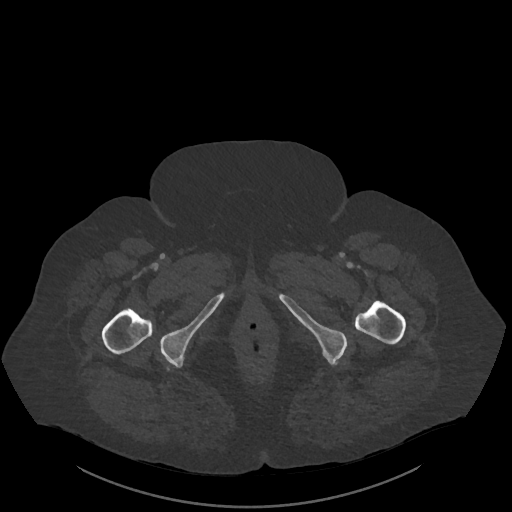
[im 55/328  soft-tissue]
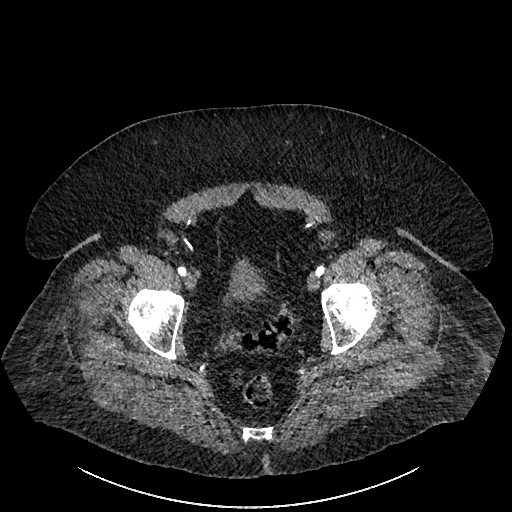
[im 73/328  soft-tissue]
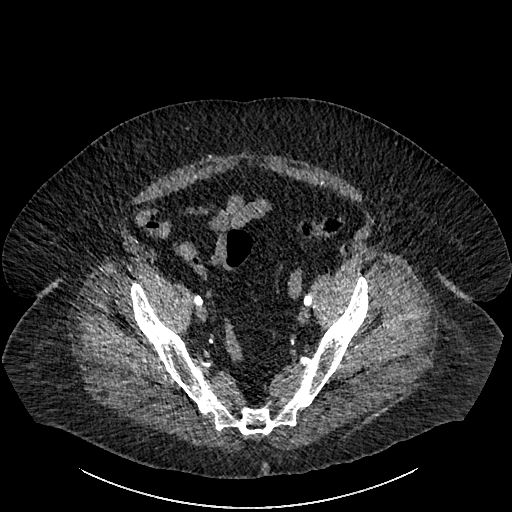
[im 110/328  soft-tissue]
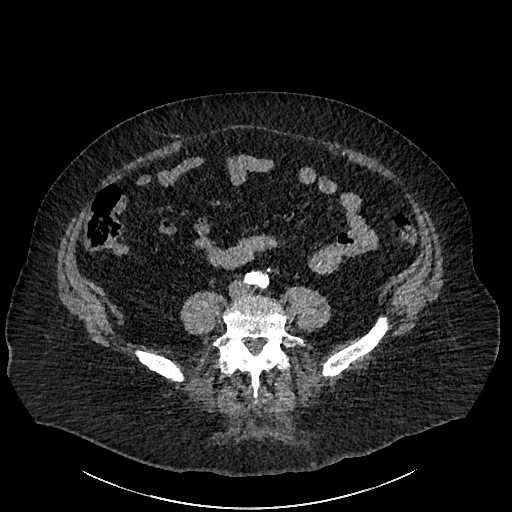
[im 128/328  soft-tissue]
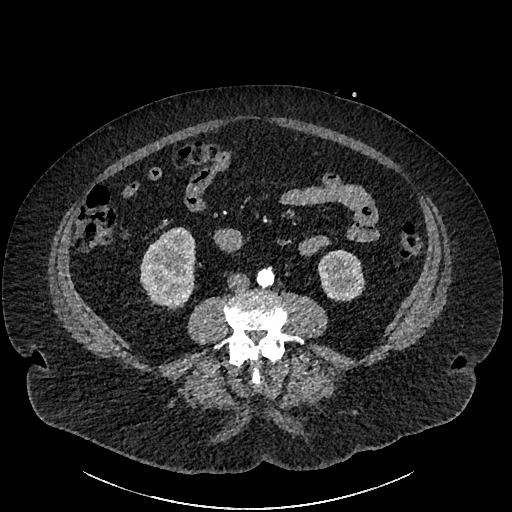
[im 164/328  soft-tissue]
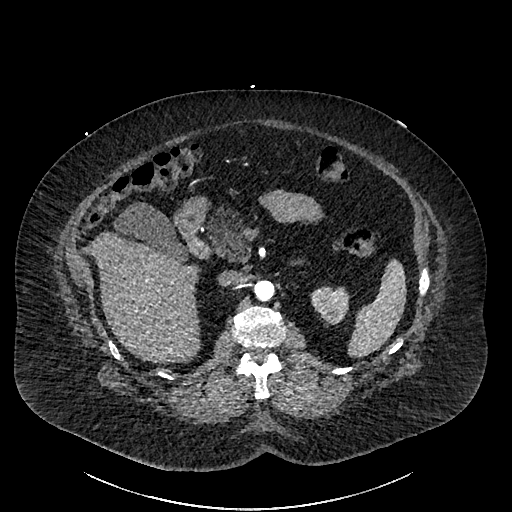
[im 200/328  soft-tissue]
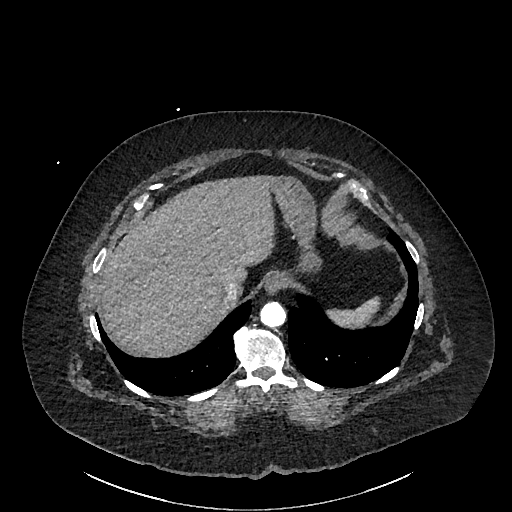
[im 219/328  soft-tissue]
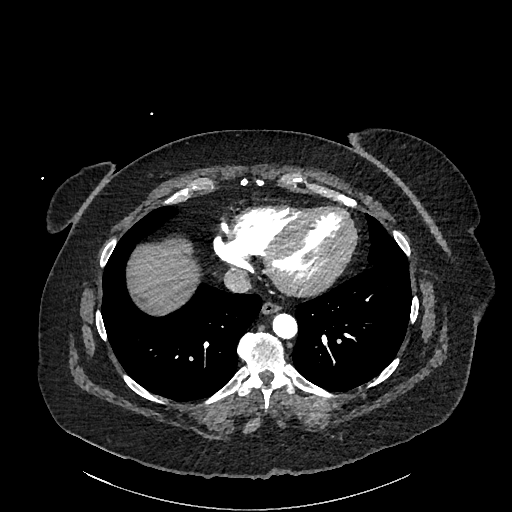
[im 255/328  soft-tissue]
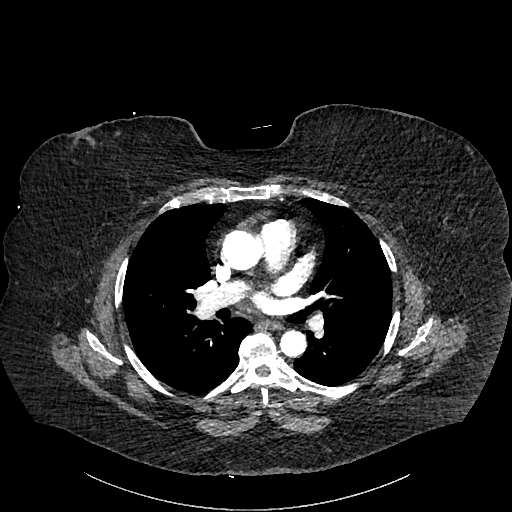
[im 255/328  bone]
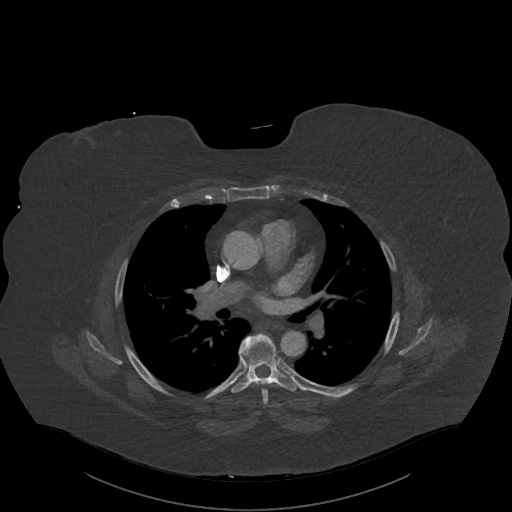
[im 273/328  soft-tissue]
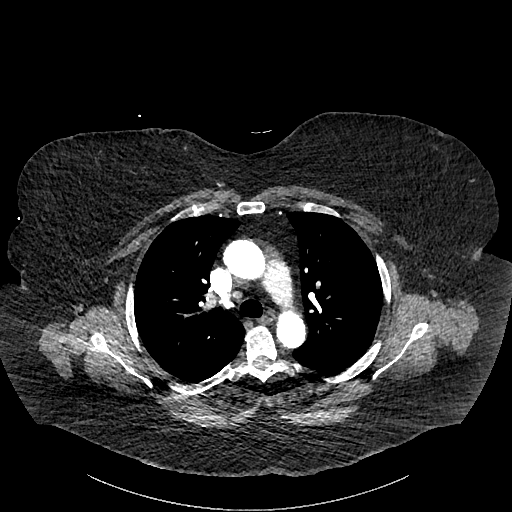
[im 309/328  soft-tissue]
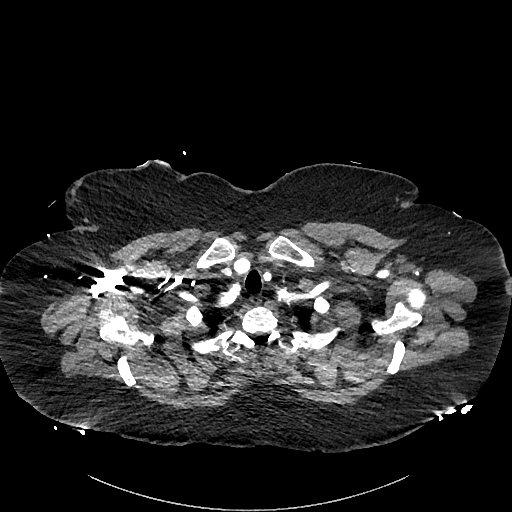

[Series 10: cor · coronal · 0.96mm/px · 3 of 174 slices shown]
[im 44/174  soft-tissue]
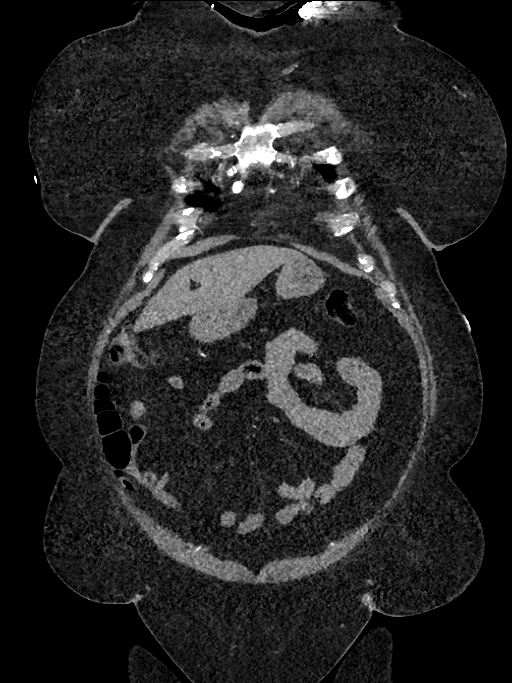
[im 87/174  soft-tissue]
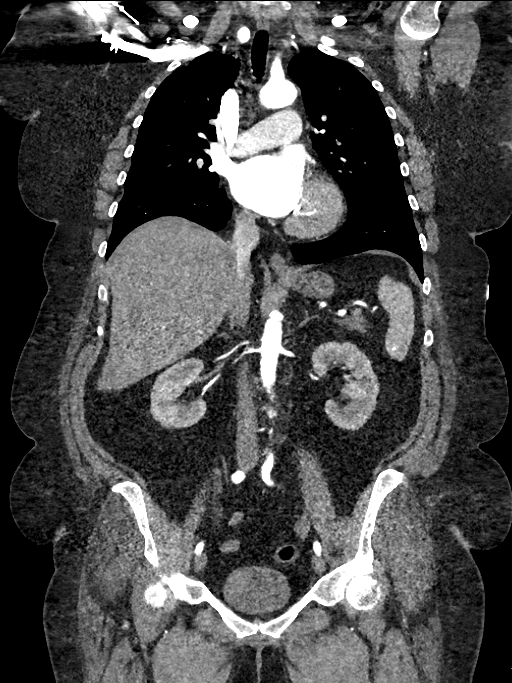
[im 130/174  soft-tissue]
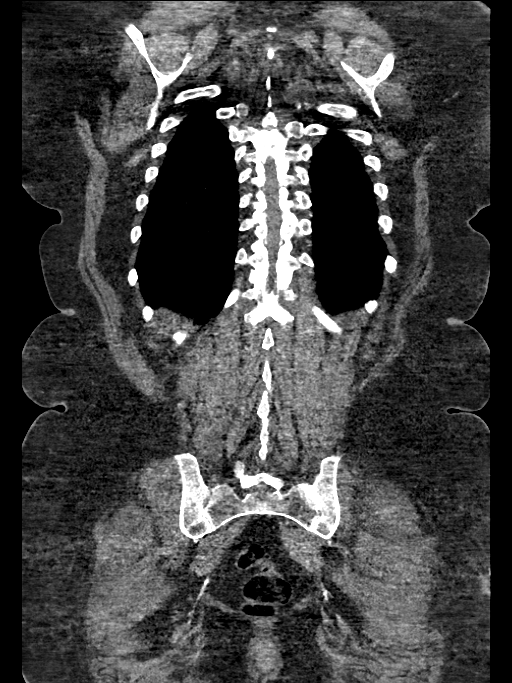

[14 of 46 positions shown; findings below may reference images not displayed]

FINDINGS: CTA CHEST FINDINGS

Cardiovascular: There is no cardiomegaly or pericardial effusion.
Partial calcification of the mitral annulus. The thoracic aorta is
unremarkable. No aneurysmal dilatation or dissection. The origins of
the great vessels of the aortic arch are patent. The central
pulmonary arteries are grossly unremarkable for the degree of
enhancement.

Mediastinum/Nodes: There is no hilar or mediastinal adenopathy.
Esophagus is grossly unremarkable. No mediastinal fluid collection.

Lungs/Pleura: Stable 13 mm calcified right apical granuloma. A 3 mm
nodule in the left lung base (series 8, image 123) stable compared
study of 3872. A 3 mm calcified right upper lobe granuloma. There is
a 4 mm nodule in the right lower lobe (series 8, image 97), new
compared to the study of 3872. There is no focal consolidation,
pleural effusion, or pneumothorax. The central airways are patent.

Musculoskeletal: There is degenerative changes of the spine. No
acute osseous pathology.

Review of the MIP images confirms the above findings.

CTA ABDOMEN AND PELVIS FINDINGS

VASCULAR

Aorta: Mild atherosclerotic calcification. No aneurysmal dilatation
or dissection.

Celiac: Patent without evidence of aneurysm, dissection, vasculitis
or significant stenosis.

SMA: Patent without evidence of aneurysm, dissection, vasculitis or
significant stenosis.

Renals: Both renal arteries are patent without evidence of aneurysm,
dissection, vasculitis, fibromuscular dysplasia or significant
stenosis.

IMA: Patent without evidence of aneurysm, dissection, vasculitis or
significant stenosis.

Inflow: Patent without evidence of aneurysm, dissection, vasculitis
or significant stenosis.

Veins: No obvious venous abnormality within the limitations of this
arterial phase study.

Review of the MIP images confirms the above findings.

NON-VASCULAR

No intra-abdominal free air or free fluid.

Hepatobiliary: No focal liver abnormality is seen. No gallstones,
gallbladder wall thickening, or biliary dilatation.

Pancreas: Unremarkable. No pancreatic ductal dilatation or
surrounding inflammatory changes.

Spleen: Normal in size without focal abnormality.

Adrenals/Urinary Tract: The adrenal glands are unremarkable. There
is a 3 mm nonobstructing right renal interpolar stone. No
hydronephrosis. The left kidney is unremarkable. The visualized
ureters and urinary bladder are unremarkable.

Stomach/Bowel: There is sigmoid diverticulosis without active
inflammatory changes. No bowel obstruction. Normal appendix.

Lymphatic: No adenopathy.

Reproductive: Hysterectomy.  No pelvic mass.

Other: None

Musculoskeletal: No acute or significant osseous findings.

Review of the MIP images confirms the above findings.
IMPRESSION: 1. No acute intrathoracic, abdominal, or pelvic pathology. No CT
evidence of aortic dissection or aneurysm.
2. Small scattered pulmonary nodules as described. A 4 mm nodule in
the right lower lobe is new compared to prior CT. No follow-up
needed if patient is low-risk. Non-contrast chest CT can be
considered in 12 months if patient is high-risk. This recommendation
follows the consensus statement: Guidelines for Management of
Incidental Pulmonary Nodules Detected on CT Images: From the
3. A 3 mm nonobstructing right renal stone.  No hydronephrosis.

## 2019-09-27 IMAGING — DX DG CHEST 2V
2 series · 2 of 2 positions shown · non-contrast
Comparison: Radiographs April 17, 2016.

CLINICAL DATA: Chest pain.

EXAM:
CHEST - 2 VIEW

[w chest pa]
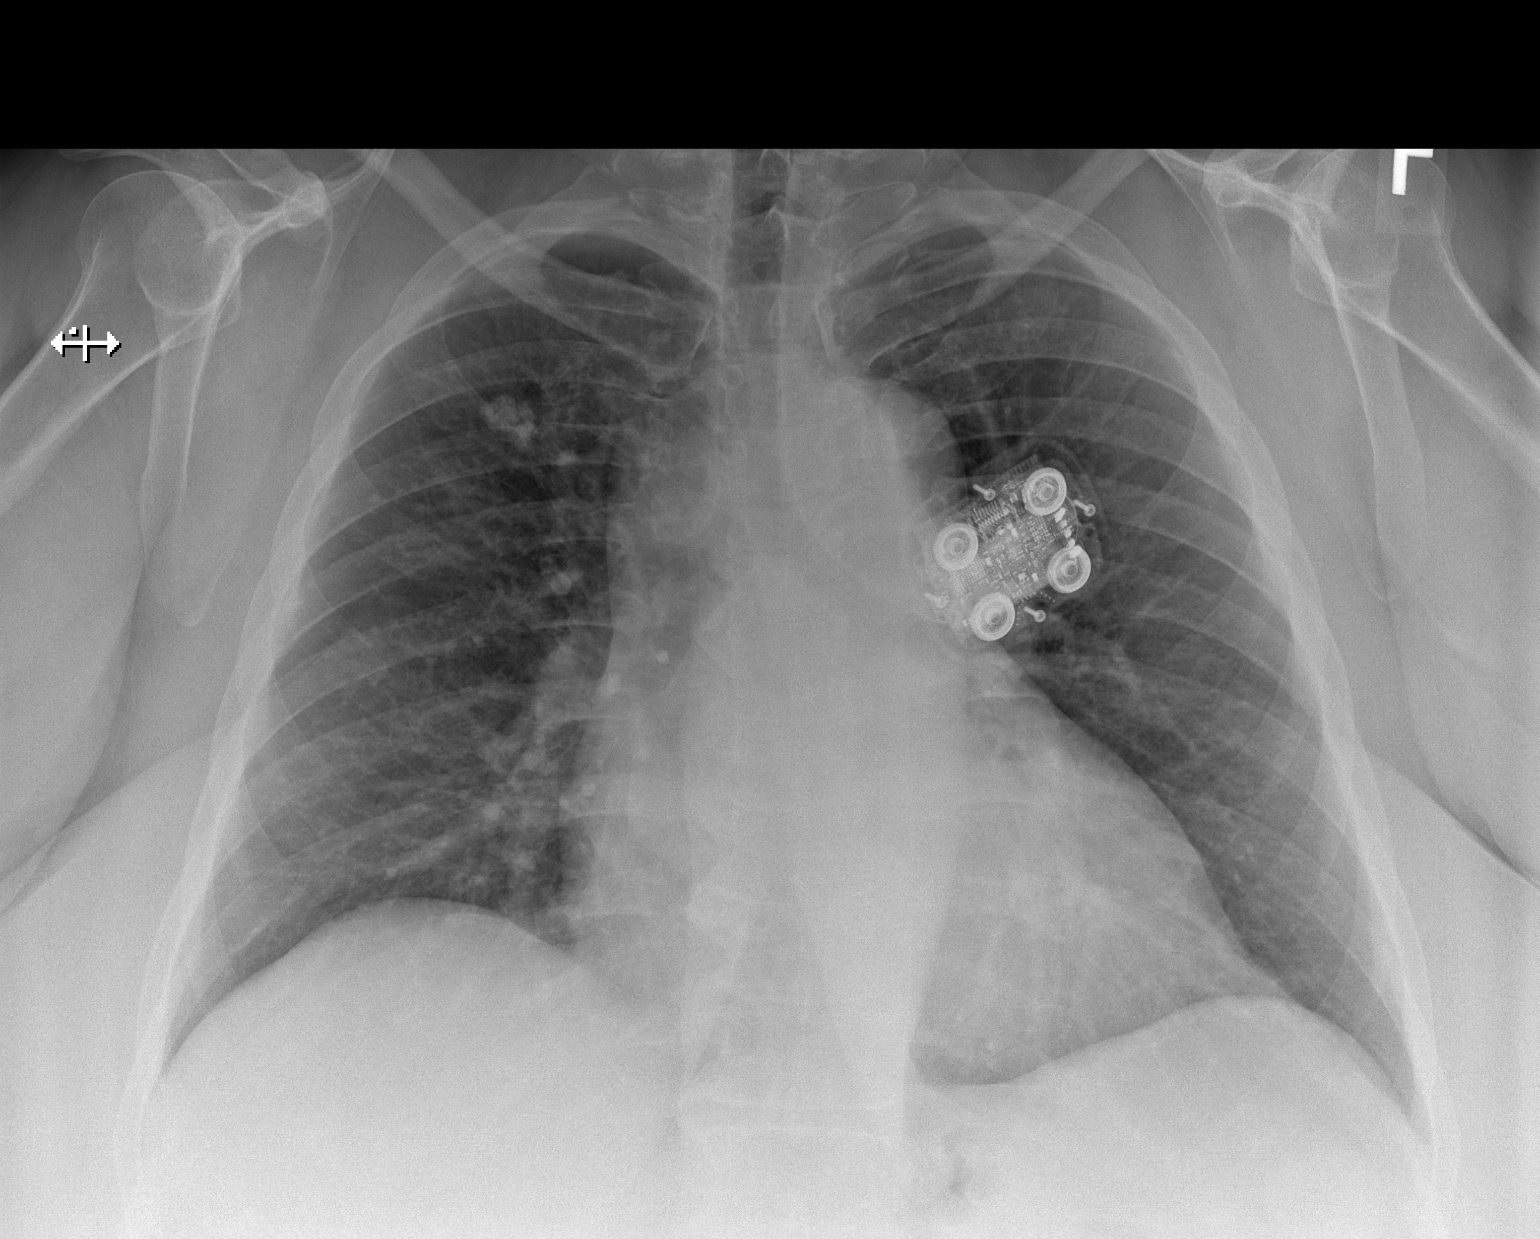

[w chest lat]
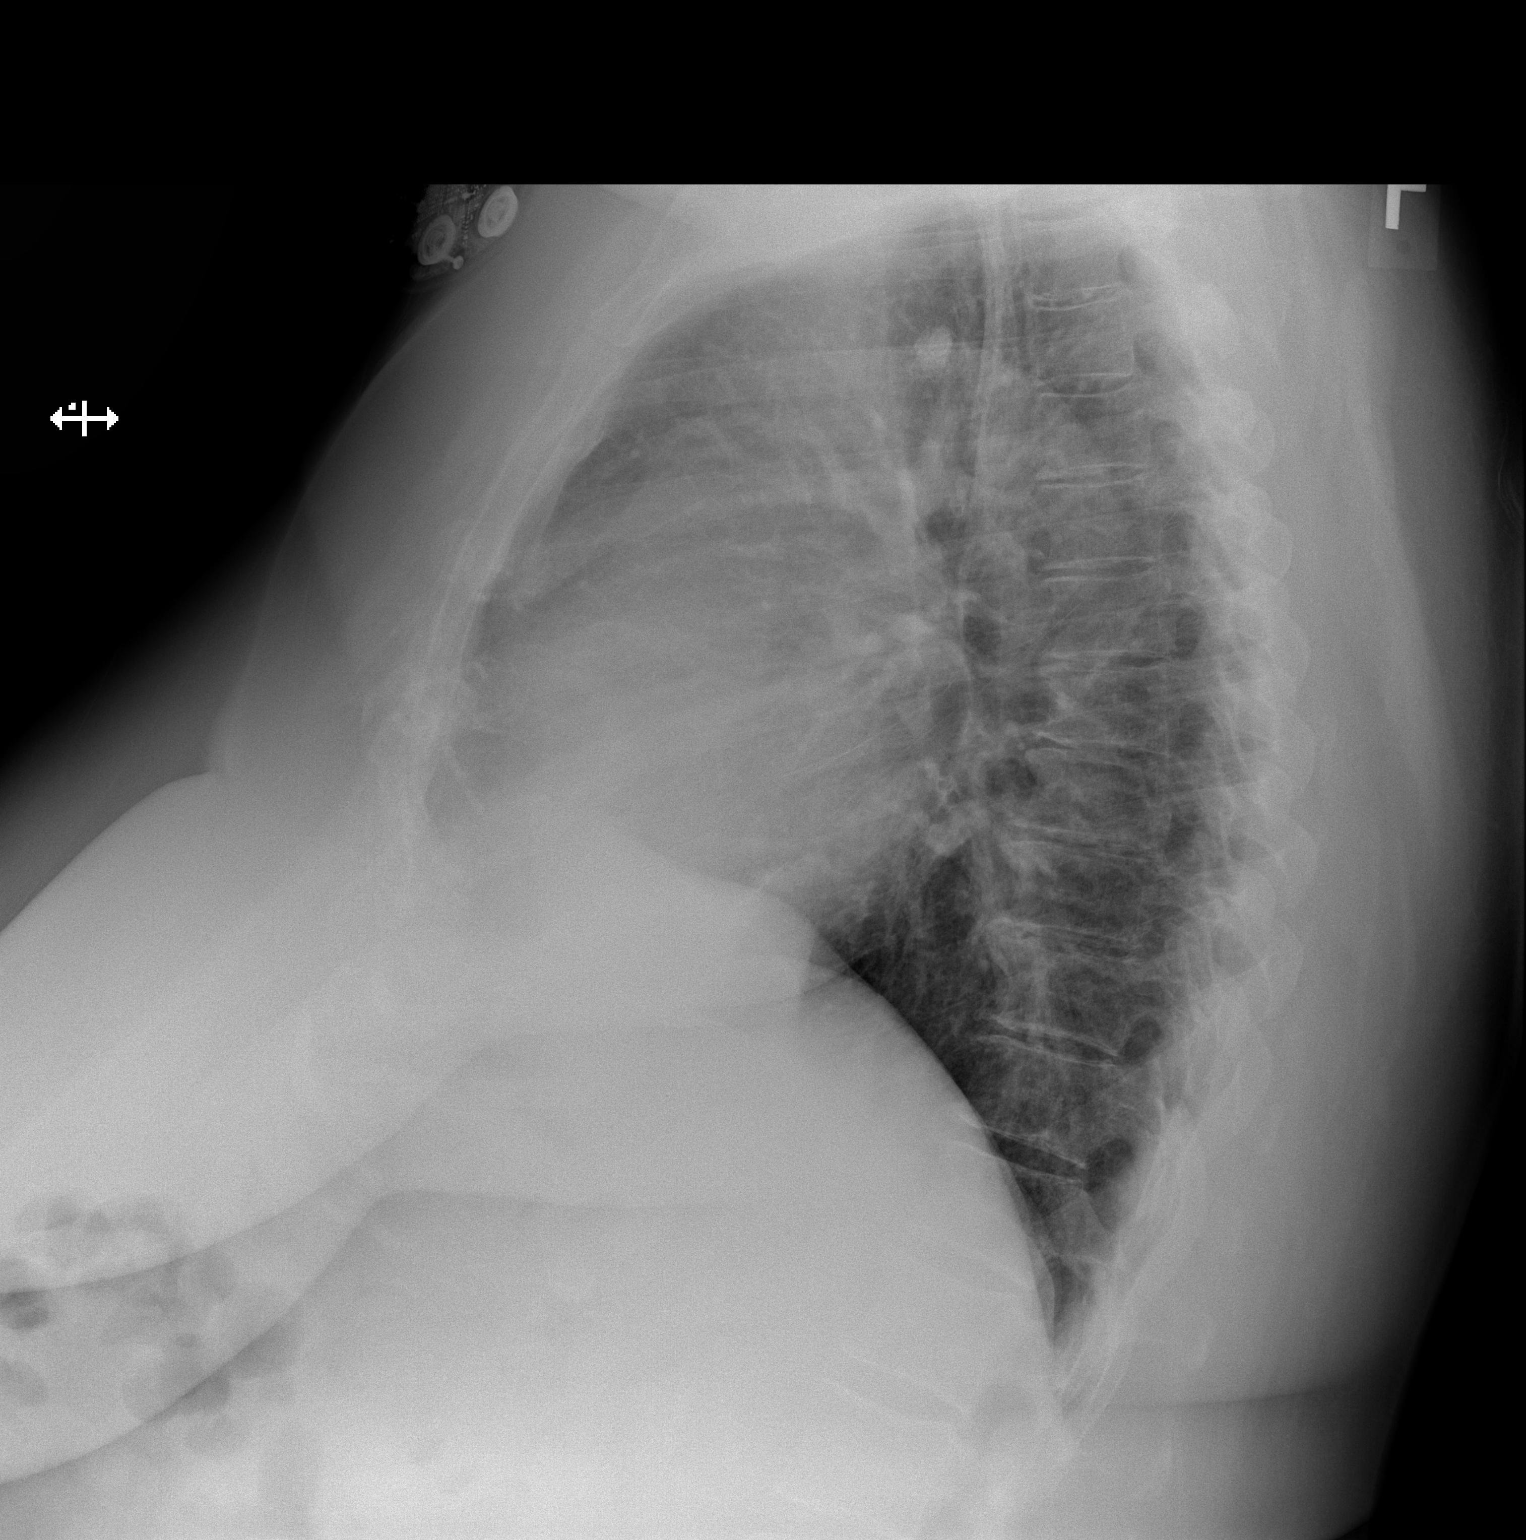

[2 of 2 positions shown; findings below may reference images not displayed]

FINDINGS: The heart size and mediastinal contours are within normal limits. No
pneumothorax or pleural effusion is noted. Stable calcified
granuloma noted in right upper lobe. No acute pulmonary disease is
noted. The visualized skeletal structures are unremarkable.
IMPRESSION: No active cardiopulmonary disease.

## 2019-11-16 DIAGNOSIS — H25813 Combined forms of age-related cataract, bilateral: Secondary | ICD-10-CM | POA: Diagnosis not present

## 2019-12-28 ENCOUNTER — Encounter: Payer: Self-pay | Admitting: Physician Assistant

## 2019-12-28 ENCOUNTER — Ambulatory Visit (INDEPENDENT_AMBULATORY_CARE_PROVIDER_SITE_OTHER): Payer: BC Managed Care – PPO | Admitting: Physician Assistant

## 2019-12-28 ENCOUNTER — Other Ambulatory Visit: Payer: Self-pay

## 2019-12-28 VITALS — BP 128/82 | HR 65 | Temp 97.2°F | Resp 16 | Ht 68.0 in | Wt 275.0 lb

## 2019-12-28 DIAGNOSIS — M25561 Pain in right knee: Secondary | ICD-10-CM

## 2019-12-28 DIAGNOSIS — G8929 Other chronic pain: Secondary | ICD-10-CM

## 2019-12-28 DIAGNOSIS — Z87828 Personal history of other (healed) physical injury and trauma: Secondary | ICD-10-CM

## 2019-12-28 DIAGNOSIS — Z1231 Encounter for screening mammogram for malignant neoplasm of breast: Secondary | ICD-10-CM | POA: Diagnosis not present

## 2019-12-28 DIAGNOSIS — K219 Gastro-esophageal reflux disease without esophagitis: Secondary | ICD-10-CM

## 2019-12-28 DIAGNOSIS — I10 Essential (primary) hypertension: Secondary | ICD-10-CM

## 2019-12-28 DIAGNOSIS — Z1211 Encounter for screening for malignant neoplasm of colon: Secondary | ICD-10-CM | POA: Diagnosis not present

## 2019-12-28 DIAGNOSIS — G44319 Acute post-traumatic headache, not intractable: Secondary | ICD-10-CM

## 2019-12-28 DIAGNOSIS — R519 Headache, unspecified: Secondary | ICD-10-CM | POA: Insufficient documentation

## 2019-12-28 DIAGNOSIS — E782 Mixed hyperlipidemia: Secondary | ICD-10-CM

## 2019-12-28 DIAGNOSIS — F419 Anxiety disorder, unspecified: Secondary | ICD-10-CM

## 2019-12-28 HISTORY — DX: Morbid (severe) obesity due to excess calories: E66.01

## 2019-12-28 HISTORY — DX: Anxiety disorder, unspecified: F41.9

## 2019-12-28 HISTORY — DX: Gastro-esophageal reflux disease without esophagitis: K21.9

## 2019-12-28 HISTORY — DX: Personal history of other (healed) physical injury and trauma: Z87.828

## 2019-12-28 HISTORY — DX: Headache, unspecified: R51.9

## 2019-12-28 HISTORY — DX: Other chronic pain: G89.29

## 2019-12-28 MED ORDER — SERTRALINE HCL 100 MG PO TABS: 100.0000 mg | ORAL_TABLET | Freq: Two times a day (BID) | ORAL | 2 refills | Status: DC

## 2019-12-28 MED ORDER — TRAMADOL HCL ER 100 MG PO TB24: 100.0000 mg | ORAL_TABLET | Freq: Every day | ORAL | 0 refills | Status: DC | PRN

## 2019-12-28 MED ORDER — OXYBUTYNIN CHLORIDE ER 10 MG PO TB24: 10.0000 mg | ORAL_TABLET | Freq: Every day | ORAL | 2 refills | Status: DC

## 2019-12-28 MED ORDER — OMEPRAZOLE 40 MG PO CPDR: 40.0000 mg | DELAYED_RELEASE_CAPSULE | Freq: Two times a day (BID) | ORAL | 2 refills | Status: DC

## 2019-12-28 MED ORDER — NALTREXONE-BUPROPION HCL ER 8-90 MG PO TB12: ORAL_TABLET | ORAL | 0 refills | Status: DC

## 2019-12-28 NOTE — Progress Notes (Signed)
Established Patient Office Visit  Subjective:  Patient ID: Kathleen [name], female    DOB: 1956-09-03  Age: 64 y.o. MRN: [id number]  CC:  Chief Complaint  Patient presents with  . Follow-up  . Hypothyroidism  . Hypertension  . COPD    HPI Kathleen [name] presents for follow up of chronic medical problems - pt has not been seen by a provider for over a year or no labwork in few years - has been getting meds however  Pt with history of hypertension and CHF - she is currently on coreg 25 bid, furosemide 40mg  qd, lisinopril 40mg  qd, amlodipine 10 mg qd - she follows with Dr Fletcher Anon but as stated has not seen him in well over a year - states she will call and make follow up appt - denies any symptoms with no chest pain, no dyspnea, no edema  Pt with history of morbid obesity - she has been on adipex in the past but not currently - she actually would like to try contrave to see if that would help better with her appetite and efforts at weight loss  Pt with history of COPD - currently she has no symptoms and is using symbicort as directed  Pt with history of hypothyroidism - she is taking synthroid 261mcg qd but has not had labwork in over a year  Pt has history of chronic pain in her right knee after having knee replacement surgery years ago - states she has been prescribed tramadol in the past for her pain and requests refill - says only uses med sporadically  Pt requests refill of omeprazole 40mg  which she takes bid for GERD - states this medication controls symptoms well  Pt has history of anxiety with mild depression - she has been prescribed zoloft 100mg  2 po qd and has been on this dose for several years - voice no problems or concerns and would like refill of medication  Pt states on 10/08/2019 she had a bad fall hitting her head on concrete - since that time she has had intermittent headaches and significant tenderness and pain at site where she hit her head - has been advised by  another MD to obtain CT scan for further evaluation She denies any other neurological problems or defecits  Past Medical History:  Diagnosis Date  . Arthritis   . Cancer ([place])   . Chronic back pain   . Complication of anesthesia    Woke up during  Hysterectomy  . Coronary artery disease 2012   Mild to Moderate  . Family history of adverse reaction to anesthesia   . GERD (gastroesophageal reflux disease)    omeprazole prn  . Heart murmur    "nothing to be concerned about  . History of blood transfusion   . Hx of thyroid cancer   . Hyperlipidemia   . Hypertension   . Hypothyroidism   . Obesity   . PONV (postoperative nausea and vomiting)    with thyroid surgery  . Shortness of breath dyspnea    with exertion  . Sleep apnea, obstructive    possible  . Tobacco abuse     Past Surgical History:  Procedure Laterality Date  . ABDOMINAL HYSTERECTOMY  1999  . CARDIAC CATHETERIZATION  Feb 2012   LAD: 60% mid (FFR ratio 0.83), LCX: 30-40%, RCA 20%.   Marland Kitchen KNEE ARTHROPLASTY Right 05/01/2016   Procedure:  TOTAL KNEE ARTHROPLASTY;  Surgeon: Marybelle Killings, MD;  Location: [facility];  Service: Orthopedics;  Laterality: Right;  . RIGHT/LEFT HEART CATH AND CORONARY ANGIOGRAPHY Bilateral 01/17/2018   Procedure: RIGHT/LEFT HEART CATH AND CORONARY ANGIOGRAPHY;  Surgeon: Wellington Hampshire, MD;  Location: [facility];  Service: Cardiovascular;  Laterality: Bilateral;  . THYROIDECTOMY  2000    Family History  Problem Relation Age of Onset  . Diabetes Mother     Social History   Socioeconomic History  . Marital status: Divorced    Spouse name: Not on file  . Number of children: Not on file  . Years of education: Not on file  . Highest education level: Not on file  Occupational History  . Not on file  Tobacco Use  . Smoking status: Former Smoker    Packs/day: 0.25    Years: 20.00    Pack years: 5.00    Types: Cigarettes  . Smokeless tobacco: Never Used  . Tobacco comment: quit  2012  Substance and Sexual Activity  . Alcohol use: No    Alcohol/week: 3.0 standard drinks    Types: 3 Glasses of wine per week  . Drug use: No  . Sexual activity: Not on file  Other Topics Concern  . Not on file  Social History Narrative  . Not on file   Social Determinants of Health   Financial Resource Strain:   . Difficulty of Paying Living Expenses: Not on file  Food Insecurity:   . Worried About Charity fundraiser in the Last Year: Not on file  . Ran Out of Food in the Last Year: Not on file  Transportation Needs:   . Lack of Transportation (Medical): Not on file  . Lack of Transportation (Non-Medical): Not on file  Physical Activity:   . Days of Exercise per Week: Not on file  . Minutes of Exercise per Session: Not on file  Stress:   . Feeling of Stress : Not on file  Social Connections:   . Frequency of Communication with Friends and Family: Not on file  . Frequency of Social Gatherings with Friends and Family: Not on file  . Attends Religious Services: Not on file  . Active Member of Clubs or Organizations: Not on file  . Attends Archivist Meetings: Not on file  . Marital Status: Not on file  Intimate Partner Violence:   . Fear of Current or Ex-Partner: Not on file  . Emotionally Abused: Not on file  . Physically Abused: Not on file  . Sexually Abused: Not on file     Current Outpatient Medications:  .  carvedilol (COREG) 25 MG tablet, Take 25 mg by mouth 2 (two) times daily with a meal., Disp: , Rfl:  .  oxybutynin (DITROPAN-XL) 10 MG 24 hr tablet, Take 1 tablet (10 mg total) by mouth at bedtime., Disp: 30 tablet, Rfl: 2 .  sertraline (ZOLOFT) 100 MG tablet, Take 1 tablet (100 mg total) by mouth in the morning and at bedtime., Disp: 60 tablet, Rfl: 2 .  traMADol (ULTRAM-ER) 100 MG 24 hr tablet, Take 1 tablet (100 mg total) by mouth daily as needed for pain., Disp: 30 tablet, Rfl: 0 .  amLODipine (NORVASC) 10 MG tablet, TAKE 1 TABLET BY MOUTH ONCE  DAILY, Disp: 30 tablet, Rfl: 5 .  aspirin 81 MG EC tablet, Take 81 mg by mouth daily., Disp: , Rfl: 0 .  budesonide-formoterol (SYMBICORT) 160-4.5 MCG/ACT inhaler, Inhale 2 puffs into the lungs daily. Rinse mouth after use., Disp: 1 Inhaler, Rfl: 12 .  busPIRone (BUSPAR) 15 MG tablet, Take 1 tablet by mouth daily. 1 po bid, Disp: , Rfl: 10 .  furosemide (LASIX) 40 MG tablet, Take 1 tablet (40 mg total) by mouth daily., Disp: 30 tablet, Rfl:  .  levothyroxine (SYNTHROID, LEVOTHROID) 200 MCG tablet, Take 200 mcg by mouth daily before breakfast., Disp: , Rfl:  .  lisinopril (PRINIVIL,ZESTRIL) 40 MG tablet, Take 40 mg by mouth daily., Disp: , Rfl:  .  Naltrexone-buPROPion HCl ER 8-90 MG TB12, Start 1 tablet every morning for 7 days, then 1 tablet twice daily for 7 days, then 2 tablets every morning and one in the evening, Disp: 120 tablet, Rfl: 0 .  omeprazole (PRILOSEC) 40 MG capsule, Take 1 capsule (40 mg total) by mouth 2 (two) times daily., Disp: 60 capsule, Rfl: 2   Allergies  Allergen Reactions  . Hydrocodone-Acetaminophen Other (See Comments)    Headache, allergy to hydrocodone Headache, allergy to hydrocodone   . Latex Hives  . Loracarbef Other (See Comments)  . Penicillins Other (See Comments)    Syncope Has patient had a PCN reaction causing immediate rash, facial/tongue/throat swelling, SOB or lightheadedness with hypotension: Unknown Has patient had a PCN reaction causing severe rash involving mucus membranes or skin necrosis: No Has patient had a PCN reaction that required hospitalization: No Has patient had a PCN reaction occurring within the last 10 years: Unknown If all of the above answers are "NO", then may proceed with Cephalosporin use.  Syncope Has patient had a PCN reaction causing immediate rash, facial/tongue/throat swelling, SOB or lightheadedness with hypotension: Unknown Has patient had a PCN reaction causing severe rash involving mucus membranes or skin necrosis:  No Has patient had a PCN reaction that required hospitalization: No Has patient had a PCN reaction occurring within the last 10 years: Unknown If all of the above answers are "NO", then may proceed with Cephalosporin use.  Marland Kitchen Hydrocodone-Acetaminophen Other (See Comments)    Headache, allergy to hydrocodone    ROS CONSTITUTIONAL: Negative for chills, fatigue, fever, unintentional weight gain and unintentional weight loss.  E/N/T: Negative for ear pain, nasal congestion and sore throat.  CARDIOVASCULAR: Negative for chest pain, dizziness, palpitations and pedal edema.  RESPIRATORY: Negative for recent cough and dyspnea.  GASTROINTESTINAL: Negative for abdominal pain, acid reflux symptoms, constipation, diarrhea, nausea and vomiting.  MSK: Negative for arthralgias and myalgias.  INTEGUMENTARY: Negative for rash.  NEUROLOGICAL: Negative for dizziness and headaches.  PSYCHIATRIC: Negative for sleep disturbance and to question depression screen.  Negative for depression, negative for anhedonia.        Objective:    PHYSICAL EXAM:   VS: BP 128/82   Pulse 65   Temp (!) 97.2 F (36.2 C)   Resp 16   Ht 5\' 8"  (1.727 m)   Wt 275 lb (124.7 kg)   SpO2 96%   BMI 41.81 kg/m    PHYSICAL EXAM:   VS: BP 128/82   Pulse 65   Temp (!) 97.2 F (36.2 C)   Resp 16   Ht 5\' 8"  (1.727 m)   Wt 275 lb (124.7 kg)   SpO2 96%   BMI 41.81 kg/m   GEN: Well nourished, well developed, in no acute distress   Cardiac: RRR; no murmurs, rubs, or gallops,no edema - no significant varicosities Respiratory:  normal respiratory rate and pattern with no distress - normal breath sounds with no rales, rhonchi, wheezes or rubs  MS: no deformity or atrophy  Skin: warm and dry,  no rash  Neuro:  Alert and Oriented x 3, Strength and sensation are intact - CN II-Xii grossly intact Psych: euthymic mood, appropriate affect and demeanor   BP 128/82   Pulse 65   Temp (!) 97.2 F (36.2 C)   Resp 16   Ht 5\' 8"   (1.727 m)   Wt 275 lb (124.7 kg)   SpO2 96%   BMI 41.81 kg/m  Wt Readings from Last 3 Encounters:  12/28/19 275 lb (124.7 kg)  08/09/18 274 lb 6.4 oz (124.5 kg)  07/05/18 281 lb 9.6 oz (127.7 kg)     Health Maintenance Due  Topic Date Due  . Hepatitis C Screening  [birth year]  . HIV Screening  06/05/1971  . MAMMOGRAM  06/04/2006    There are no preventive care reminders to display for this patient.  No results found for: TSH Lab Results  Component Value Date   WBC 9.1 01/10/2018   HGB 15.2 (H) 01/10/2018   HCT 46.8 (H) 01/10/2018   MCV 89.7 01/10/2018   PLT 253 01/10/2018   Lab Results  Component Value Date   NA 141 04/05/2018   K 4.4 04/05/2018   CO2 23 04/05/2018   GLUCOSE 83 04/05/2018   BUN 12 04/05/2018   CREATININE 0.76 04/05/2018   BILITOT 0.6 04/17/2016   ALKPHOS 83 04/17/2016   AST 23 04/17/2016   ALT 25 04/17/2016   PROT 6.6 04/17/2016   ALBUMIN 3.7 04/17/2016   CALCIUM 9.9 04/05/2018   ANIONGAP 9 01/10/2018   No results found for: CHOL No results found for: HDL No results found for: LDLCALC No results found for: TRIG No results found for: CHOLHDL No results found for: HGBA1C    Assessment & Plan:   Problem List Items Addressed This Visit      Cardiovascular and Mediastinum   Hypertension - Primary   Relevant Medications   carvedilol (COREG) 25 MG tablet   Other Relevant Orders   CBC with Differential/Platelet   Comprehensive metabolic panel   TSH   VITAMIN D 25 Hydroxy (Vit-D Deficiency, Fractures)   Hemoglobin A1c     Digestive   GERD without esophagitis   Relevant Medications   omeprazole (PRILOSEC) 40 MG capsule     Other   Mixed hyperlipidemia   Relevant Medications   carvedilol (COREG) 25 MG tablet   Other Relevant Orders   Lipid panel   Hemoglobin A1c   Visit for screening mammogram   Relevant Orders   MM Digital Screening   Morbid obesity ([place])   Relevant Medications   Naltrexone-buPROPion HCl ER 8-90 MG TB12    Chronic pain of right knee   Relevant Medications   sertraline (ZOLOFT) 100 MG tablet   traMADol (ULTRAM-ER) 100 MG 24 hr tablet   Anxiety   Relevant Medications   sertraline (ZOLOFT) 100 MG tablet      Meds ordered this encounter  Medications  . oxybutynin (DITROPAN-XL) 10 MG 24 hr tablet    Sig: Take 1 tablet (10 mg total) by mouth at bedtime.    Dispense:  30 tablet    Refill:  2    Order Specific Question:   Supervising Provider    AnswerRochel Brome [id number]  . sertraline (ZOLOFT) 100 MG tablet    Sig: Take 1 tablet (100 mg total) by mouth in the morning and at bedtime.    Dispense:  60 tablet    Refill:  2    Order Specific Question:  Supervising Provider    AnswerRochel Brome [id number]  . omeprazole (PRILOSEC) 40 MG capsule    Sig: Take 1 capsule (40 mg total) by mouth 2 (two) times daily.    Dispense:  60 capsule    Refill:  2    Order Specific Question:   Supervising Provider    AnswerRochel Brome [id number]  . traMADol (ULTRAM-ER) 100 MG 24 hr tablet    Sig: Take 1 tablet (100 mg total) by mouth daily as needed for pain.    Dispense:  30 tablet    Refill:  0    Order Specific Question:   Supervising Provider    AnswerRochel Brome [id number]  . Naltrexone-buPROPion HCl ER 8-90 MG TB12    Sig: Start 1 tablet every morning for 7 days, then 1 tablet twice daily for 7 days, then 2 tablets every morning and one in the evening    Dispense:  120 tablet    Refill:  0    Order Specific Question:   Supervising Provider    AnswerShelton Silvas    Follow-up: Return in about 3 months (around 03/26/2020).    SARA R Lamont Glasscock, PA-C

## 2019-12-29 ENCOUNTER — Other Ambulatory Visit: Payer: BC Managed Care – PPO

## 2019-12-29 DIAGNOSIS — E782 Mixed hyperlipidemia: Secondary | ICD-10-CM | POA: Diagnosis not present

## 2019-12-29 DIAGNOSIS — I10 Essential (primary) hypertension: Secondary | ICD-10-CM | POA: Diagnosis not present

## 2019-12-30 LAB — TSH: TSH: 5 u[IU]/mL — ABNORMAL HIGH (ref 0.450–4.500)

## 2019-12-30 LAB — COMPREHENSIVE METABOLIC PANEL
ALT: 11 IU/L (ref 0–32)
AST: 14 IU/L (ref 0–40)
Albumin/Globulin Ratio: 1.7 (ref 1.2–2.2)
Albumin: 4 g/dL (ref 3.8–4.8)
Alkaline Phosphatase: 109 IU/L (ref 39–117)
BUN/Creatinine Ratio: 21 (ref 12–28)
BUN: 17 mg/dL (ref 8–27)
Bilirubin Total: 0.4 mg/dL (ref 0.0–1.2)
CO2: 22 mmol/L (ref 20–29)
Calcium: 9.8 mg/dL (ref 8.7–10.3)
Chloride: 105 mmol/L (ref 96–106)
Creatinine, Ser: 0.82 mg/dL (ref 0.57–1.00)
GFR calc Af Amer: 88 mL/min/{1.73_m2} (ref >59)
GFR calc non Af Amer: 76 mL/min/{1.73_m2} (ref >59)
Globulin, Total: 2.4 g/dL (ref 1.5–4.5)
Glucose: 95 mg/dL (ref 65–99)
Potassium: 4.7 mmol/L (ref 3.5–5.2)
Sodium: 140 mmol/L (ref 134–144)
Total Protein: 6.4 g/dL (ref 6.0–8.5)

## 2019-12-30 LAB — CBC WITH DIFFERENTIAL/PLATELET
Basophils Absolute: 0 10*3/uL (ref 0.0–0.2)
Basos: 0 %
EOS (ABSOLUTE): 0.3 10*3/uL (ref 0.0–0.4)
Eos: 3 %
Hematocrit: 46.2 % (ref 34.0–46.6)
Hemoglobin: 15.6 g/dL (ref 11.1–15.9)
Immature Grans (Abs): 0 10*3/uL (ref 0.0–0.1)
Immature Granulocytes: 0 %
Lymphocytes Absolute: 2.3 10*3/uL (ref 0.7–3.1)
Lymphs: 26 %
MCH: 29.2 pg (ref 26.6–33.0)
MCHC: 33.8 g/dL (ref 31.5–35.7)
MCV: 87 fL (ref 79–97)
Monocytes Absolute: 0.6 10*3/uL (ref 0.1–0.9)
Monocytes: 6 %
Neutrophils Absolute: 5.7 10*3/uL (ref 1.4–7.0)
Neutrophils: 65 %
Platelets: 280 10*3/uL (ref 150–450)
RBC: 5.34 x10E6/uL — ABNORMAL HIGH (ref 3.77–5.28)
RDW: 14.2 % (ref 11.7–15.4)
WBC: 8.9 10*3/uL (ref 3.4–10.8)

## 2019-12-30 LAB — HEMOGLOBIN A1C
Est. average glucose Bld gHb Est-mCnc: 114 mg/dL
Hgb A1c MFr Bld: 5.6 % (ref 4.8–5.6)

## 2019-12-30 LAB — LIPID PANEL
Chol/HDL Ratio: 6.6 ratio — ABNORMAL HIGH (ref 0.0–4.4)
Cholesterol, Total: 295 mg/dL — ABNORMAL HIGH (ref 100–199)
HDL: 45 mg/dL (ref >39)
LDL Chol Calc (NIH): 221 mg/dL — ABNORMAL HIGH (ref 0–99)
Triglycerides: 153 mg/dL — ABNORMAL HIGH (ref 0–149)
VLDL Cholesterol Cal: 29 mg/dL (ref 5–40)

## 2019-12-30 LAB — VITAMIN D 25 HYDROXY (VIT D DEFICIENCY, FRACTURES): Vit D, 25-Hydroxy: 7.5 ng/mL — ABNORMAL LOW (ref 30.0–100.0)

## 2019-12-30 LAB — CARDIOVASCULAR RISK ASSESSMENT

## 2020-01-03 ENCOUNTER — Other Ambulatory Visit: Payer: Self-pay | Admitting: Physician Assistant

## 2020-01-03 MED ORDER — ROSUVASTATIN CALCIUM 10 MG PO TABS: 10.0000 mg | ORAL_TABLET | Freq: Every day | ORAL | 1 refills | Status: DC

## 2020-01-03 MED ORDER — VITAMIN D (ERGOCALCIFEROL) 1.25 MG (50000 UNIT) PO CAPS: 50000.0000 [IU] | ORAL_CAPSULE | ORAL | 1 refills | Status: DC

## 2020-01-04 ENCOUNTER — Encounter: Payer: Self-pay | Admitting: Physician Assistant

## 2020-01-04 DIAGNOSIS — G44319 Acute post-traumatic headache, not intractable: Secondary | ICD-10-CM | POA: Diagnosis not present

## 2020-01-04 DIAGNOSIS — R519 Headache, unspecified: Secondary | ICD-10-CM | POA: Diagnosis not present

## 2020-03-27 ENCOUNTER — Ambulatory Visit (INDEPENDENT_AMBULATORY_CARE_PROVIDER_SITE_OTHER): Payer: BC Managed Care – PPO | Admitting: Physician Assistant

## 2020-03-27 ENCOUNTER — Encounter: Payer: Self-pay | Admitting: Physician Assistant

## 2020-03-27 ENCOUNTER — Other Ambulatory Visit: Payer: Self-pay

## 2020-03-27 VITALS — BP 144/82 | HR 93 | Temp 97.8°F | Ht 68.0 in | Wt 272.0 lb

## 2020-03-27 DIAGNOSIS — E559 Vitamin D deficiency, unspecified: Secondary | ICD-10-CM

## 2020-03-27 DIAGNOSIS — E119 Type 2 diabetes mellitus without complications: Secondary | ICD-10-CM | POA: Diagnosis not present

## 2020-03-27 DIAGNOSIS — E038 Other specified hypothyroidism: Secondary | ICD-10-CM

## 2020-03-27 DIAGNOSIS — I10 Essential (primary) hypertension: Secondary | ICD-10-CM | POA: Diagnosis not present

## 2020-03-27 DIAGNOSIS — E782 Mixed hyperlipidemia: Secondary | ICD-10-CM | POA: Diagnosis not present

## 2020-03-27 DIAGNOSIS — G8929 Other chronic pain: Secondary | ICD-10-CM

## 2020-03-27 DIAGNOSIS — M25561 Pain in right knee: Secondary | ICD-10-CM

## 2020-03-27 DIAGNOSIS — K219 Gastro-esophageal reflux disease without esophagitis: Secondary | ICD-10-CM

## 2020-03-27 HISTORY — DX: Other specified hypothyroidism: E03.8

## 2020-03-27 HISTORY — DX: Vitamin D deficiency, unspecified: E55.9

## 2020-03-27 MED ORDER — TRAMADOL HCL ER 100 MG PO TB24: 100.0000 mg | ORAL_TABLET | Freq: Every day | ORAL | 0 refills | Status: DC | PRN

## 2020-03-27 MED ORDER — BUDESONIDE-FORMOTEROL FUMARATE 160-4.5 MCG/ACT IN AERO: 2.0000 | INHALATION_SPRAY | Freq: Every day | RESPIRATORY_TRACT | 5 refills | Status: DC

## 2020-03-27 MED ORDER — LEVOTHYROXINE SODIUM 200 MCG PO TABS: 200.0000 ug | ORAL_TABLET | Freq: Every day | ORAL | 2 refills | Status: DC

## 2020-03-27 MED ORDER — VITAMIN D (ERGOCALCIFEROL) 1.25 MG (50000 UNIT) PO CAPS: 50000.0000 [IU] | ORAL_CAPSULE | ORAL | 5 refills | Status: DC

## 2020-03-27 MED ORDER — SERTRALINE HCL 100 MG PO TABS: 100.0000 mg | ORAL_TABLET | Freq: Two times a day (BID) | ORAL | 2 refills | Status: DC

## 2020-03-27 NOTE — Assessment & Plan Note (Signed)
Tramadol as needed - refill given

## 2020-03-27 NOTE — Assessment & Plan Note (Signed)
Continue current meds as directed 

## 2020-03-27 NOTE — Assessment & Plan Note (Signed)
Continue current meds labwork pending 

## 2020-03-27 NOTE — Progress Notes (Signed)
Established Patient Office Visit  Subjective:  Patient ID: Kathleen [name], female    DOB: 24-Apr-1956  Age: 64 y.o. MRN: [id number]  CC:  Chief Complaint  Patient presents with  . Hypertension  . Hyperlipidemia    HPI Kathleen [name] presents for follow up of chronic medical problems -  Pt with history of hypertension and CHF - she is currently on coreg 25 bid, furosemide 40mg  qd, lisinopril 40mg  qd, amlodipine 10 mg qd - has not made follow up appt with her cardiologist yet- denies any symptoms with no chest pain, no dyspnea, no edema  Pt with history of COPD - currently she has no symptoms but states she has been out of symbicort and requests refill of med  Pt with history of hypothyroidism - she is taking synthroid 218mcg qd but has been out for the past 4 days - is due for labwork  Pt has history of chronic pain in her right knee after having knee replacement surgery years ago - states she has been prescribed tramadol in the past for her pain and requests refill - says only uses med sporadically  Pt with history of GERD - is taking prilosec qd which controls her symptoms  Pt has history of anxiety with mild depression - she has been prescribed zoloft 100mg  2 po qd and has been on this dose for several years - voice no problems or concerns and would like refill of medication   Past Medical History:  Diagnosis Date  . Arthritis   . Cancer ([place])   . Chronic back pain   . Complication of anesthesia    Woke up during  Hysterectomy  . Coronary artery disease 2012   Mild to Moderate  . Family history of adverse reaction to anesthesia   . GERD (gastroesophageal reflux disease)    omeprazole prn  . Heart murmur    "nothing to be concerned about  . History of blood transfusion   . Hx of thyroid cancer   . Hyperlipidemia   . Hypertension   . Hypothyroidism   . Obesity   . PONV (postoperative nausea and vomiting)    with thyroid surgery  . Shortness of breath dyspnea    with exertion  . Sleep apnea, obstructive    possible  . Tobacco abuse     Past Surgical History:  Procedure Laterality Date  . ABDOMINAL HYSTERECTOMY  1999  . CARDIAC CATHETERIZATION  Feb 2012   LAD: 60% mid (FFR ratio 0.83), LCX: 30-40%, RCA 20%.   Marland Kitchen KNEE ARTHROPLASTY Right 05/01/2016   Procedure:  TOTAL KNEE ARTHROPLASTY;  Surgeon: Marybelle Killings, MD;  Location: [facility];  Service: Orthopedics;  Laterality: Right;  . RIGHT/LEFT HEART CATH AND CORONARY ANGIOGRAPHY Bilateral 01/17/2018   Procedure: RIGHT/LEFT HEART CATH AND CORONARY ANGIOGRAPHY;  Surgeon: Wellington Hampshire, MD;  Location: [facility];  Service: Cardiovascular;  Laterality: Bilateral;  . THYROIDECTOMY  2000    Family History  Problem Relation Age of Onset  . Diabetes Mother     Social History   Socioeconomic History  . Marital status: Divorced    Spouse name: Not on file  . Number of children: Not on file  . Years of education: Not on file  . Highest education level: Not on file  Occupational History  . Not on file  Tobacco Use  . Smoking status: Former Smoker    Packs/day: 0.25    Years: 20.00    Pack years:  5.00    Types: Cigarettes  . Smokeless tobacco: Never Used  . Tobacco comment: quit 2012  Substance and Sexual Activity  . Alcohol use: No    Alcohol/week: 3.0 standard drinks    Types: 3 Glasses of wine per week  . Drug use: No  . Sexual activity: Not on file  Other Topics Concern  . Not on file  Social History Narrative  . Not on file   Social Determinants of Health   Financial Resource Strain:   . Difficulty of Paying Living Expenses:   Food Insecurity:   . Worried About Charity fundraiser in the Last Year:   . Arboriculturist in the Last Year:   Transportation Needs:   . Film/video editor (Medical):   Marland Kitchen Lack of Transportation (Non-Medical):   Physical Activity:   . Days of Exercise per Week:   . Minutes of Exercise per Session:   Stress:   . Feeling of Stress :    Social Connections:   . Frequency of Communication with Friends and Family:   . Frequency of Social Gatherings with Friends and Family:   . Attends Religious Services:   . Active Member of Clubs or Organizations:   . Attends Archivist Meetings:   Marland Kitchen Marital Status:   Intimate Partner Violence:   . Fear of Current or Ex-Partner:   . Emotionally Abused:   Marland Kitchen Physically Abused:   . Sexually Abused:      Current Outpatient Medications:  .  amLODipine (NORVASC) 10 MG tablet, TAKE 1 TABLET BY MOUTH ONCE DAILY, Disp: 30 tablet, Rfl: 5 .  aspirin 81 MG EC tablet, Take 81 mg by mouth daily., Disp: , Rfl: 0 .  budesonide-formoterol (SYMBICORT) 160-4.5 MCG/ACT inhaler, Inhale 2 puffs into the lungs daily. Rinse mouth after use., Disp: 1 Inhaler, Rfl: 5 .  busPIRone (BUSPAR) 15 MG tablet, Take 1 tablet by mouth daily. 1 po bid, Disp: , Rfl: 10 .  carvedilol (COREG) 25 MG tablet, Take 25 mg by mouth 2 (two) times daily with a meal., Disp: , Rfl:  .  furosemide (LASIX) 40 MG tablet, Take 1 tablet (40 mg total) by mouth daily., Disp: 30 tablet, Rfl:  .  levothyroxine (SYNTHROID) 200 MCG tablet, Take 1 tablet (200 mcg total) by mouth daily before breakfast., Disp: 30 tablet, Rfl: 2 .  lisinopril (PRINIVIL,ZESTRIL) 40 MG tablet, Take 40 mg by mouth daily., Disp: , Rfl:  .  omeprazole (PRILOSEC) 40 MG capsule, Take 1 capsule (40 mg total) by mouth 2 (two) times daily., Disp: 60 capsule, Rfl: 2 .  oxybutynin (DITROPAN-XL) 10 MG 24 hr tablet, Take 1 tablet (10 mg total) by mouth at bedtime., Disp: 30 tablet, Rfl: 2 .  rosuvastatin (CRESTOR) 10 MG tablet, Take 1 tablet (10 mg total) by mouth daily., Disp: 90 tablet, Rfl: 1 .  sertraline (ZOLOFT) 100 MG tablet, Take 1 tablet (100 mg total) by mouth in the morning and at bedtime., Disp: 60 tablet, Rfl: 2 .  traMADol (ULTRAM-ER) 100 MG 24 hr tablet, Take 1 tablet (100 mg total) by mouth daily as needed for pain., Disp: 30 tablet, Rfl: 0 .  Vitamin D,  Ergocalciferol, (DRISDOL) 1.25 MG (50000 UNIT) CAPS capsule, Take 1 capsule (50,000 Units total) by mouth every 7 (seven) days., Disp: 5 capsule, Rfl: 5   Allergies  Allergen Reactions  . Hydrocodone-Acetaminophen Other (See Comments)    Headache, allergy to hydrocodone Headache, allergy to hydrocodone   .  Latex Hives  . Loracarbef Other (See Comments)  . Penicillins Other (See Comments)    Syncope Has patient had a PCN reaction causing immediate rash, facial/tongue/throat swelling, SOB or lightheadedness with hypotension: Unknown Has patient had a PCN reaction causing severe rash involving mucus membranes or skin necrosis: No Has patient had a PCN reaction that required hospitalization: No Has patient had a PCN reaction occurring within the last 10 years: Unknown If all of the above answers are "NO", then may proceed with Cephalosporin use.  Syncope Has patient had a PCN reaction causing immediate rash, facial/tongue/throat swelling, SOB or lightheadedness with hypotension: Unknown Has patient had a PCN reaction causing severe rash involving mucus membranes or skin necrosis: No Has patient had a PCN reaction that required hospitalization: No Has patient had a PCN reaction occurring within the last 10 years: Unknown If all of the above answers are "NO", then may proceed with Cephalosporin use.  Marland Kitchen Hydrocodone-Acetaminophen Other (See Comments)    Headache, allergy to hydrocodone    ROS CONSTITUTIONAL: Negative for chills, fatigue, fever, unintentional weight gain and unintentional weight loss.  E/N/T: Negative for ear pain, nasal congestion and sore throat.  CARDIOVASCULAR: Negative for chest pain, dizziness, palpitations and pedal edema.  RESPIRATORY: Negative for recent cough and dyspnea.  GASTROINTESTINAL: Negative for abdominal pain, acid reflux symptoms, constipation, diarrhea, nausea and vomiting.  MSK: Negative for arthralgias and myalgias.  INTEGUMENTARY: Negative for rash.   NEUROLOGICAL: Negative for dizziness and headaches.  PSYCHIATRIC: Negative for sleep disturbance and to question depression screen.  Negative for depression, negative for anhedonia.        Objective:    PHYSICAL EXAM:   VS: BP (!) 144/82 (BP Location: Left Arm, Patient Position: Sitting)   Pulse 93   Temp 97.8 F (36.6 C) (Temporal)   Ht 5\' 8"  (1.727 m)   Wt 272 lb (123.4 kg)   SpO2 97%   BMI 41.36 kg/m    PHYSICAL EXAM:   VS: BP (!) 144/82 (BP Location: Left Arm, Patient Position: Sitting)   Pulse 93   Temp 97.8 F (36.6 C) (Temporal)   Ht 5\' 8"  (1.727 m)   Wt 272 lb (123.4 kg)   SpO2 97%   BMI 41.36 kg/m   GEN: Well nourished, well developed, in no acute distress   Cardiac: RRR; no murmurs, rubs, or gallops,no edema - no significant varicosities Respiratory:  normal respiratory rate and pattern with no distress - normal breath sounds with no rales, rhonchi, wheezes or rubs  MS: no deformity or atrophy  Skin: warm and dry, no rash  Neuro:  Alert and Oriented x 3, Strength and sensation are intact - CN II-Xii grossly intact Psych: euthymic mood, appropriate affect and demeanor   BP (!) 144/82 (BP Location: Left Arm, Patient Position: Sitting)   Pulse 93   Temp 97.8 F (36.6 C) (Temporal)   Ht 5\' 8"  (1.727 m)   Wt 272 lb (123.4 kg)   SpO2 97%   BMI 41.36 kg/m  Wt Readings from Last 3 Encounters:  03/27/20 272 lb (123.4 kg)  12/28/19 275 lb (124.7 kg)  08/09/18 274 lb 6.4 oz (124.5 kg)     Health Maintenance Due  Topic Date Due  . Hepatitis C Screening  Never done  . PNEUMOCOCCAL POLYSACCHARIDE VACCINE AGE 56-64 HIGH RISK  Never done  . FOOT EXAM  Never done  . OPHTHALMOLOGY EXAM  Never done  . COVID-19 Vaccine (1) Never done  . HIV Screening  Never done  . PAP SMEAR-Modifier  Never done  . MAMMOGRAM  Never done    There are no preventive care reminders to display for this patient.  Lab Results  Component Value Date   TSH 5.000 (H) 12/29/2019    Lab Results  Component Value Date   WBC 8.9 12/29/2019   HGB 15.6 12/29/2019   HCT 46.2 12/29/2019   MCV 87 12/29/2019   PLT 280 12/29/2019   Lab Results  Component Value Date   NA 140 12/29/2019   K 4.7 12/29/2019   CO2 22 12/29/2019   GLUCOSE 95 12/29/2019   BUN 17 12/29/2019   CREATININE 0.82 12/29/2019   BILITOT 0.4 12/29/2019   ALKPHOS 109 12/29/2019   AST 14 12/29/2019   ALT 11 12/29/2019   PROT 6.4 12/29/2019   ALBUMIN 4.0 12/29/2019   CALCIUM 9.8 12/29/2019   ANIONGAP 9 01/10/2018   Lab Results  Component Value Date   CHOL 295 (H) 12/29/2019   Lab Results  Component Value Date   HDL 45 12/29/2019   Lab Results  Component Value Date   LDLCALC 221 (H) 12/29/2019   Lab Results  Component Value Date   TRIG 153 (H) 12/29/2019   Lab Results  Component Value Date   CHOLHDL 6.6 (H) 12/29/2019   Lab Results  Component Value Date   HGBA1C 5.6 12/29/2019      Assessment & Plan:   Problem List Items Addressed This Visit      Cardiovascular and Mediastinum   Hypertension - Primary    Well controlled.  No changes to medicines.  Continue to work on eating a healthy diet and exercise.  Labs drawn today.        Relevant Orders   CBC with Differential/Platelet     Digestive   GERD without esophagitis    Continue current meds as directed        Endocrine   Other specified hypothyroidism    TSH pending Continue current meds      Relevant Medications   levothyroxine (SYNTHROID) 200 MCG tablet   Other Relevant Orders   TSH   Diabetes mellitus type II, non insulin dependent ([place])    Well controlled.  No changes to medicines.  Continue to work on eating a healthy diet and exercise.  Labs drawn today.        Relevant Orders   Comprehensive metabolic panel   Hemoglobin A1c     Other   Mixed hyperlipidemia    Well controlled.  No changes to medicines.  Continue to work on eating a healthy diet and exercise.  Labs drawn today.         Relevant Orders   Lipid panel   Chronic pain of right knee    Tramadol as needed - refill given      Relevant Medications   sertraline (ZOLOFT) 100 MG tablet   traMADol (ULTRAM-ER) 100 MG 24 hr tablet   Vitamin D insufficiency    Continue current meds labwork pending      Relevant Orders   VITAMIN D 25 Hydroxy (Vit-D Deficiency, Fractures)      Meds ordered this encounter  Medications  . budesonide-formoterol (SYMBICORT) 160-4.5 MCG/ACT inhaler    Sig: Inhale 2 puffs into the lungs daily. Rinse mouth after use.    Dispense:  1 Inhaler    Refill:  5    Order Specific Question:   Supervising Provider    AnswerRochel Brome [id number]  .  levothyroxine (SYNTHROID) 200 MCG tablet    Sig: Take 1 tablet (200 mcg total) by mouth daily before breakfast.    Dispense:  30 tablet    Refill:  2    Order Specific Question:   Supervising Provider    AnswerShelton Silvas  . sertraline (ZOLOFT) 100 MG tablet    Sig: Take 1 tablet (100 mg total) by mouth in the morning and at bedtime.    Dispense:  60 tablet    Refill:  2    Order Specific Question:   Supervising Provider    AnswerRochel Brome [id number]  . traMADol (ULTRAM-ER) 100 MG 24 hr tablet    Sig: Take 1 tablet (100 mg total) by mouth daily as needed for pain.    Dispense:  30 tablet    Refill:  0    Order Specific Question:   Supervising Provider    AnswerRochel Brome [id number]  . Vitamin D, Ergocalciferol, (DRISDOL) 1.25 MG (50000 UNIT) CAPS capsule    Sig: Take 1 capsule (50,000 Units total) by mouth every 7 (seven) days.    Dispense:  5 capsule    Refill:  5    Order Specific Question:   Supervising Provider    Answer:   [name] Silvas    Follow-up: Return in about 3 months (around 06/27/2020) for chronic fasting follow up.    SARA R DAVIS, PA-C

## 2020-03-27 NOTE — Assessment & Plan Note (Signed)
Well controlled.  ?No changes to medicines.  ?Continue to work on eating a healthy diet and exercise.  ?Labs drawn today.  ?

## 2020-03-27 NOTE — Assessment & Plan Note (Signed)
TSH pending Continue current meds 

## 2020-03-28 ENCOUNTER — Other Ambulatory Visit: Payer: Self-pay | Admitting: Physician Assistant

## 2020-03-28 LAB — COMPREHENSIVE METABOLIC PANEL
ALT: 14 IU/L (ref 0–32)
AST: 16 IU/L (ref 0–40)
Albumin/Globulin Ratio: 1.6 (ref 1.2–2.2)
Albumin: 3.9 g/dL (ref 3.8–4.8)
Alkaline Phosphatase: 104 IU/L (ref 48–121)
BUN/Creatinine Ratio: 19 (ref 12–28)
BUN: 15 mg/dL (ref 8–27)
Bilirubin Total: 0.3 mg/dL (ref 0.0–1.2)
CO2: 20 mmol/L (ref 20–29)
Calcium: 10 mg/dL (ref 8.7–10.3)
Chloride: 105 mmol/L (ref 96–106)
Creatinine, Ser: 0.8 mg/dL (ref 0.57–1.00)
GFR calc Af Amer: 91 mL/min/{1.73_m2} (ref >59)
GFR calc non Af Amer: 79 mL/min/{1.73_m2} (ref >59)
Globulin, Total: 2.4 g/dL (ref 1.5–4.5)
Glucose: 103 mg/dL — ABNORMAL HIGH (ref 65–99)
Potassium: 3.9 mmol/L (ref 3.5–5.2)
Sodium: 139 mmol/L (ref 134–144)
Total Protein: 6.3 g/dL (ref 6.0–8.5)

## 2020-03-28 LAB — CBC WITH DIFFERENTIAL/PLATELET
Basophils Absolute: 0 10*3/uL (ref 0.0–0.2)
Basos: 1 %
EOS (ABSOLUTE): 0.2 10*3/uL (ref 0.0–0.4)
Eos: 3 %
Hematocrit: 48.4 % — ABNORMAL HIGH (ref 34.0–46.6)
Hemoglobin: 15.9 g/dL (ref 11.1–15.9)
Immature Grans (Abs): 0 10*3/uL (ref 0.0–0.1)
Immature Granulocytes: 0 %
Lymphocytes Absolute: 2 10*3/uL (ref 0.7–3.1)
Lymphs: 23 %
MCH: 28.6 pg (ref 26.6–33.0)
MCHC: 32.9 g/dL (ref 31.5–35.7)
MCV: 87 fL (ref 79–97)
Monocytes Absolute: 0.5 10*3/uL (ref 0.1–0.9)
Monocytes: 6 %
Neutrophils Absolute: 6 10*3/uL (ref 1.4–7.0)
Neutrophils: 67 %
Platelets: 287 10*3/uL (ref 150–450)
RBC: 5.55 x10E6/uL — ABNORMAL HIGH (ref 3.77–5.28)
RDW: 13.6 % (ref 11.7–15.4)
WBC: 8.8 10*3/uL (ref 3.4–10.8)

## 2020-03-28 LAB — LIPID PANEL
Chol/HDL Ratio: 7.3 ratio — ABNORMAL HIGH (ref 0.0–4.4)
Cholesterol, Total: 255 mg/dL — ABNORMAL HIGH (ref 100–199)
HDL: 35 mg/dL — ABNORMAL LOW (ref >39)
LDL Chol Calc (NIH): 173 mg/dL — ABNORMAL HIGH (ref 0–99)
Triglycerides: 249 mg/dL — ABNORMAL HIGH (ref 0–149)
VLDL Cholesterol Cal: 47 mg/dL — ABNORMAL HIGH (ref 5–40)

## 2020-03-28 LAB — HEMOGLOBIN A1C
Est. average glucose Bld gHb Est-mCnc: 111 mg/dL
Hgb A1c MFr Bld: 5.5 % (ref 4.8–5.6)

## 2020-03-28 LAB — CARDIOVASCULAR RISK ASSESSMENT

## 2020-03-28 LAB — TSH: TSH: 0.024 u[IU]/mL — ABNORMAL LOW (ref 0.450–4.500)

## 2020-03-28 LAB — VITAMIN D 25 HYDROXY (VIT D DEFICIENCY, FRACTURES): Vit D, 25-Hydroxy: 13.9 ng/mL — ABNORMAL LOW (ref 30.0–100.0)

## 2020-03-28 MED ORDER — LEVOTHYROXINE SODIUM 175 MCG PO TABS: 175.0000 ug | ORAL_TABLET | Freq: Every day | ORAL | 0 refills | Status: DC

## 2020-03-28 MED ORDER — ROSUVASTATIN CALCIUM 20 MG PO TABS: 20.0000 mg | ORAL_TABLET | Freq: Every day | ORAL | 1 refills | Status: DC

## 2020-05-21 ENCOUNTER — Other Ambulatory Visit: Payer: Self-pay | Admitting: Physician Assistant

## 2020-05-22 ENCOUNTER — Ambulatory Visit: Payer: BC Managed Care – PPO

## 2020-07-03 ENCOUNTER — Ambulatory Visit: Payer: BC Managed Care – PPO | Admitting: Physician Assistant

## 2020-07-10 ENCOUNTER — Other Ambulatory Visit: Payer: Self-pay | Admitting: Physician Assistant

## 2020-07-26 DIAGNOSIS — G4733 Obstructive sleep apnea (adult) (pediatric): Secondary | ICD-10-CM | POA: Diagnosis not present

## 2020-08-27 DIAGNOSIS — H40019 Open angle with borderline findings, low risk, unspecified eye: Secondary | ICD-10-CM | POA: Diagnosis not present

## 2020-08-27 DIAGNOSIS — H40059 Ocular hypertension, unspecified eye: Secondary | ICD-10-CM | POA: Diagnosis not present

## 2020-08-30 ENCOUNTER — Other Ambulatory Visit: Payer: Self-pay | Admitting: Physician Assistant

## 2020-09-24 ENCOUNTER — Other Ambulatory Visit: Payer: Self-pay

## 2020-09-24 ENCOUNTER — Encounter: Payer: Self-pay | Admitting: Physician Assistant

## 2020-09-24 ENCOUNTER — Ambulatory Visit (INDEPENDENT_AMBULATORY_CARE_PROVIDER_SITE_OTHER): Payer: BC Managed Care – PPO | Admitting: Physician Assistant

## 2020-09-24 VITALS — BP 128/82 | HR 55 | Temp 97.9°F | Ht 68.0 in | Wt 273.0 lb

## 2020-09-24 DIAGNOSIS — E559 Vitamin D deficiency, unspecified: Secondary | ICD-10-CM

## 2020-09-24 DIAGNOSIS — Z23 Encounter for immunization: Secondary | ICD-10-CM | POA: Diagnosis not present

## 2020-09-24 DIAGNOSIS — I10 Essential (primary) hypertension: Secondary | ICD-10-CM

## 2020-09-24 DIAGNOSIS — E782 Mixed hyperlipidemia: Secondary | ICD-10-CM

## 2020-09-24 DIAGNOSIS — Z1231 Encounter for screening mammogram for malignant neoplasm of breast: Secondary | ICD-10-CM

## 2020-09-24 DIAGNOSIS — E038 Other specified hypothyroidism: Secondary | ICD-10-CM

## 2020-09-24 HISTORY — DX: Encounter for immunization: Z23

## 2020-09-24 MED ORDER — TRAMADOL HCL ER 100 MG PO TB24: 100.0000 mg | ORAL_TABLET | Freq: Every day | ORAL | 0 refills | Status: DC | PRN

## 2020-09-24 MED ORDER — BUSPIRONE HCL 15 MG PO TABS: 15.0000 mg | ORAL_TABLET | Freq: Every day | ORAL | 2 refills | Status: DC

## 2020-09-24 MED ORDER — OXYBUTYNIN CHLORIDE ER 10 MG PO TB24: 10.0000 mg | ORAL_TABLET | Freq: Every day | ORAL | 2 refills | Status: DC

## 2020-09-24 MED ORDER — VITAMIN D (ERGOCALCIFEROL) 1.25 MG (50000 UNIT) PO CAPS: 50000.0000 [IU] | ORAL_CAPSULE | ORAL | 2 refills | Status: DC

## 2020-09-24 MED ORDER — SERTRALINE HCL 100 MG PO TABS: 100.0000 mg | ORAL_TABLET | Freq: Two times a day (BID) | ORAL | 2 refills | Status: DC

## 2020-09-24 MED ORDER — LISINOPRIL 40 MG PO TABS: 40.0000 mg | ORAL_TABLET | Freq: Every day | ORAL | 2 refills | Status: DC

## 2020-09-24 MED ORDER — PHENTERMINE HCL 37.5 MG PO TABS: 37.5000 mg | ORAL_TABLET | Freq: Every day | ORAL | 0 refills | Status: DC

## 2020-09-24 MED ORDER — OMEPRAZOLE 40 MG PO CPDR: 40.0000 mg | DELAYED_RELEASE_CAPSULE | Freq: Two times a day (BID) | ORAL | 2 refills | Status: DC

## 2020-09-24 NOTE — Progress Notes (Signed)
Established Patient Office Visit  Subjective:  Patient ID: Kathleen [name], female    DOB: Mar 20, 1956  Age: 64 y.o. MRN: [id number]  CC:  Chief Complaint  Patient presents with   Hypertension    HPI Kathleen [name] presents for follow up of chronic medical problems -  Pt with history of hypertension and CHF - she is currently on coreg 25 bid, furosemide 40mg  qd, lisinopril 40mg  qd, amlodipine 10 mg qd - has not made follow up appt with her cardiologist yet- denies any symptoms with no chest pain, no dyspnea, no edema - states she is stable on current medications  Pt with history of COPD - currently she has no symptoms and is doing well  Pt with history of hypothyroidism - she is taking synthroid 267mcg qd  - she was supposed to decrease down to 132mcg 6 months ago but never did  Pt has history of chronic pain in her right knee after having knee replacement surgery years ago - states she has been prescribed tramadol in the past for her pain and requests refill - says only uses med sporadically and has not had a refill in several months  Pt with history of GERD - is taking prilosec qd which controls her symptoms - denies any GERD symptoms  Pt has history of anxiety with mild depression - she has been prescribed zoloft 100mg  2 po qd and has been on this dose for several years - is stable on the medication  Pt has been on adipex in the past and would like to try medication again to help with weight loss   would like refill of medication Past Medical History:  Diagnosis Date   Arthritis    Cancer ([place])    Chronic back pain    Complication of anesthesia    Woke up during  Hysterectomy   Coronary artery disease 2012   Mild to Moderate   Family history of adverse reaction to anesthesia    GERD (gastroesophageal reflux disease)    omeprazole prn   Heart murmur    "nothing to be concerned about   History of blood transfusion    Hx of thyroid cancer    Hyperlipidemia     Hypertension    Hypothyroidism    Obesity    PONV (postoperative nausea and vomiting)    with thyroid surgery   Shortness of breath dyspnea    with exertion   Sleep apnea, obstructive    possible   Tobacco abuse     Past Surgical History:  Procedure Laterality Date   ABDOMINAL HYSTERECTOMY  1999   CARDIAC CATHETERIZATION  Feb 2012   LAD: 60% mid (FFR ratio 0.83), LCX: 30-40%, RCA 20%.    KNEE ARTHROPLASTY Right 05/01/2016   Procedure:  TOTAL KNEE ARTHROPLASTY;  Surgeon: Marybelle Killings, MD;  Location: [facility];  Service: Orthopedics;  Laterality: Right;   RIGHT/LEFT HEART CATH AND CORONARY ANGIOGRAPHY Bilateral 01/17/2018   Procedure: RIGHT/LEFT HEART CATH AND CORONARY ANGIOGRAPHY;  Surgeon: Wellington Hampshire, MD;  Location: [facility];  Service: Cardiovascular;  Laterality: Bilateral;   THYROIDECTOMY  2000    Family History  Problem Relation Age of Onset   Diabetes Mother     Social History   Socioeconomic History   Marital status: Divorced    Spouse name: Not on file   Number of children: Not on file   Years of education: Not on file   Highest education level: Not on  file  Occupational History   Not on file  Tobacco Use   Smoking status: Former Smoker    Packs/day: 0.25    Years: 20.00    Pack years: 5.00    Types: Cigarettes   Smokeless tobacco: Never Used   Tobacco comment: quit 2012  Vaping Use   Vaping Use: Never used  Substance and Sexual Activity   Alcohol use: No    Alcohol/week: 3.0 standard drinks    Types: 3 Glasses of wine per week   Drug use: No   Sexual activity: Not on file  Other Topics Concern   Not on file  Social History Narrative   Not on file   Social Determinants of Health   Financial Resource Strain:    Difficulty of Paying Living Expenses: Not on file  Food Insecurity:    Worried About [place] in the Last Year: Not on file   [employer] of Food in the Last Year: Not on file    Transportation Needs:    Lack of Transportation (Medical): Not on file   Lack of Transportation (Non-Medical): Not on file  Physical Activity:    Days of Exercise per Week: Not on file   Minutes of Exercise per Session: Not on file  Stress:    Feeling of Stress : Not on file  Social Connections:    Frequency of Communication with Friends and Family: Not on file   Frequency of Social Gatherings with Friends and Family: Not on file   Attends Religious Services: Not on file   Active Member of Clubs or Organizations: Not on file   Attends Archivist Meetings: Not on file   Marital Status: Not on file  Intimate Partner Violence:    Fear of Current or Ex-Partner: Not on file   Emotionally Abused: Not on file   Physically Abused: Not on file   Sexually Abused: Not on file     Current Outpatient Medications:    amLODipine (NORVASC) 10 MG tablet, TAKE 1 TABLET BY MOUTH ONCE DAILY, Disp: 30 tablet, Rfl: 5   aspirin 81 MG EC tablet, Take 81 mg by mouth daily., Disp: , Rfl: 0   busPIRone (BUSPAR) 15 MG tablet, Take 1 tablet (15 mg total) by mouth daily. 1 po bid, Disp: 60 tablet, Rfl: 2   carvedilol (COREG) 25 MG tablet, Take 25 mg by mouth 2 (two) times daily with a meal., Disp: , Rfl:    furosemide (LASIX) 40 MG tablet, Take 1 tablet (40 mg total) by mouth daily., Disp: 30 tablet, Rfl:    lisinopril (ZESTRIL) 40 MG tablet, Take 1 tablet (40 mg total) by mouth daily., Disp: 30 tablet, Rfl: 2   omeprazole (PRILOSEC) 40 MG capsule, Take 1 capsule (40 mg total) by mouth 2 (two) times daily., Disp: 60 capsule, Rfl: 2   oxybutynin (DITROPAN-XL) 10 MG 24 hr tablet, Take 1 tablet (10 mg total) by mouth at bedtime., Disp: 30 tablet, Rfl: 2   rosuvastatin (CRESTOR) 20 MG tablet, Take 1 tablet (20 mg total) by mouth daily., Disp: 90 tablet, Rfl: 1   sertraline (ZOLOFT) 100 MG tablet, Take 1 tablet (100 mg total) by mouth in the morning and at bedtime., Disp: 60  tablet, Rfl: 2   SYNTHROID 200 MCG tablet, Take 200 mcg by mouth daily., Disp: , Rfl:    traMADol (ULTRAM-ER) 100 MG 24 hr tablet, Take 1 tablet (100 mg total) by mouth daily as needed for pain., Disp:  30 tablet, Rfl: 0   Vitamin D, Ergocalciferol, (DRISDOL) 1.25 MG (50000 UNIT) CAPS capsule, Take 1 capsule (50,000 Units total) by mouth every 7 (seven) days., Disp: 5 capsule, Rfl: 2   phentermine (ADIPEX-P) 37.5 MG tablet, Take 1 tablet (37.5 mg total) by mouth daily before breakfast., Disp: 30 tablet, Rfl: 0   Allergies  Allergen Reactions   Hydrocodone-Acetaminophen Other (See Comments)    Headache, allergy to hydrocodone Headache, allergy to hydrocodone    Latex Hives   Loracarbef Other (See Comments)   Penicillins Other (See Comments)    Syncope Has patient had a PCN reaction causing immediate rash, facial/tongue/throat swelling, SOB or lightheadedness with hypotension: Unknown Has patient had a PCN reaction causing severe rash involving mucus membranes or skin necrosis: No Has patient had a PCN reaction that required hospitalization: No Has patient had a PCN reaction occurring within the last 10 years: Unknown If all of the above answers are "NO", then may proceed with Cephalosporin use.  Syncope Has patient had a PCN reaction causing immediate rash, facial/tongue/throat swelling, SOB or lightheadedness with hypotension: Unknown Has patient had a PCN reaction causing severe rash involving mucus membranes or skin necrosis: No Has patient had a PCN reaction that required hospitalization: No Has patient had a PCN reaction occurring within the last 10 years: Unknown If all of the above answers are "NO", then may proceed with Cephalosporin use.   Hydrocodone-Acetaminophen Other (See Comments)    Headache, allergy to hydrocodone    ROS CONSTITUTIONAL: Negative for chills, fatigue, fever, unintentional weight gain and unintentional weight loss.  E/N/T: Negative for ear pain,  nasal congestion and sore throat.  CARDIOVASCULAR: Negative for chest pain, dizziness, palpitations and pedal edema.  RESPIRATORY: Negative for recent cough and dyspnea.  GASTROINTESTINAL: Negative for abdominal pain, acid reflux symptoms, constipation, diarrhea, nausea and vomiting.  MSK: Negative for arthralgias and myalgias.  INTEGUMENTARY: Negative for rash.  NEUROLOGICAL: Negative for dizziness and headaches.  PSYCHIATRIC: Negative for sleep disturbance and to question depression screen.  Negative for depression, negative for anhedonia.        Objective:    PHYSICAL EXAM:   VS: BP 128/82 (BP Location: Right Arm, Patient Position: Sitting, Cuff Size: Large)    Pulse (!) 55    Temp 97.9 F (36.6 C) (Temporal)    Ht 5\' 8"  (1.727 m)    Wt 273 lb (123.8 kg)    SpO2 98%    BMI 41.51 kg/m    PHYSICAL EXAM:   VS: BP 128/82 (BP Location: Right Arm, Patient Position: Sitting, Cuff Size: Large)    Pulse (!) 55    Temp 97.9 F (36.6 C) (Temporal)    Ht 5\' 8"  (1.727 m)    Wt 273 lb (123.8 kg)    SpO2 98%    BMI 41.51 kg/m   GEN: Well nourished, well developed, in no acute distress   Cardiac: RRR; no murmurs, rubs, or gallops,no edema - no significant varicosities Respiratory:  normal respiratory rate and pattern with no distress - normal breath sounds with no rales, rhonchi, wheezes or rubs  Skin: warm and dry, no rash  Neuro:  Alert and Oriented x 3, Strength and sensation are intact - CN II-Xii grossly intact Psych: euthymic mood, appropriate affect and demeanor   BP 128/82 (BP Location: Right Arm, Patient Position: Sitting, Cuff Size: Large)    Pulse (!) 55    Temp 97.9 F (36.6 C) (Temporal)    Ht 5\' 8"  (1.727  m)    Wt 273 lb (123.8 kg)    SpO2 98%    BMI 41.51 kg/m  Wt Readings from Last 3 Encounters:  09/24/20 273 lb (123.8 kg)  03/27/20 272 lb (123.4 kg)  12/28/19 275 lb (124.7 kg)     Health Maintenance Due  Topic Date Due   Hepatitis C Screening  Never done   FOOT  EXAM  Never done   HIV Screening  Never done   MAMMOGRAM  Never done    There are no preventive care reminders to display for this patient.  Lab Results  Component Value Date   TSH 0.024 (L) 03/27/2020   Lab Results  Component Value Date   WBC 8.8 03/27/2020   HGB 15.9 03/27/2020   HCT 48.4 (H) 03/27/2020   MCV 87 03/27/2020   PLT 287 03/27/2020   Lab Results  Component Value Date   NA 139 03/27/2020   K 3.9 03/27/2020   CO2 20 03/27/2020   GLUCOSE 103 (H) 03/27/2020   BUN 15 03/27/2020   CREATININE 0.80 03/27/2020   BILITOT 0.3 03/27/2020   ALKPHOS 104 03/27/2020   AST 16 03/27/2020   ALT 14 03/27/2020   PROT 6.3 03/27/2020   ALBUMIN 3.9 03/27/2020   CALCIUM 10.0 03/27/2020   ANIONGAP 9 01/10/2018   Lab Results  Component Value Date   CHOL 255 (H) 03/27/2020   Lab Results  Component Value Date   HDL 35 (L) 03/27/2020   Lab Results  Component Value Date   LDLCALC 173 (H) 03/27/2020   Lab Results  Component Value Date   TRIG 249 (H) 03/27/2020   Lab Results  Component Value Date   CHOLHDL 7.3 (H) 03/27/2020   Lab Results  Component Value Date   HGBA1C 5.5 03/27/2020      Assessment & Plan:   Problem List Items Addressed This Visit      Cardiovascular and Mediastinum   Hypertension - Primary   Relevant Medications   lisinopril (ZESTRIL) 40 MG tablet   Other Relevant Orders   CBC with Differential/Platelet   Comprehensive metabolic panel     Endocrine   Other specified hypothyroidism   Relevant Medications   SYNTHROID 200 MCG tablet   Other Relevant Orders   TSH     Other   Mixed hyperlipidemia   Relevant Medications   lisinopril (ZESTRIL) 40 MG tablet   Other Relevant Orders   Lipid panel   Visit for screening mammogram   Relevant Orders   MM Digital Screening   Morbid obesity ([place])   Relevant Medications   phentermine (ADIPEX-P) 37.5 MG tablet   Vitamin D insufficiency   Relevant Orders   Vitamin D, 25-hydroxy   Need for  prophylactic vaccination and inoculation against influenza   Relevant Orders   Flu Vaccine MDCK QUAD PF (Completed)      Meds ordered this encounter  Medications   busPIRone (BUSPAR) 15 MG tablet    Sig: Take 1 tablet (15 mg total) by mouth daily. 1 po bid    Dispense:  60 tablet    Refill:  2    Order Specific Question:   Supervising Provider    Answer:   [name] Silvas   lisinopril (ZESTRIL) 40 MG tablet    Sig: Take 1 tablet (40 mg total) by mouth daily.    Dispense:  30 tablet    Refill:  2    Order Specific Question:   Supervising Provider  Answer:   [name] Silvas   omeprazole (PRILOSEC) 40 MG capsule    Sig: Take 1 capsule (40 mg total) by mouth 2 (two) times daily.    Dispense:  60 capsule    Refill:  2    Order Specific Question:   Supervising Provider    Answer:   [name] Silvas   oxybutynin (DITROPAN-XL) 10 MG 24 hr tablet    Sig: Take 1 tablet (10 mg total) by mouth at bedtime.    Dispense:  30 tablet    Refill:  2    Order Specific Question:   Supervising Provider    Answer:   [name] Silvas   sertraline (ZOLOFT) 100 MG tablet    Sig: Take 1 tablet (100 mg total) by mouth in the morning and at bedtime.    Dispense:  60 tablet    Refill:  2    Order Specific Question:   Supervising Provider    Answer:   [name] Silvas   traMADol (ULTRAM-ER) 100 MG 24 hr tablet    Sig: Take 1 tablet (100 mg total) by mouth daily as needed for pain.    Dispense:  30 tablet    Refill:  0    Order Specific Question:   Supervising Provider    Answer:   [name] Silvas   Vitamin D, Ergocalciferol, (DRISDOL) 1.25 MG (50000 UNIT) CAPS capsule    Sig: Take 1 capsule (50,000 Units total) by mouth every 7 (seven) days.    Dispense:  5 capsule    Refill:  2    Order Specific Question:   Supervising Provider    Answer:   [name] Silvas   phentermine (ADIPEX-P) 37.5 MG tablet    Sig: Take 1 tablet (37.5 mg total) by mouth daily  before breakfast.    Dispense:  30 tablet    Refill:  0    Order Specific Question:   Supervising Provider    AnswerShelton Silvas    Follow-up: Return in about 4 weeks (around 10/22/2020) for follow up.    SARA R Cy [place], PA-C

## 2020-09-25 ENCOUNTER — Other Ambulatory Visit: Payer: Self-pay | Admitting: Physician Assistant

## 2020-09-25 DIAGNOSIS — E038 Other specified hypothyroidism: Secondary | ICD-10-CM

## 2020-09-25 LAB — CBC WITH DIFFERENTIAL/PLATELET
Basophils Absolute: 0.1 10*3/uL (ref 0.0–0.2)
Basos: 1 %
EOS (ABSOLUTE): 0.3 10*3/uL (ref 0.0–0.4)
Eos: 3 %
Hematocrit: 44.3 % (ref 34.0–46.6)
Hemoglobin: 15 g/dL (ref 11.1–15.9)
Immature Grans (Abs): 0 10*3/uL (ref 0.0–0.1)
Immature Granulocytes: 0 %
Lymphocytes Absolute: 2.1 10*3/uL (ref 0.7–3.1)
Lymphs: 25 %
MCH: 29 pg (ref 26.6–33.0)
MCHC: 33.9 g/dL (ref 31.5–35.7)
MCV: 86 fL (ref 79–97)
Monocytes Absolute: 0.5 10*3/uL (ref 0.1–0.9)
Monocytes: 6 %
Neutrophils Absolute: 5.6 10*3/uL (ref 1.4–7.0)
Neutrophils: 65 %
Platelets: 297 10*3/uL (ref 150–450)
RBC: 5.18 x10E6/uL (ref 3.77–5.28)
RDW: 14.3 % (ref 11.7–15.4)
WBC: 8.6 10*3/uL (ref 3.4–10.8)

## 2020-09-25 LAB — COMPREHENSIVE METABOLIC PANEL
ALT: 18 IU/L (ref 0–32)
AST: 17 IU/L (ref 0–40)
Albumin/Globulin Ratio: 1.8 (ref 1.2–2.2)
Albumin: 4.1 g/dL (ref 3.8–4.8)
Alkaline Phosphatase: 115 IU/L (ref 44–121)
BUN/Creatinine Ratio: 16 (ref 12–28)
BUN: 13 mg/dL (ref 8–27)
Bilirubin Total: 0.3 mg/dL (ref 0.0–1.2)
CO2: 23 mmol/L (ref 20–29)
Calcium: 9.8 mg/dL (ref 8.7–10.3)
Chloride: 106 mmol/L (ref 96–106)
Creatinine, Ser: 0.81 mg/dL (ref 0.57–1.00)
GFR calc Af Amer: 89 mL/min/{1.73_m2} (ref >59)
GFR calc non Af Amer: 77 mL/min/{1.73_m2} (ref >59)
Globulin, Total: 2.3 g/dL (ref 1.5–4.5)
Glucose: 87 mg/dL (ref 65–99)
Potassium: 4.6 mmol/L (ref 3.5–5.2)
Sodium: 143 mmol/L (ref 134–144)
Total Protein: 6.4 g/dL (ref 6.0–8.5)

## 2020-09-25 LAB — LIPID PANEL
Chol/HDL Ratio: 6.9 ratio — ABNORMAL HIGH (ref 0.0–4.4)
Cholesterol, Total: 281 mg/dL — ABNORMAL HIGH (ref 100–199)
HDL: 41 mg/dL (ref >39)
LDL Chol Calc (NIH): 218 mg/dL — ABNORMAL HIGH (ref 0–99)
Triglycerides: 121 mg/dL (ref 0–149)
VLDL Cholesterol Cal: 22 mg/dL (ref 5–40)

## 2020-09-25 LAB — CARDIOVASCULAR RISK ASSESSMENT

## 2020-09-25 LAB — VITAMIN D 25 HYDROXY (VIT D DEFICIENCY, FRACTURES): Vit D, 25-Hydroxy: 26 ng/mL — ABNORMAL LOW (ref 30.0–100.0)

## 2020-09-25 LAB — TSH: TSH: 0.054 u[IU]/mL — ABNORMAL LOW (ref 0.450–4.500)

## 2020-09-25 MED ORDER — LEVOTHYROXINE SODIUM 175 MCG PO TABS: 175.0000 ug | ORAL_TABLET | Freq: Every day | ORAL | 2 refills | Status: DC

## 2020-10-29 ENCOUNTER — Ambulatory Visit: Payer: BC Managed Care – PPO | Admitting: Physician Assistant

## 2020-11-01 ENCOUNTER — Other Ambulatory Visit: Payer: Self-pay | Admitting: Physician Assistant

## 2020-11-18 ENCOUNTER — Other Ambulatory Visit: Payer: BC Managed Care – PPO

## 2020-11-19 ENCOUNTER — Other Ambulatory Visit: Payer: BC Managed Care – PPO

## 2020-11-21 ENCOUNTER — Other Ambulatory Visit: Payer: Self-pay

## 2020-11-21 ENCOUNTER — Other Ambulatory Visit: Payer: BC Managed Care – PPO

## 2020-11-21 DIAGNOSIS — E038 Other specified hypothyroidism: Secondary | ICD-10-CM | POA: Diagnosis not present

## 2020-11-22 LAB — TSH: TSH: 1.92 u[IU]/mL (ref 0.450–4.500)

## 2020-12-12 ENCOUNTER — Encounter: Payer: Self-pay | Admitting: Physician Assistant

## 2021-01-06 DIAGNOSIS — N390 Urinary tract infection, site not specified: Secondary | ICD-10-CM | POA: Diagnosis not present

## 2021-01-22 ENCOUNTER — Other Ambulatory Visit: Payer: Self-pay | Admitting: Physician Assistant

## 2021-04-18 ENCOUNTER — Other Ambulatory Visit: Payer: Self-pay | Admitting: Physician Assistant

## 2021-04-18 NOTE — Telephone Encounter (Signed)
Call pt - overdue for follow up  Will give 30 days of meds

## 2021-04-24 ENCOUNTER — Encounter: Payer: Self-pay | Admitting: Physician Assistant

## 2021-04-24 ENCOUNTER — Telehealth (INDEPENDENT_AMBULATORY_CARE_PROVIDER_SITE_OTHER): Payer: BC Managed Care – PPO | Admitting: Physician Assistant

## 2021-04-24 VITALS — BP 164/84 | HR 98 | Temp 97.5°F | Ht 68.0 in | Wt 273.0 lb

## 2021-04-24 DIAGNOSIS — R079 Chest pain, unspecified: Secondary | ICD-10-CM | POA: Diagnosis not present

## 2021-04-24 DIAGNOSIS — R0789 Other chest pain: Secondary | ICD-10-CM

## 2021-04-24 DIAGNOSIS — R0602 Shortness of breath: Secondary | ICD-10-CM | POA: Diagnosis not present

## 2021-04-24 DIAGNOSIS — E785 Hyperlipidemia, unspecified: Secondary | ICD-10-CM | POA: Diagnosis not present

## 2021-04-24 DIAGNOSIS — E038 Other specified hypothyroidism: Secondary | ICD-10-CM | POA: Diagnosis not present

## 2021-04-24 DIAGNOSIS — I10 Essential (primary) hypertension: Secondary | ICD-10-CM

## 2021-04-24 DIAGNOSIS — R0981 Nasal congestion: Secondary | ICD-10-CM

## 2021-04-24 HISTORY — DX: Other chest pain: R07.89

## 2021-04-24 HISTORY — DX: Nasal congestion: R09.81

## 2021-04-24 LAB — POC COVID19 BINAXNOW: SARS Coronavirus 2 Ag: NEGATIVE

## 2021-04-24 NOTE — Progress Notes (Signed)
Acute Office Visit  Subjective:    Patient ID: Kathleen [name], female    DOB: 1956-03-01, 65 y.o.   MRN: [id number]  Chief Complaint  Patient presents with   Nasal Congestion    Cough, body aches Chest pain    HPI Patient is in today for congestion and mild cough - had been exposed to covid Denies headache, malaise, fever  However upon further questioning pt states that she has been having nausea with eating recently and 'for awhile' having episodes of her chest burning - seems to notice more with walking States last episode last night - happened for several minutes - states she actually has been having these type symptoms for several months Pt does have a history of hypertension but states she has not been taking her medication regularly  and did not take today  Past Medical History:  Diagnosis Date   Arthritis    Cancer ([place])    Chronic back pain    Complication of anesthesia    Woke up during  Hysterectomy   Coronary artery disease 2012   Mild to Moderate   Family history of adverse reaction to anesthesia    GERD (gastroesophageal reflux disease)    omeprazole prn   Heart murmur    "nothing to be concerned about   History of blood transfusion    Hx of thyroid cancer    Hyperlipidemia    Hypertension    Hypothyroidism    Obesity    PONV (postoperative nausea and vomiting)    with thyroid surgery   Shortness of breath dyspnea    with exertion   Sleep apnea, obstructive    possible   Tobacco abuse     Past Surgical History:  Procedure Laterality Date   ABDOMINAL HYSTERECTOMY  1999   CARDIAC CATHETERIZATION  Feb 2012   LAD: 60% mid (FFR ratio 0.83), LCX: 30-40%, RCA 20%.    KNEE ARTHROPLASTY Right 05/01/2016   Procedure:  TOTAL KNEE ARTHROPLASTY;  Surgeon: Marybelle Killings, MD;  Location: [facility];  Service: Orthopedics;  Laterality: Right;   RIGHT/LEFT HEART CATH AND CORONARY ANGIOGRAPHY Bilateral 01/17/2018   Procedure: RIGHT/LEFT HEART CATH AND CORONARY  ANGIOGRAPHY;  Surgeon: Wellington Hampshire, MD;  Location: [facility];  Service: Cardiovascular;  Laterality: Bilateral;   THYROIDECTOMY  2000    Family History  Problem Relation Age of Onset   Diabetes Mother     Social History   Socioeconomic History   Marital status: Divorced    Spouse name: Not on file   Number of children: Not on file   Years of education: Not on file   Highest education level: Not on file  Occupational History   Not on file  Tobacco Use   Smoking status: Former    Packs/day: 0.25    Years: 20.00    Pack years: 5.00    Types: Cigarettes   Smokeless tobacco: Never   Tobacco comments:    quit 2012  Vaping Use   Vaping Use: Never used  Substance and Sexual Activity   Alcohol use: No    Alcohol/week: 3.0 standard drinks    Types: 3 Glasses of wine per week   Drug use: No   Sexual activity: Not on file  Other Topics Concern   Not on file  Social History Narrative   Not on file   Social Determinants of Health   Financial Resource Strain: Not on file  Food Insecurity: Not on file  Transportation Needs: Not on file  Physical Activity: Not on file  Stress: Not on file  Social Connections: Not on file  Intimate Partner Violence: Not on file    Outpatient Medications Prior to Visit  Medication Sig Dispense Refill   amLODipine (NORVASC) 10 MG tablet TAKE 1 TABLET BY MOUTH ONCE DAILY 30 tablet 5   aspirin 81 MG EC tablet Take 81 mg by mouth daily.  0   busPIRone (BUSPAR) 15 MG tablet TAKE ONE (1) TABLET BY MOUTH TWO (2) TIMES DAILY 60 tablet 0   carvedilol (COREG) 25 MG tablet Take 25 mg by mouth 2 (two) times daily with a meal.     furosemide (LASIX) 40 MG tablet Take 1 tablet (40 mg total) by mouth daily. 30 tablet    levothyroxine (SYNTHROID) 175 MCG tablet Take 1 tablet (175 mcg total) by mouth daily before breakfast. 30 tablet 2   lisinopril (ZESTRIL) 40 MG tablet TAKE 1 TABLET BY MOUTH ONCE DAILY. 30 tablet 0   omeprazole (PRILOSEC)  40 MG capsule TAKE 1 CAPSULE BY MOUTH TWICE DAILY. 60 capsule 0   oxybutynin (DITROPAN-XL) 10 MG 24 hr tablet Take 1 tablet (10 mg total) by mouth at bedtime. 30 tablet 2   phentermine (ADIPEX-P) 37.5 MG tablet Take 1 tablet (37.5 mg total) by mouth daily before breakfast. 30 tablet 0   rosuvastatin (CRESTOR) 20 MG tablet Take 1 tablet (20 mg total) by mouth daily. 90 tablet 1   sertraline (ZOLOFT) 100 MG tablet TAKE 1 TABLET BY MOUTH IN THE MORNING AND AT BEDTIME. 60 tablet 0   traMADol (ULTRAM-ER) 100 MG 24 hr tablet TAKE 1 TABLET BY MOUTH DAILY AS NEEDED FOR PAIN. 30 tablet 0   Vitamin D, Ergocalciferol, (DRISDOL) 1.25 MG (50000 UNIT) CAPS capsule Take 1 capsule (50,000 Units total) by mouth every 7 (seven) days. 5 capsule 2   No facility-administered medications prior to visit.    Allergies  Allergen Reactions   Hydrocodone-Acetaminophen Other (See Comments)    Headache, allergy to hydrocodone Headache, allergy to hydrocodone    Latex Hives   Loracarbef Other (See Comments)   Penicillins Other (See Comments)    Syncope Has patient had a PCN reaction causing immediate rash, facial/tongue/throat swelling, SOB or lightheadedness with hypotension: Unknown Has patient had a PCN reaction causing severe rash involving mucus membranes or skin necrosis: No Has patient had a PCN reaction that required hospitalization: No Has patient had a PCN reaction occurring within the last 10 years: Unknown If all of the above answers are "NO", then may proceed with Cephalosporin use.  Syncope Has patient had a PCN reaction causing immediate rash, facial/tongue/throat swelling, SOB or lightheadedness with hypotension: Unknown Has patient had a PCN reaction causing severe rash involving mucus membranes or skin necrosis: No Has patient had a PCN reaction that required hospitalization: No Has patient had a PCN reaction occurring within the last 10 years: Unknown If all of the above answers are "NO", then  may proceed with Cephalosporin use.   Hydrocodone-Acetaminophen Other (See Comments)    Headache, allergy to hydrocodone    Review of Systems CONSTITUTIONAL: Negative for chills, fatigue, fever, unintentional weight gain and unintentional weight loss.  E/N/T: Negative for ear pain, nasal congestion and sore throat.  CARDIOVASCULAR: see HPI RESPIRATORY: see HPI  GASTROINTESTINAL: see HPI MSK: Negative for arthralgias and myalgias.  INTEGUMENTARY: Negative for rash.  NEUROLOGICAL: Negative for dizziness and headaches.  PSYCHIATRIC: Negative for sleep disturbance and to question depression  screen.  Negative for depression, negative for anhedonia.       Objective:    Physical Exam PHYSICAL EXAM:   VS: BP (!) 164/84   Pulse 98   Temp (!) 97.5 F (36.4 C) (Temporal)   Ht 5\' 8"  (1.727 m)   Wt 273 lb (123.8 kg)   SpO2 94%   BMI 41.51 kg/m   GEN: Well nourished, well developed, in no acute distress - morbidly obese HEENT: normal external ears and nose - normal external auditory canals and TMS -  - Lips, Teeth and Gums - normal  Oropharynx - normal mucosa, palate, and posterior pharynx Cardiac: RRR; no murmurs, rubs, or gallops,no edema -  Respiratory:  normal respiratory rate and pattern with no distress - normal breath sounds with no rales, rhonchi, wheezes or rubs GI: normal bowel sounds, no masses or tenderness Skin: warm and dry, no rash  Psych: euthymic mood, appropriate affect and demeanor EKG - no acute changes but t wave changes from prior EKG and LVH noted Video Visit on 04/24/2021  Component Date Value Ref Range Status   SARS Coronavirus 2 Ag 04/24/2021 Negative  Negative Final    BP (!) 164/84   Pulse 98   Temp (!) 97.5 F (36.4 C) (Temporal)   Ht 5\' 8"  (1.727 m)   Wt 273 lb (123.8 kg)   SpO2 94%   BMI 41.51 kg/m  Wt Readings from Last 3 Encounters:  04/24/21 273 lb (123.8 kg)  09/24/20 273 lb (123.8 kg)  03/27/20 272 lb (123.4 kg)    Health Maintenance  Due  Topic Date Due   FOOT EXAM  Never done   HIV Screening  Never done   Hepatitis C Screening  Never done   TETANUS/TDAP  Never done   MAMMOGRAM  Never done   Fecal DNA (Cologuard)  Never done   Zoster Vaccines- Shingrix (1 of 2) Never done   HEMOGLOBIN A1C  09/27/2020   COVID-19 Vaccine (3 - Booster for Pfizer series) 11/28/2020    There are no preventive care reminders to display for this patient.   Lab Results  Component Value Date   TSH 1.920 11/21/2020   Lab Results  Component Value Date   WBC 8.6 09/24/2020   HGB 15.0 09/24/2020   HCT 44.3 09/24/2020   MCV 86 09/24/2020   PLT 297 09/24/2020   Lab Results  Component Value Date   NA 143 09/24/2020   K 4.6 09/24/2020   CO2 23 09/24/2020   GLUCOSE 87 09/24/2020   BUN 13 09/24/2020   CREATININE 0.81 09/24/2020   BILITOT 0.3 09/24/2020   ALKPHOS 115 09/24/2020   AST 17 09/24/2020   ALT 18 09/24/2020   PROT 6.4 09/24/2020   ALBUMIN 4.1 09/24/2020   CALCIUM 9.8 09/24/2020   ANIONGAP 9 01/10/2018   Lab Results  Component Value Date   CHOL 281 (H) 09/24/2020   Lab Results  Component Value Date   HDL 41 09/24/2020   Lab Results  Component Value Date   LDLCALC 218 (H) 09/24/2020   Lab Results  Component Value Date   TRIG 121 09/24/2020   Lab Results  Component Value Date   CHOLHDL 6.9 (H) 09/24/2020   Lab Results  Component Value Date   HGBA1C 5.5 03/27/2020       Assessment & Plan:  1. Other chest pain - EKG 12-Lead - DG Chest 2 View Sent for stat labwork including troponin --- if negative will refer to cardiologist  Dr Fletcher Anon 2. Nasal congestion - POC COVID-19 BinaxNow Recommend claritin/ use flonase 3. Essential hypertension  Take meds as directed!  No orders of the defined types were placed in this encounter.   Orders Placed This Encounter  Procedures   DG Chest 2 View   POC COVID-19 BinaxNow   EKG 12-Lead      Follow-up: Return in about 2 weeks (around 05/08/2021).  An After  Visit Summary was printed and given to the patient.  Yetta Flock [facility] [phone]

## 2021-04-25 ENCOUNTER — Telehealth: Payer: Self-pay | Admitting: Cardiovascular Disease

## 2021-04-25 ENCOUNTER — Other Ambulatory Visit: Payer: Self-pay | Admitting: Physician Assistant

## 2021-04-25 NOTE — Telephone Encounter (Signed)
Patient had recent acute visit with pcp for  Burning feeling when inhaling pain radiated to left side of back  Fatigue  Abnormal ekg  Abnormal labs ( elevated troponin )   Patient was advised to go to the hospital for eval but patient declined and wants Dr. Fletcher Anon to advise.   Scheduled per request 7/15 with [facility] .  Added to waitlist    Patient states she will go to ED if that's what Dr. Fletcher Anon feels is best .

## 2021-04-25 NOTE — Telephone Encounter (Signed)
Called the patient. Patient was last seen in 2019. Adv the patient that Dr. Fletcher Anon is out of the office until next week.  Adv the patient that based on her reported symptoms, elevated troponin, and changes in her EKG, I agree with her pcp recommendation for her to go to the ED for evaluation.  Adv the patient that there is concern for acute cardiac event.  Patient sts that she will head to the ED asap as recommended. She is scheduled with Dr.Arida on 7/15. Adv her that she should keep that appt. Patient voiced appreciation for the call back.

## 2021-04-28 ENCOUNTER — Other Ambulatory Visit: Payer: Self-pay | Admitting: Physician Assistant

## 2021-04-28 DIAGNOSIS — E038 Other specified hypothyroidism: Secondary | ICD-10-CM

## 2021-04-28 MED ORDER — LISINOPRIL 40 MG PO TABS: 40.0000 mg | ORAL_TABLET | Freq: Every day | ORAL | 0 refills | Status: DC

## 2021-04-28 MED ORDER — BUSPIRONE HCL 15 MG PO TABS: 15.0000 mg | ORAL_TABLET | Freq: Two times a day (BID) | ORAL | 0 refills | Status: DC

## 2021-04-28 MED ORDER — SYNTHROID 125 MCG PO TABS: 125.0000 ug | ORAL_TABLET | Freq: Every day | ORAL | 1 refills | Status: DC

## 2021-04-28 MED ORDER — SERTRALINE HCL 100 MG PO TABS: 100.0000 mg | ORAL_TABLET | Freq: Two times a day (BID) | ORAL | 0 refills | Status: DC

## 2021-04-28 MED ORDER — OMEPRAZOLE 40 MG PO CPDR: 40.0000 mg | DELAYED_RELEASE_CAPSULE | Freq: Two times a day (BID) | ORAL | 0 refills | Status: DC

## 2021-05-08 ENCOUNTER — Ambulatory Visit: Payer: BC Managed Care – PPO | Admitting: Physician Assistant

## 2021-05-16 ENCOUNTER — Ambulatory Visit: Payer: BC Managed Care – PPO | Admitting: Cardiovascular Disease

## 2021-05-20 ENCOUNTER — Ambulatory Visit: Payer: BC Managed Care – PPO | Admitting: Physician Assistant

## 2021-05-21 ENCOUNTER — Other Ambulatory Visit: Payer: Self-pay | Admitting: Physician Assistant

## 2021-06-05 ENCOUNTER — Ambulatory Visit: Payer: BC Managed Care – PPO | Admitting: Physician Assistant

## 2021-06-16 ENCOUNTER — Other Ambulatory Visit: Payer: Self-pay

## 2021-06-17 ENCOUNTER — Other Ambulatory Visit: Payer: Self-pay | Admitting: Physician Assistant

## 2021-06-17 DIAGNOSIS — E782 Mixed hyperlipidemia: Secondary | ICD-10-CM

## 2021-06-17 DIAGNOSIS — I10 Essential (primary) hypertension: Secondary | ICD-10-CM

## 2021-06-17 DIAGNOSIS — E559 Vitamin D deficiency, unspecified: Secondary | ICD-10-CM

## 2021-06-17 DIAGNOSIS — E038 Other specified hypothyroidism: Secondary | ICD-10-CM

## 2021-06-18 LAB — COMPREHENSIVE METABOLIC PANEL
ALT: 19 IU/L (ref 0–32)
AST: 20 IU/L (ref 0–40)
Albumin/Globulin Ratio: 1.7 (ref 1.2–2.2)
Albumin: 4.3 g/dL (ref 3.8–4.8)
Alkaline Phosphatase: 110 IU/L (ref 44–121)
BUN/Creatinine Ratio: 15 (ref 12–28)
BUN: 13 mg/dL (ref 8–27)
Bilirubin Total: 0.3 mg/dL (ref 0.0–1.2)
CO2: 20 mmol/L (ref 20–29)
Calcium: 10.2 mg/dL (ref 8.7–10.3)
Chloride: 105 mmol/L (ref 96–106)
Creatinine, Ser: 0.88 mg/dL (ref 0.57–1.00)
Globulin, Total: 2.6 g/dL (ref 1.5–4.5)
Glucose: 132 mg/dL — ABNORMAL HIGH (ref 65–99)
Potassium: 3.9 mmol/L (ref 3.5–5.2)
Sodium: 143 mmol/L (ref 134–144)
Total Protein: 6.9 g/dL (ref 6.0–8.5)
eGFR: 73 mL/min/{1.73_m2} (ref >59)

## 2021-06-18 LAB — CBC WITH DIFFERENTIAL/PLATELET
Basophils Absolute: 0.1 10*3/uL (ref 0.0–0.2)
Basos: 1 %
EOS (ABSOLUTE): 0.3 10*3/uL (ref 0.0–0.4)
Eos: 3 %
Hematocrit: 48.3 % — ABNORMAL HIGH (ref 34.0–46.6)
Hemoglobin: 15.7 g/dL (ref 11.1–15.9)
Immature Grans (Abs): 0 10*3/uL (ref 0.0–0.1)
Immature Granulocytes: 0 %
Lymphocytes Absolute: 2.4 10*3/uL (ref 0.7–3.1)
Lymphs: 22 %
MCH: 28.5 pg (ref 26.6–33.0)
MCHC: 32.5 g/dL (ref 31.5–35.7)
MCV: 88 fL (ref 79–97)
Monocytes Absolute: 0.6 10*3/uL (ref 0.1–0.9)
Monocytes: 5 %
Neutrophils Absolute: 7.6 10*3/uL — ABNORMAL HIGH (ref 1.4–7.0)
Neutrophils: 69 %
Platelets: 336 10*3/uL (ref 150–450)
RBC: 5.5 x10E6/uL — ABNORMAL HIGH (ref 3.77–5.28)
RDW: 13.6 % (ref 11.7–15.4)
WBC: 10.9 10*3/uL — ABNORMAL HIGH (ref 3.4–10.8)

## 2021-06-18 LAB — LIPID PANEL
Chol/HDL Ratio: 5.5 ratio — ABNORMAL HIGH (ref 0.0–4.4)
Cholesterol, Total: 226 mg/dL — ABNORMAL HIGH (ref 100–199)
HDL: 41 mg/dL (ref >39)
LDL Chol Calc (NIH): 154 mg/dL — ABNORMAL HIGH (ref 0–99)
Triglycerides: 168 mg/dL — ABNORMAL HIGH (ref 0–149)
VLDL Cholesterol Cal: 31 mg/dL (ref 5–40)

## 2021-06-18 LAB — CARDIOVASCULAR RISK ASSESSMENT

## 2021-06-18 LAB — VITAMIN D 25 HYDROXY (VIT D DEFICIENCY, FRACTURES): Vit D, 25-Hydroxy: 21.8 ng/mL — ABNORMAL LOW (ref 30.0–100.0)

## 2021-06-18 LAB — TSH: TSH: <0.005 u[IU]/mL — ABNORMAL LOW (ref 0.450–4.500)

## 2021-06-19 ENCOUNTER — Encounter: Payer: Self-pay | Admitting: Physician Assistant

## 2021-06-19 ENCOUNTER — Ambulatory Visit (INDEPENDENT_AMBULATORY_CARE_PROVIDER_SITE_OTHER): Payer: Medicare Other | Admitting: Physician Assistant

## 2021-06-19 ENCOUNTER — Other Ambulatory Visit: Payer: Self-pay

## 2021-06-19 VITALS — BP 132/82 | Temp 97.6°F | Ht 68.0 in | Wt 277.4 lb

## 2021-06-19 DIAGNOSIS — E559 Vitamin D deficiency, unspecified: Secondary | ICD-10-CM

## 2021-06-19 DIAGNOSIS — N958 Other specified menopausal and perimenopausal disorders: Secondary | ICD-10-CM

## 2021-06-19 DIAGNOSIS — M170 Bilateral primary osteoarthritis of knee: Secondary | ICD-10-CM

## 2021-06-19 DIAGNOSIS — G8929 Other chronic pain: Secondary | ICD-10-CM

## 2021-06-19 DIAGNOSIS — I10 Essential (primary) hypertension: Secondary | ICD-10-CM

## 2021-06-19 DIAGNOSIS — K219 Gastro-esophageal reflux disease without esophagitis: Secondary | ICD-10-CM

## 2021-06-19 DIAGNOSIS — Z1211 Encounter for screening for malignant neoplasm of colon: Secondary | ICD-10-CM

## 2021-06-19 DIAGNOSIS — R739 Hyperglycemia, unspecified: Secondary | ICD-10-CM

## 2021-06-19 DIAGNOSIS — E038 Other specified hypothyroidism: Secondary | ICD-10-CM | POA: Diagnosis not present

## 2021-06-19 DIAGNOSIS — Z1231 Encounter for screening mammogram for malignant neoplasm of breast: Secondary | ICD-10-CM

## 2021-06-19 DIAGNOSIS — E782 Mixed hyperlipidemia: Secondary | ICD-10-CM

## 2021-06-19 DIAGNOSIS — R0789 Other chest pain: Secondary | ICD-10-CM

## 2021-06-19 DIAGNOSIS — E039 Hypothyroidism, unspecified: Secondary | ICD-10-CM

## 2021-06-19 DIAGNOSIS — M25561 Pain in right knee: Secondary | ICD-10-CM

## 2021-06-19 HISTORY — DX: Essential (primary) hypertension: I10

## 2021-06-19 HISTORY — DX: Hyperglycemia, unspecified: R73.9

## 2021-06-19 MED ORDER — SYNTHROID 175 MCG PO TABS: 175.0000 ug | ORAL_TABLET | Freq: Every day | ORAL | 2 refills | Status: DC

## 2021-06-19 MED ORDER — VITAMIN D (ERGOCALCIFEROL) 1.25 MG (50000 UNIT) PO CAPS: ORAL_CAPSULE | ORAL | 5 refills | Status: DC

## 2021-06-19 MED ORDER — TRAMADOL HCL ER 100 MG PO TB24: 100.0000 mg | ORAL_TABLET | Freq: Every day | ORAL | 0 refills | Status: DC | PRN

## 2021-06-19 NOTE — Progress Notes (Signed)
Established Patient Office Visit  Subjective:  Patient ID: Kathleen [name], female    DOB: [birth year]  Age: 65 y.o. MRN: [id number]  CC:  Chief Complaint  Patient presents with   chronic fasting     HPI Kathleen [name] presents for follow up of chronic medical problems -  Pt with history of hypertension and CHF - pt was on norvasc and carvedilol and has stopped both of these medications on her own - is only now taking lisinopril and lasix - she states bp at home has been good and that is why she stopped the meds Patient was last seen in our office about 2 months ago and was having dyspnea and chest discomfort - she had a troponin which was in gray zone and was advised by our office and her cardiologist to go to ED for further evaluation - she never went --- she has had a few appointments scheduled with Dr Fletcher Anon and did not make any of those and now is not scheduled until 8/25 with him in [place] - she states she wants to now be scheduled here in town with another cardiologist instead because of distance Pt denies chest pain today but continues with intermittent dyspnea  Pt with history of hypothyroidism - patient has not been taking prescribed dose of synthroid and another physician (with personal relation to pt) prescribed 232mg for her instead of the recommended dose and she has been taking that for the past 2 months -- pt came in for labwork and her TSH is extremely low --- she states that her prior endocrinologist wanted her thyroid suppressed that much and that is why she is self medicating I told pt that in no way the other provider should be prescribing this or any other medication since she is not her patient and that if that continued our office could no longer treat patient Patient agrees to see endocrinologist for their recommendations and I will schedule an appt In meantime she is to go back down to 1742m qd of Synthroid and recheck labwork in 8 weeks  Pt has history of  chronic pain in her right knee after having knee replacement surgery years ago - states she has been prescribed tramadol in the past for her pain and requests refill - says only uses med sporadically and has not had a refill in several months  Pt with history of GERD - is taking prilosec qd which controls her symptoms - denies any GERD symptoms  Pt has history of anxiety with mild depression - she has been prescribed zoloft 10094m po qd and has been on this dose for several years - is stable on the medication  Pt with history of hyperlipidemia - she was supposed to be on crestor 34m42m but states she has not been taking this medication - recent labwork shows her total chol, trig and LDL elevated - she agrees to restart medication  Recent labwork showed glucose elevated - hgb a1c has been added to her labwork  Pt would like to be scheduled for mammogram and dexascan Pt would like cologuard ordered --- had been in past but never received by [facility] like refill of medication Past Medical History:  Diagnosis Date   Arthritis    Cancer (HCC)[place] Chronic back pain    Complication of anesthesia    Woke up during  Hysterectomy   Coronary artery disease 2012   Mild to Moderate   Family  history of adverse reaction to anesthesia    GERD (gastroesophageal reflux disease)    omeprazole prn   Heart murmur    "nothing to be concerned about   History of blood transfusion    Hx of thyroid cancer    Hyperlipidemia    Hypertension    Hypothyroidism    Obesity    PONV (postoperative nausea and vomiting)    with thyroid surgery   Shortness of breath dyspnea    with exertion   Sleep apnea, obstructive    possible   Tobacco abuse     Past Surgical History:  Procedure Laterality Date   ABDOMINAL HYSTERECTOMY  1999   CARDIAC CATHETERIZATION  Feb 2012   LAD: 60% mid (FFR ratio 0.83), LCX: 30-40%, RCA 20%.    KNEE ARTHROPLASTY Right 05/01/2016   Procedure:  TOTAL KNEE  ARTHROPLASTY;  Surgeon: Marybelle Killings, MD;  Location: [facility];  Service: Orthopedics;  Laterality: Right;   RIGHT/LEFT HEART CATH AND CORONARY ANGIOGRAPHY Bilateral 01/17/2018   Procedure: RIGHT/LEFT HEART CATH AND CORONARY ANGIOGRAPHY;  Surgeon: Wellington Hampshire, MD;  Location: [facility];  Service: Cardiovascular;  Laterality: Bilateral;   THYROIDECTOMY  2000    Family History  Problem Relation Age of Onset   Diabetes Mother     Social History   Socioeconomic History   Marital status: Divorced    Spouse name: Not on file   Number of children: Not on file   Years of education: Not on file   Highest education level: Not on file  Occupational History   Not on file  Tobacco Use   Smoking status: Former    Packs/day: 0.25    Years: 20.00    Pack years: 5.00    Types: Cigarettes   Smokeless tobacco: Never   Tobacco comments:    quit 2012  Vaping Use   Vaping Use: Never used  Substance and Sexual Activity   Alcohol use: No    Alcohol/week: 3.0 standard drinks    Types: 3 Glasses of wine per week   Drug use: No   Sexual activity: Not on file  Other Topics Concern   Not on file  Social History Narrative   Not on file   Social Determinants of Health   Financial Resource Strain: Not on file  Food Insecurity: Not on file  Transportation Needs: Not on file  Physical Activity: Not on file  Stress: Not on file  Social Connections: Not on file  Intimate Partner Violence: Not on file     Current Outpatient Medications:    aspirin 81 MG EC tablet, Take 81 mg by mouth daily., Disp: , Rfl: 0   busPIRone (BUSPAR) 15 MG tablet, Take 1 tablet (15 mg total) by mouth 2 (two) times daily., Disp: 60 tablet, Rfl: 0   furosemide (LASIX) 40 MG tablet, Take 1 tablet (40 mg total) by mouth daily., Disp: 30 tablet, Rfl:    lisinopril (ZESTRIL) 40 MG tablet, Take 1 tablet (40 mg total) by mouth daily., Disp: 30 tablet, Rfl: 0   omeprazole (PRILOSEC) 40 MG capsule, Take 1 capsule  (40 mg total) by mouth 2 (two) times daily., Disp: 60 capsule, Rfl: 0   oxybutynin (DITROPAN-XL) 10 MG 24 hr tablet, Take 1 tablet (10 mg total) by mouth at bedtime., Disp: 30 tablet, Rfl: 2   rosuvastatin (CRESTOR) 20 MG tablet, Take 1 tablet (20 mg total) by mouth daily., Disp: 90 tablet, Rfl: 1   sertraline (ZOLOFT)  100 MG tablet, Take 1 tablet (100 mg total) by mouth 2 (two) times daily., Disp: 60 tablet, Rfl: 0   SYNTHROID 175 MCG tablet, Take 1 tablet (175 mcg total) by mouth daily before breakfast., Disp: 30 tablet, Rfl: 2   traMADol (ULTRAM-ER) 100 MG 24 hr tablet, Take 1 tablet (100 mg total) by mouth daily as needed. for pain, Disp: 30 tablet, Rfl: 0   Vitamin D, Ergocalciferol, (DRISDOL) 1.25 MG (50000 UNIT) CAPS capsule, 1 po twice weekly, Disp: 8 capsule, Rfl: 5   Allergies  Allergen Reactions   Hydrocodone-Acetaminophen Other (See Comments)    Headache, allergy to hydrocodone Headache, allergy to hydrocodone    Latex Hives   Loracarbef Other (See Comments)   Penicillins Other (See Comments)    Syncope Has patient had a PCN reaction causing immediate rash, facial/tongue/throat swelling, SOB or lightheadedness with hypotension: Unknown Has patient had a PCN reaction causing severe rash involving mucus membranes or skin necrosis: No Has patient had a PCN reaction that required hospitalization: No Has patient had a PCN reaction occurring within the last 10 years: Unknown If all of the above answers are "NO", then may proceed with Cephalosporin use.  Syncope Has patient had a PCN reaction causing immediate rash, facial/tongue/throat swelling, SOB or lightheadedness with hypotension: Unknown Has patient had a PCN reaction causing severe rash involving mucus membranes or skin necrosis: No Has patient had a PCN reaction that required hospitalization: No Has patient had a PCN reaction occurring within the last 10 years: Unknown If all of the above answers are "NO", then may proceed  with Cephalosporin use.   Hydrocodone-Acetaminophen Other (See Comments)    Headache, allergy to hydrocodone    ROS CONSTITUTIONAL: Negative for chills, fatigue, fever, unintentional weight gain and unintentional weight loss.  E/N/T: Negative for ear pain, nasal congestion and sore throat.  CARDIOVASCULAR: Negative for chest pain, dizziness, palpitations and pedal edema.  RESPIRATORY: see HPI GASTROINTESTINAL: Negative for abdominal pain, acid reflux symptoms, constipation, diarrhea, nausea and vomiting.  MSK: Negative for arthralgias and myalgias.  INTEGUMENTARY: Negative for rash.  NEUROLOGICAL: Negative for dizziness and headaches.  PSYCHIATRIC: Negative for sleep disturbance and to question depression screen.  Negative for depression, negative for anhedonia.      Objective:   PHYSICAL EXAM:   VS: BP 132/82   Temp 97.6 F (36.4 C)   Ht 5' 8"  (1.727 m)   Wt 277 lb 6.4 oz (125.8 kg)   BMI 42.18 kg/m   GEN: Well nourished, well developed, in no acute distress   Cardiac: RRR; no murmurs, rubs, or gallops,no edema -  Respiratory:  normal respiratory rate and pattern with no distress - normal breath sounds with no rales, rhonchi, wheezes or rubs  MS: no deformity or atrophy  Skin: warm and dry, no rash  Neuro:  Alert and Oriented x 3 - CN II-Xii grossly intact Psych: euthymic mood, appropriate affect and demeanor     Health Maintenance Due  Topic Date Due   FOOT EXAM  Never done   Hepatitis C Screening  Never done   TETANUS/TDAP  Never done   MAMMOGRAM  Never done   Fecal DNA (Cologuard)  Never done   Zoster Vaccines- Shingrix (1 of 2) Never done   HEMOGLOBIN A1C  09/27/2020   OPHTHALMOLOGY EXAM  05/02/2021   DEXA SCAN  Never done   INFLUENZA VACCINE  06/02/2021   PNA vac Low Risk Adult (1 of 2 - PCV13) 06/04/2021  There are no preventive care reminders to display for this patient.  Lab Results  Component Value Date   TSH <0.005 (L) 06/17/2021   Lab  Results  Component Value Date   WBC 10.9 (H) 06/17/2021   HGB 15.7 06/17/2021   HCT 48.3 (H) 06/17/2021   MCV 88 06/17/2021   PLT 336 06/17/2021   Lab Results  Component Value Date   NA 143 06/17/2021   K 3.9 06/17/2021   CO2 20 06/17/2021   GLUCOSE 132 (H) 06/17/2021   BUN 13 06/17/2021   CREATININE 0.88 06/17/2021   BILITOT 0.3 06/17/2021   ALKPHOS 110 06/17/2021   AST 20 06/17/2021   ALT 19 06/17/2021   PROT 6.9 06/17/2021   ALBUMIN 4.3 06/17/2021   CALCIUM 10.2 06/17/2021   ANIONGAP 9 01/10/2018   EGFR 73 06/17/2021   Lab Results  Component Value Date   CHOL 226 (H) 06/17/2021   Lab Results  Component Value Date   HDL 41 06/17/2021   Lab Results  Component Value Date   LDLCALC 154 (H) 06/17/2021   Lab Results  Component Value Date   TRIG 168 (H) 06/17/2021   Lab Results  Component Value Date   CHOLHDL 5.5 (H) 06/17/2021   Lab Results  Component Value Date   HGBA1C 5.5 03/27/2020      Assessment & Plan:   Problem List Items Addressed This Visit       Cardiovascular and Mediastinum   Primary hypertension - Primary   Relevant Orders   Ambulatory referral to Cardiology Continue meds     Digestive   GERD (gastroesophageal reflux disease) Continue meds     Endocrine   Other specified hypothyroidism   Relevant Medications   SYNTHROID 175 MCG tablet Repeat TSH in 8 weeks Referral to endocrinology     Musculoskeletal and Integument   Primary osteoarthritis of knee   Relevant Medications   traMADol (ULTRAM-ER) 100 MG 24 hr tablet     Other   Mixed hyperlipidemia   Relevant Orders   Ambulatory referral to Cardiology Restart crestor 38m qd      Vitamin D insufficiency   Relevant Medications   Vitamin D, Ergocalciferol, (DRISDOL) 1.25 MG (50000 UNIT) CAPS capsule Increase dose to twice weekly   Other chest pain   Relevant Orders   Ambulatory referral to Cardiology   Hyperglycemia   Relevant Orders   Hemoglobin A1c   Other Visit  Diagnoses     Other specified menopausal and perimenopausal disorders       Relevant Orders   DG Bone Density   Encounter for screening mammogram for breast cancer       Relevant Orders   MM DIGITAL SCREENING BILATERAL   Colon cancer screening       Relevant Orders   Cologuard   Acquired hypothyroidism       Relevant Medications   SYNTHROID 175 MCG tablet   Other Relevant Orders   Ambulatory referral to Endocrinology        Meds ordered this encounter  Medications   Vitamin D, Ergocalciferol, (DRISDOL) 1.25 MG (50000 UNIT) CAPS capsule    Sig: 1 po twice weekly    Dispense:  8 capsule    Refill:  5    Order Specific Question:   Supervising Provider    Answer:   COX, KElnita Maxwell[[[id number]]  traMADol (ULTRAM-ER) 100 MG 24 hr tablet    Sig: Take 1 tablet (100 mg total) by mouth daily as needed.  for pain    Dispense:  30 tablet    Refill:  0    Order Specific Question:   Supervising Provider    Answer:   COX, KIRSTEN [[id number]]   SYNTHROID 175 MCG tablet    Sig: Take 1 tablet (175 mcg total) by mouth daily before breakfast.    Dispense:  30 tablet    Refill:  2    Name brand only    Order Specific Question:   Supervising Provider    AnswerShelton Silvas     Follow-up: Return in about 3 months (around 09/19/2021) for chronic fasting and in 8 weeks for TSH.    SARA R Juniel Groene, PA-Ch

## 2021-06-20 ENCOUNTER — Ambulatory Visit: Payer: Medicare Other

## 2021-06-23 ENCOUNTER — Telehealth: Payer: Self-pay | Admitting: Physician Assistant

## 2021-06-23 NOTE — Telephone Encounter (Signed)
   Kathleen [name] has been scheduled for the following appointment:  WHAT: MAMMOGRAM AND BONE DENSITY WHERE: [facility] DATE: 08/15/21 TIME: 9:15 AM ARRIVAL  Patient has been made aware.

## 2021-06-24 LAB — SPECIMEN STATUS REPORT

## 2021-06-24 LAB — HGB A1C W/O EAG: Hgb A1c MFr Bld: 5.8 % — ABNORMAL HIGH (ref 4.8–5.6)

## 2021-06-26 ENCOUNTER — Ambulatory Visit: Payer: BC Managed Care – PPO | Admitting: Cardiovascular Disease

## 2021-07-31 ENCOUNTER — Other Ambulatory Visit: Payer: Self-pay

## 2021-07-31 DIAGNOSIS — T8859XA Other complications of anesthesia, initial encounter: Secondary | ICD-10-CM | POA: Insufficient documentation

## 2021-07-31 DIAGNOSIS — C801 Malignant (primary) neoplasm, unspecified: Secondary | ICD-10-CM | POA: Insufficient documentation

## 2021-07-31 DIAGNOSIS — Z9289 Personal history of other medical treatment: Secondary | ICD-10-CM | POA: Insufficient documentation

## 2021-07-31 DIAGNOSIS — M1990 Unspecified osteoarthritis, unspecified site: Secondary | ICD-10-CM | POA: Insufficient documentation

## 2021-07-31 DIAGNOSIS — E785 Hyperlipidemia, unspecified: Secondary | ICD-10-CM | POA: Insufficient documentation

## 2021-07-31 DIAGNOSIS — Z85850 Personal history of malignant neoplasm of thyroid: Secondary | ICD-10-CM | POA: Insufficient documentation

## 2021-07-31 DIAGNOSIS — M549 Dorsalgia, unspecified: Secondary | ICD-10-CM | POA: Insufficient documentation

## 2021-07-31 DIAGNOSIS — G4733 Obstructive sleep apnea (adult) (pediatric): Secondary | ICD-10-CM | POA: Insufficient documentation

## 2021-07-31 DIAGNOSIS — G8929 Other chronic pain: Secondary | ICD-10-CM | POA: Insufficient documentation

## 2021-07-31 DIAGNOSIS — Z8489 Family history of other specified conditions: Secondary | ICD-10-CM | POA: Insufficient documentation

## 2021-07-31 DIAGNOSIS — E039 Hypothyroidism, unspecified: Secondary | ICD-10-CM | POA: Insufficient documentation

## 2021-07-31 DIAGNOSIS — R112 Nausea with vomiting, unspecified: Secondary | ICD-10-CM | POA: Insufficient documentation

## 2021-08-04 ENCOUNTER — Other Ambulatory Visit: Payer: Self-pay

## 2021-08-04 DIAGNOSIS — E038 Other specified hypothyroidism: Secondary | ICD-10-CM

## 2021-08-04 MED ORDER — SYNTHROID 175 MCG PO TABS: 175.0000 ug | ORAL_TABLET | Freq: Every day | ORAL | 0 refills | Status: DC

## 2021-08-05 ENCOUNTER — Ambulatory Visit: Payer: Medicare Other | Admitting: Cardiology

## 2021-08-10 ENCOUNTER — Other Ambulatory Visit: Payer: Self-pay | Admitting: Physician Assistant

## 2021-08-10 ENCOUNTER — Other Ambulatory Visit: Payer: Self-pay

## 2021-08-10 DIAGNOSIS — E038 Other specified hypothyroidism: Secondary | ICD-10-CM

## 2021-08-11 ENCOUNTER — Other Ambulatory Visit: Payer: Self-pay | Admitting: Physician Assistant

## 2021-08-11 ENCOUNTER — Telehealth: Payer: Self-pay

## 2021-08-11 DIAGNOSIS — M170 Bilateral primary osteoarthritis of knee: Secondary | ICD-10-CM

## 2021-08-11 MED ORDER — LISINOPRIL 40 MG PO TABS: 40.0000 mg | ORAL_TABLET | Freq: Every day | ORAL | 0 refills | Status: DC

## 2021-08-11 MED ORDER — FUROSEMIDE 40 MG PO TABS: 40.0000 mg | ORAL_TABLET | Freq: Every day | ORAL | Status: DC

## 2021-08-11 MED ORDER — SERTRALINE HCL 100 MG PO TABS: 100.0000 mg | ORAL_TABLET | Freq: Two times a day (BID) | ORAL | 0 refills | Status: DC

## 2021-08-11 MED ORDER — OMEPRAZOLE 40 MG PO CPDR: 40.0000 mg | DELAYED_RELEASE_CAPSULE | Freq: Two times a day (BID) | ORAL | 0 refills | Status: DC

## 2021-08-11 MED ORDER — BUSPIRONE HCL 15 MG PO TABS: 15.0000 mg | ORAL_TABLET | Freq: Two times a day (BID) | ORAL | 0 refills | Status: DC

## 2021-08-11 NOTE — Telephone Encounter (Signed)
Patient is due for labs, need to come in for appointment with Kathleen [name] to refill mediation.

## 2021-08-14 ENCOUNTER — Other Ambulatory Visit: Payer: Medicare Other

## 2021-08-14 ENCOUNTER — Other Ambulatory Visit: Payer: Self-pay | Admitting: Physician Assistant

## 2021-08-14 ENCOUNTER — Other Ambulatory Visit: Payer: Self-pay

## 2021-08-14 DIAGNOSIS — E039 Hypothyroidism, unspecified: Secondary | ICD-10-CM

## 2021-08-15 ENCOUNTER — Other Ambulatory Visit: Payer: Self-pay | Admitting: Physician Assistant

## 2021-08-15 DIAGNOSIS — E039 Hypothyroidism, unspecified: Secondary | ICD-10-CM

## 2021-08-15 LAB — TSH: TSH: 0.269 u[IU]/mL — ABNORMAL LOW (ref 0.450–4.500)

## 2021-08-15 MED ORDER — LEVOTHYROXINE SODIUM 150 MCG PO TABS: 150.0000 ug | ORAL_TABLET | Freq: Every day | ORAL | 2 refills | Status: DC

## 2021-08-28 NOTE — Telephone Encounter (Signed)
MAMMOGRAM AND BONE DENSITY RESCHEDULED FOR 10/29/21

## 2021-09-10 ENCOUNTER — Ambulatory Visit (INDEPENDENT_AMBULATORY_CARE_PROVIDER_SITE_OTHER): Payer: Medicare Other

## 2021-09-10 ENCOUNTER — Encounter: Payer: Self-pay | Admitting: Physician Assistant

## 2021-09-10 DIAGNOSIS — Z23 Encounter for immunization: Secondary | ICD-10-CM | POA: Diagnosis not present

## 2021-09-12 ENCOUNTER — Ambulatory Visit: Payer: Medicare Other | Admitting: Cardiology

## 2021-09-25 LAB — COLOGUARD: COLOGUARD: NEGATIVE

## 2021-10-05 ENCOUNTER — Other Ambulatory Visit: Payer: Self-pay | Admitting: Physician Assistant

## 2021-10-05 DIAGNOSIS — M170 Bilateral primary osteoarthritis of knee: Secondary | ICD-10-CM

## 2021-10-06 MED ORDER — LISINOPRIL 40 MG PO TABS: 40.0000 mg | ORAL_TABLET | Freq: Every day | ORAL | 0 refills | Status: DC

## 2021-10-06 MED ORDER — OMEPRAZOLE 40 MG PO CPDR: 40.0000 mg | DELAYED_RELEASE_CAPSULE | Freq: Two times a day (BID) | ORAL | 0 refills | Status: DC

## 2021-10-06 MED ORDER — FUROSEMIDE 40 MG PO TABS: 40.0000 mg | ORAL_TABLET | Freq: Every day | ORAL | 0 refills | Status: DC

## 2021-10-06 MED ORDER — TRAMADOL HCL ER 100 MG PO TB24: 100.0000 mg | ORAL_TABLET | Freq: Every day | ORAL | 0 refills | Status: DC | PRN

## 2021-10-06 MED ORDER — BUSPIRONE HCL 15 MG PO TABS: 15.0000 mg | ORAL_TABLET | Freq: Two times a day (BID) | ORAL | 0 refills | Status: DC

## 2021-10-08 NOTE — Telephone Encounter (Signed)
Left detail message for patient to call office to make fasting appointment.

## 2021-10-09 ENCOUNTER — Ambulatory Visit: Payer: Medicare Other | Admitting: Physician Assistant

## 2021-10-21 ENCOUNTER — Other Ambulatory Visit: Payer: Self-pay

## 2021-10-21 ENCOUNTER — Encounter: Payer: Self-pay | Admitting: Physician Assistant

## 2021-10-21 ENCOUNTER — Ambulatory Visit (INDEPENDENT_AMBULATORY_CARE_PROVIDER_SITE_OTHER): Payer: Medicare Other | Admitting: Physician Assistant

## 2021-10-21 VITALS — BP 130/84 | HR 90 | Temp 97.2°F | Ht 68.0 in | Wt 276.8 lb

## 2021-10-21 DIAGNOSIS — E039 Hypothyroidism, unspecified: Secondary | ICD-10-CM

## 2021-10-21 DIAGNOSIS — E559 Vitamin D deficiency, unspecified: Secondary | ICD-10-CM

## 2021-10-21 DIAGNOSIS — I10 Essential (primary) hypertension: Secondary | ICD-10-CM | POA: Diagnosis not present

## 2021-10-21 DIAGNOSIS — F418 Other specified anxiety disorders: Secondary | ICD-10-CM

## 2021-10-21 DIAGNOSIS — E782 Mixed hyperlipidemia: Secondary | ICD-10-CM | POA: Diagnosis not present

## 2021-10-21 DIAGNOSIS — R739 Hyperglycemia, unspecified: Secondary | ICD-10-CM

## 2021-10-21 MED ORDER — DULOXETINE HCL 30 MG PO CPEP
30.0000 mg | ORAL_CAPSULE | Freq: Every day | ORAL | 3 refills | Status: DC
Start: 2021-10-21 — End: 2021-11-18

## 2021-10-21 NOTE — Progress Notes (Signed)
Established Patient Office Visit  Subjective:  Patient ID: Kathleen [name], female    DOB: 19-Nov-1955  Age: 65 y.o. MRN: [id number]  CC:  Chief Complaint  Patient presents with   Hypertension    HPI LAURIS KEEPERS presents for follow up of chronic medical problems -  Pt with history of hypertension and CHF - pt was on norvasc and carvedilol and has stopped both of these medications on her own - is only now taking lisinopril and lasix - she states bp at home has been good and that is why she stopped the meds Patient was last seen in our office several months ago and was having dyspnea and chest discomfort - she had a troponin which was in gray zone and was advised by our office and her cardiologist to go to ED for further evaluation - she never went --- she has had a few appointments scheduled with Dr Fletcher Anon and did not make any of those and she has been rescheduled with Dr Geraldo Pitter but has not made any of those appointments either and not seeing him until 11/29/21 Pt denies chest pain or dyspnea today  Pt with history of hypothyroidism - patient had been adjusting her own medication and not taking the recommended dose of synthroid 175mg however she states now she is actually taking that dose - she self referred to an endocrinologist within [facility] and they had kept her on this dose as well --- she would still like [facility]  Pt has history of chronic pain in her right knee after having knee replacement surgery years ago - states she uses tramadol prn for pain   Pt with history of GERD - is taking prilosec qd which controls her symptoms - denies any GERD symptoms  Pt has history of anxiety with mild depression - she has been prescribed zoloft 1011m2 po qd and has been on this dose for several years however she states she has not taken this medication in several days - she is having breakthrough crying spells and  anxiety - did discuss with her about trying a different medication and mentioned cymbalta - she states she has been on this in the past and would like to try again  Pt with history of hyperlipidemia - pt states she is taking crestor 2037md - due for labwork  Past Medical History:  Diagnosis Date   Anxiety 12/28/2019   Arthritis    CAD in native artery 04/28/2016   [facility]  Chronic back pain    Chronic pain of right knee 2/29/03/0149Complication of anesthesia    Woke up during  Hysterectomy   Coronary artery disease 11/02/2010   Mild to Moderate   Dyslipidemia 04/28/2016   Encounter for follow-up surveillance of thyroid cancer 06/21/2018   Essential hypertension 04/28/2016   Family history of adverse reaction to anesthesia    GERD (gastroesophageal reflux disease)    omeprazole prn   GERD without esophagitis 12/28/2019   Headache with remote history of traumatic head injury 12/28/2019   History of blood transfusion    Hx of thyroid cancer    Hyperglycemia 06/19/2021   Hyperlipidemia    Hypertension    Hypothyroidism    Mixed hyperlipidemia    Morbid obesity ([facility]25/2021   Murmur 08/03/2012   Nasal congestion 04/24/2021   Need for prophylactic vaccination and inoculation against influenza 09/24/2020   Other chest pain  04/24/2021   Other specified hypothyroidism 03/27/2020   Palpitations 08/03/2012   PONV (postoperative nausea and vomiting)    with thyroid surgery   Preop cardiovascular exam 04/28/2016   Primary hypertension 06/19/2021   Primary osteoarthritis of knee 04/28/2016   Pulmonary hypertension, unspecified ([place])    Retrocalcaneal bone spur 06/01/2017   Sleep apnea, obstructive    possible   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    KNEE ARTHROPLASTY Right 05/01/2016   Procedure:  TOTAL KNEE ARTHROPLASTY;  Surgeon: Marybelle Killings, MD;  Location: [facility];  Service: Orthopedics;  Laterality: Right;   RIGHT/LEFT HEART CATH AND CORONARY ANGIOGRAPHY Bilateral 01/17/2018   Procedure: RIGHT/LEFT HEART CATH AND CORONARY ANGIOGRAPHY;  Surgeon: Wellington Hampshire, MD;  Location: [place] [facility];  Service: Cardiovascular;  Laterality: Bilateral;   THYROIDECTOMY  2000    Family History  Problem Relation Age of Onset   Diabetes Mother     Social History   Socioeconomic History   Marital status: Divorced    Spouse name: Not on file   Number of children: Not on file   Years of education: Not on file   Highest education level: Not on file  Occupational History   Not on file  Tobacco Use   Smoking status: Former    Packs/day: 0.25    Years: 20.00    Pack years: 5.00    Types: Cigarettes   Smokeless tobacco: Never   Tobacco comments:    quit 2012  Vaping Use   Vaping Use: Never used  Substance and Sexual Activity   Alcohol use: No    Alcohol/week: 3.0 standard drinks    Types: 3 Glasses of wine per week   Drug use: No   Sexual activity: Not on file  Other Topics Concern   Not on file  Social History Narrative   Not on file   Social Determinants of Health   Financial Resource Strain: Not on file  Food Insecurity: Not on file  Transportation Needs: Not on file  Physical Activity: Not on file  Stress: Not on file  Social Connections: Not on file  Intimate Partner Violence: Not on file     Current Outpatient Medications:    aspirin 81 MG EC tablet, Take 81 mg by mouth daily., Disp: , Rfl: 0   busPIRone (BUSPAR) 15 MG tablet, Take 1 tablet (15 mg total) by mouth 2 (two) times daily., Disp: 60 tablet, Rfl: 0   DULoxetine (CYMBALTA) 30 MG capsule,  Take 1 capsule (30 mg total) by mouth daily., Disp: 30 capsule, Rfl: 3   furosemide (LASIX) 40 MG tablet, Take 1 tablet (40 mg total) by mouth daily., Disp: 30 tablet, Rfl: 0   levothyroxine (SYNTHROID) 150 MCG tablet, Take 1 tablet (150 mcg total) by mouth daily before breakfast., Disp: 30 tablet, Rfl: 2   lisinopril (ZESTRIL) 40 MG tablet, Take 1 tablet (40 mg total) by mouth daily., Disp: 30 tablet, Rfl: 0   omeprazole (PRILOSEC) 40 MG capsule, Take 1 capsule (40 mg total) by mouth 2 (two) times daily., Disp:  60 capsule, Rfl: 0   oxybutynin (DITROPAN-XL) 10 MG 24 hr tablet, Take 1 tablet (10 mg total) by mouth at bedtime., Disp: 30 tablet, Rfl: 2   rosuvastatin (CRESTOR) 20 MG tablet, Take 1 tablet (20 mg total) by mouth daily., Disp: 90 tablet, Rfl: 1   traMADol (ULTRAM-ER) 100 MG 24 hr tablet, Take 1 tablet (100 mg total) by mouth daily as needed. for pain, Disp: 30 tablet, Rfl: 0   Vitamin D, Ergocalciferol, (DRISDOL) 1.25 MG (50000 UNIT) CAPS capsule, 1 po twice weekly, Disp: 8 capsule, Rfl: 5   Allergies  Allergen Reactions   Hydrocodone-Acetaminophen Other (See Comments)    Headache, allergy to hydrocodone Headache, allergy to hydrocodone    Latex Hives   Loracarbef Other (See Comments)   Penicillins Other (See Comments)    Syncope Has patient had a PCN reaction causing immediate rash, facial/tongue/throat swelling, SOB or lightheadedness with hypotension: Unknown Has patient had a PCN reaction causing severe rash involving mucus membranes or skin necrosis: No Has patient had a PCN reaction that required hospitalization: No Has patient had a PCN reaction occurring within the last 10 years: Unknown If all of the above answers are "NO", then may proceed with Cephalosporin use.  Syncope Has patient had a PCN reaction causing immediate rash, facial/tongue/throat swelling, SOB or lightheadedness with hypotension: Unknown Has patient had a PCN reaction causing severe rash involving  mucus membranes or skin necrosis: No Has patient had a PCN reaction that required hospitalization: No Has patient had a PCN reaction occurring within the last 10 years: Unknown If all of the above answers are "NO", then may proceed with Cephalosporin use.   Hydrocodone-Acetaminophen Other (See Comments)    Headache, allergy to hydrocodone    CONSTITUTIONAL: Negative for chills, fatigue, fever, unintentional weight gain and unintentional weight loss.  E/N/T: Negative for ear pain, nasal congestion and sore throat.  CARDIOVASCULAR: see HPI RESPIRATORY: Negative for recent cough and dyspnea.  GASTROINTESTINAL: Negative for abdominal pain, acid reflux symptoms, constipation, diarrhea, nausea and vomiting.  MSK:see HPI INTEGUMENTARY: Negative for rash.  NEUROLOGICAL: Negative for dizziness and headaches.  PSYCHIATRIC: see HPI   Objective:  PHYSICAL EXAM:   VS: BP 130/84 (BP Location: Left Arm, Patient Position: Sitting, Cuff Size: Large)    Pulse 90    Temp (!) 97.2 F (36.2 C) (Temporal)    Ht 5' 8"  (1.727 m)    Wt 276 lb 12.8 oz (125.6 kg)    SpO2 95%    BMI 42.09 kg/m   GEN: Well nourished, well developed, in no acute distress  Cardiac: RRR; no murmurs, rubs, or gallops,no edema  Respiratory:  normal respiratory rate and pattern with no distress - normal breath sounds with no rales, rhonchi, wheezes or rubs MS: no deformity or atrophy  Skin: warm and dry, no rash  Psych: euthymic mood, appropriate affect and demeanor  Diabetic Foot Exam - Simple   Simple Foot Form Visual Inspection No deformities, no ulcerations, no other skin breakdown bilaterally: Yes Sensation Testing Intact to touch and monofilament testing bilaterally: Yes Pulse Check Posterior Tibialis and Dorsalis pulse intact bilaterally: Yes Comments     Health Maintenance Due  Topic Date Due   FOOT EXAM  Never done   MAMMOGRAM  Never done   OPHTHALMOLOGY EXAM  05/02/2021   DEXA SCAN  Never done    There  are no preventive care reminders to display for this patient.  Lab Results  Component Value Date   TSH  0.269 (L) 08/14/2021   Lab Results  Component Value Date   WBC 10.9 (H) 06/17/2021   HGB 15.7 06/17/2021   HCT 48.3 (H) 06/17/2021   MCV 88 06/17/2021   PLT 336 06/17/2021   Lab Results  Component Value Date   NA 143 06/17/2021   K 3.9 06/17/2021   CO2 20 06/17/2021   GLUCOSE 132 (H) 06/17/2021   BUN 13 06/17/2021   CREATININE 0.88 06/17/2021   BILITOT 0.3 06/17/2021   ALKPHOS 110 06/17/2021   AST 20 06/17/2021   ALT 19 06/17/2021   PROT 6.9 06/17/2021   ALBUMIN 4.3 06/17/2021   CALCIUM 10.2 06/17/2021   ANIONGAP 9 01/10/2018   EGFR 73 06/17/2021   Lab Results  Component Value Date   CHOL 226 (H) 06/17/2021   Lab Results  Component Value Date   HDL 41 06/17/2021   Lab Results  Component Value Date   LDLCALC 154 (H) 06/17/2021   Lab Results  Component Value Date   TRIG 168 (H) 06/17/2021   Lab Results  Component Value Date   CHOLHDL 5.5 (H) 06/17/2021   Lab Results  Component Value Date   HGBA1C 5.8 (H) 06/17/2021      Assessment & Plan:   Problem List Items Addressed This Visit       Cardiovascular and Mediastinum   Primary hypertension - Primary   Relevant Orders   Follow up with cardiology as directed Continue meds     Digestive   GERD (gastroesophageal reflux disease) Continue meds     Endocrine   Other specified hypothyroidism   Relevant Medications   Continue current meds TSH pending Referral to endocrinology     Musculoskeletal and Integument   Primary osteoarthritis of knee   Relevant Medications   traMADol (ULTRAM-ER) 100 MG 24 hr tablet     Other   Mixed hyperlipidemia   Relevant Orders   Ambulatory referral to Cardiology crestor 45m qd      Vitamin D insufficiency   Relevant Medications   Vitamin D, Ergocalciferol, (DRISDOL) 1.25 MG (50000 UNIT) CAPS capsule             Hyperglycemia   Relevant Orders    Hemoglobin A1c   Other Visit Diagnoses     Other specified menopausal and perimenopausal disorders       Relevant Orders   DG Bone Density   Encounter for screening mammogram for breast cancer       Relevant Orders   MM DIGITAL SCREENING BILATERAL   Depression with anxiety Stop zoloft (pt already stopped) Rx for cymbalta 317mqd                             Meds ordered this encounter  Medications   DULoxetine (CYMBALTA) 30 MG capsule    Sig: Take 1 capsule (30 mg total) by mouth daily.    Dispense:  30 capsule    Refill:  3    Order Specific Question:   Supervising Provider    Answer:   COShelton Silvas   Follow-up: Return in about 3 months (around 01/19/2022) for chronic fasting follow up.    SARA R Elorah Dewing, PA-Ch

## 2021-10-22 LAB — COMPREHENSIVE METABOLIC PANEL
ALT: 19 IU/L (ref 0–32)
AST: 16 IU/L (ref 0–40)
Albumin/Globulin Ratio: 1.8 (ref 1.2–2.2)
Albumin: 4.2 g/dL (ref 3.8–4.8)
Alkaline Phosphatase: 101 IU/L (ref 44–121)
BUN/Creatinine Ratio: 14 (ref 12–28)
BUN: 12 mg/dL (ref 8–27)
Bilirubin Total: 0.4 mg/dL (ref 0.0–1.2)
CO2: 22 mmol/L (ref 20–29)
Calcium: 10 mg/dL (ref 8.7–10.3)
Chloride: 107 mmol/L — ABNORMAL HIGH (ref 96–106)
Creatinine, Ser: 0.84 mg/dL (ref 0.57–1.00)
Globulin, Total: 2.3 g/dL (ref 1.5–4.5)
Glucose: 100 mg/dL — ABNORMAL HIGH (ref 70–99)
Potassium: 4.6 mmol/L (ref 3.5–5.2)
Sodium: 142 mmol/L (ref 134–144)
Total Protein: 6.5 g/dL (ref 6.0–8.5)
eGFR: 77 mL/min/{1.73_m2} (ref >59)

## 2021-10-22 LAB — CBC WITH DIFFERENTIAL/PLATELET
Basophils Absolute: 0 10*3/uL (ref 0.0–0.2)
Basos: 1 %
EOS (ABSOLUTE): 0.3 10*3/uL (ref 0.0–0.4)
Eos: 4 %
Hematocrit: 45.9 % (ref 34.0–46.6)
Hemoglobin: 15.4 g/dL (ref 11.1–15.9)
Immature Grans (Abs): 0 10*3/uL (ref 0.0–0.1)
Immature Granulocytes: 0 %
Lymphocytes Absolute: 2.2 10*3/uL (ref 0.7–3.1)
Lymphs: 27 %
MCH: 28.9 pg (ref 26.6–33.0)
MCHC: 33.6 g/dL (ref 31.5–35.7)
MCV: 86 fL (ref 79–97)
Monocytes Absolute: 0.6 10*3/uL (ref 0.1–0.9)
Monocytes: 7 %
Neutrophils Absolute: 4.9 10*3/uL (ref 1.4–7.0)
Neutrophils: 61 %
Platelets: 278 10*3/uL (ref 150–450)
RBC: 5.32 x10E6/uL — ABNORMAL HIGH (ref 3.77–5.28)
RDW: 13.4 % (ref 11.7–15.4)
WBC: 8 10*3/uL (ref 3.4–10.8)

## 2021-10-22 LAB — LIPID PANEL
Chol/HDL Ratio: 5.9 ratio — ABNORMAL HIGH (ref 0.0–4.4)
Cholesterol, Total: 249 mg/dL — ABNORMAL HIGH (ref 100–199)
HDL: 42 mg/dL (ref >39)
LDL Chol Calc (NIH): 183 mg/dL — ABNORMAL HIGH (ref 0–99)
Triglycerides: 130 mg/dL (ref 0–149)
VLDL Cholesterol Cal: 24 mg/dL (ref 5–40)

## 2021-10-22 LAB — VITAMIN D 25 HYDROXY (VIT D DEFICIENCY, FRACTURES): Vit D, 25-Hydroxy: 40.5 ng/mL (ref 30.0–100.0)

## 2021-10-22 LAB — CARDIOVASCULAR RISK ASSESSMENT

## 2021-10-22 LAB — HEMOGLOBIN A1C
Est. average glucose Bld gHb Est-mCnc: 114 mg/dL
Hgb A1c MFr Bld: 5.6 % (ref 4.8–5.6)

## 2021-10-22 LAB — TSH: TSH: 0.469 u[IU]/mL (ref 0.450–4.500)

## 2021-10-28 ENCOUNTER — Ambulatory Visit
Admission: RE | Admit: 2021-10-28 | Discharge: 2021-10-28 | Disposition: A | Discharge status: Home / Self Care | Payer: Medicare Other | Source: Ambulatory Visit | Attending: Physician Assistant | Admitting: Physician Assistant

## 2021-10-29 ENCOUNTER — Ambulatory Visit: Payer: Medicare Other | Admitting: Cardiology

## 2021-11-17 ENCOUNTER — Other Ambulatory Visit: Payer: Self-pay | Admitting: Physician Assistant

## 2021-11-17 DIAGNOSIS — M170 Bilateral primary osteoarthritis of knee: Secondary | ICD-10-CM

## 2021-11-18 ENCOUNTER — Other Ambulatory Visit: Payer: Self-pay | Admitting: Physician Assistant

## 2021-11-18 ENCOUNTER — Telehealth: Payer: Self-pay

## 2021-11-18 MED ORDER — DULOXETINE HCL 60 MG PO CPEP: 60.0000 mg | ORAL_CAPSULE | Freq: Every day | ORAL | 2 refills | Status: DC

## 2021-11-18 MED ORDER — TRAMADOL HCL ER 100 MG PO TB24: 100.0000 mg | ORAL_TABLET | Freq: Every day | ORAL | 0 refills | Status: DC | PRN

## 2021-11-18 MED ORDER — OMEPRAZOLE 40 MG PO CPDR: 40.0000 mg | DELAYED_RELEASE_CAPSULE | Freq: Two times a day (BID) | ORAL | 2 refills | Status: DC

## 2021-11-18 MED ORDER — LISINOPRIL 40 MG PO TABS: 40.0000 mg | ORAL_TABLET | Freq: Every day | ORAL | 2 refills | Status: DC

## 2021-11-18 MED ORDER — FUROSEMIDE 40 MG PO TABS: 40.0000 mg | ORAL_TABLET | Freq: Every day | ORAL | 2 refills | Status: DC

## 2021-11-18 MED ORDER — BUSPIRONE HCL 15 MG PO TABS: 15.0000 mg | ORAL_TABLET | Freq: Two times a day (BID) | ORAL | 2 refills | Status: DC

## 2021-11-18 NOTE — Telephone Encounter (Signed)
Will increase to 60mg  qd - rx sent

## 2021-11-18 NOTE — Telephone Encounter (Signed)
Patient calling as she is ending first month on cymbalta. She feels when she started it helped great. Has not cried since office visit and did help with pain. But pain has been increasing and has been "a little weepy at crazy things." She feels if it was increased slightly she would be set.   Please advise next steps.   Royce Macadamia, [place] 11/18/21 11:45 AM

## 2021-11-18 NOTE — Telephone Encounter (Signed)
Made pt aware.   Kathleen [name], [place] 11/18/21 3:11 PM

## 2021-11-27 ENCOUNTER — Ambulatory Visit: Payer: Medicare Other | Admitting: Cardiology

## 2021-12-22 ENCOUNTER — Other Ambulatory Visit: Payer: Self-pay

## 2021-12-22 ENCOUNTER — Other Ambulatory Visit: Payer: Self-pay | Admitting: Physician Assistant

## 2021-12-22 DIAGNOSIS — E039 Hypothyroidism, unspecified: Secondary | ICD-10-CM

## 2021-12-22 DIAGNOSIS — M170 Bilateral primary osteoarthritis of knee: Secondary | ICD-10-CM

## 2021-12-22 MED ORDER — LEVOTHYROXINE SODIUM 150 MCG PO TABS: 150.0000 ug | ORAL_TABLET | Freq: Every day | ORAL | 2 refills | Status: DC

## 2021-12-22 NOTE — Telephone Encounter (Signed)
Notify pt that tramadol ER 100mg  is the lowest dose The 50 mg is not an extended release medication Which did she want to do

## 2021-12-22 NOTE — Telephone Encounter (Signed)
Requesting tramadol 50 mg, thinks 100 mg is too strong.

## 2021-12-23 ENCOUNTER — Other Ambulatory Visit: Payer: Self-pay | Admitting: Physician Assistant

## 2021-12-23 MED ORDER — TRAMADOL HCL 50 MG PO TABS: 50.0000 mg | ORAL_TABLET | Freq: Every day | ORAL | 0 refills | Status: DC | PRN

## 2021-12-23 NOTE — Telephone Encounter (Signed)
She would like the 50 mg tramadol.   Royce Macadamia, [place] 12/23/21 12:05 PM

## 2022-01-06 ENCOUNTER — Encounter: Payer: Self-pay | Admitting: Cardiology

## 2022-01-06 ENCOUNTER — Ambulatory Visit: Payer: Medicare Other | Admitting: Cardiology

## 2022-01-06 ENCOUNTER — Other Ambulatory Visit: Payer: Self-pay

## 2022-01-06 VITALS — BP 174/80 | HR 77 | Ht 68.0 in | Wt 268.8 lb

## 2022-01-06 DIAGNOSIS — I2510 Atherosclerotic heart disease of native coronary artery without angina pectoris: Secondary | ICD-10-CM | POA: Diagnosis not present

## 2022-01-06 DIAGNOSIS — I10 Essential (primary) hypertension: Secondary | ICD-10-CM | POA: Diagnosis not present

## 2022-01-06 DIAGNOSIS — E782 Mixed hyperlipidemia: Secondary | ICD-10-CM | POA: Diagnosis not present

## 2022-01-06 MED ORDER — NITROGLYCERIN 0.4 MG SL SUBL: 0.4000 mg | SUBLINGUAL_TABLET | SUBLINGUAL | 6 refills | Status: DC | PRN

## 2022-01-06 NOTE — Progress Notes (Signed)
Cardiology Office Note:    Date:  01/06/2022   ID:  Kathleen [name], DOB 02/07/1956, MRN [id number]  PCP:  Marge Duncans, PA-C  Cardiologist:  Jenean Lindau, MD   Referring MD: Marge Duncans, PA-C    ASSESSMENT:    1. Coronary artery disease involving native coronary artery of native heart without angina pectoris   2. Essential hypertension   3. Mixed hyperlipidemia   4. Morbid obesity ([place])    PLAN:    In order of problems listed above:  Coronary artery disease: Coronary angiography report reviewed.  Secondary prevention stressed to the patient.  Importance of compliance with diet medication stressed and she vocalized understanding.  She was advised to walk to the best of her ability.  Echocardiogram will be done to assess murmur heard on auscultation. Essential hypertension: Blood pressure stable and diet was emphasized.  She tells me that her blood pressures are better at home and that she took a caffeine drink this morning.  She was advised to check her blood pressures 2-3 times a day and send [place] a log in a week.  She works for a [employer].  Salt intake issues and lifestyle modification urged. Mixed dyslipidemia: Markedly elevated lipids.  We will check Chem-7 and liver panel and double her statin.  Diet was emphasized. Morbid obesity: Weight reduction stressed risks of obesity explained and she promises to do better. Patient will be seen in follow-up appointment in 6 months or earlier if the patient has any concerns    Medication Adjustments/Labs and Tests Ordered: Current medicines are reviewed at length with the patient today.  Concerns regarding medicines are outlined above.  No orders of the defined types were placed in this encounter.  No orders of the defined types were placed in this encounter.    History of Present Illness:    Kathleen [name] is a 66 y.o. female who is being seen today for the evaluation of coronary artery disease and to be established at the  request of Marge Duncans, [place].  Patient is a pleasant 66 year old female.  She has past medical history of coronary artery disease, essential hypertension, dyslipidemia and obesity.  She leads a sedentary lifestyle.  She is here to be established.  She denies any chest pain orthopnea or PND.  At the time of my evaluation, the patient is alert awake oriented and in no distress.  Past Medical History:  Diagnosis Date   Anxiety 12/28/2019   Arthritis    CAD in native artery 04/28/2016   Cancer (HCC)    Chronic back pain    Chronic pain of right knee 9/52/8413   Complication of anesthesia    Woke up during  Hysterectomy   Coronary artery disease 11/02/2010   Mild to Moderate   Dyslipidemia 04/28/2016   Encounter for follow-up surveillance of thyroid cancer 06/21/2018   Essential hypertension 04/28/2016   Family history of adverse reaction to anesthesia    GERD (gastroesophageal reflux disease)    omeprazole prn   GERD without esophagitis 12/28/2019   Headache with remote history of traumatic head injury 12/28/2019   History of blood transfusion    Hx of thyroid cancer    Hyperglycemia 06/19/2021   Hyperlipidemia    Hypertension    Hypothyroidism    Mixed hyperlipidemia    Morbid obesity ([place]) 12/28/2019   Murmur 08/03/2012   Nasal congestion 04/24/2021   Need for prophylactic vaccination and inoculation against influenza 09/24/2020   Other chest pain  04/24/2021   Other specified hypothyroidism 03/27/2020   Palpitations 08/03/2012   PONV (postoperative nausea and vomiting)    with thyroid surgery   Preop cardiovascular exam 04/28/2016   Primary hypertension 06/19/2021   Primary osteoarthritis of knee 04/28/2016   Pulmonary hypertension, unspecified ([place])    Retrocalcaneal bone spur 06/01/2017   Sleep apnea, obstructive    possible   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/01/2016   Tendonitis, Achilles, left 06/01/2017   Unstable angina ([place]) 01/11/2018   Added automatically from request for  surgery [id number]   Visit for screening mammogram 04/15/2015   Vitamin D insufficiency 03/27/2020    Past Surgical History:  Procedure Laterality Date   ABDOMINAL HYSTERECTOMY  1999   CARDIAC CATHETERIZATION  Feb 2012   LAD: 60% mid (FFR ratio 0.83), LCX: 30-40%, RCA 20%.    KNEE ARTHROPLASTY Right 05/01/2016   Procedure:  TOTAL KNEE ARTHROPLASTY;  Surgeon: Marybelle Killings, MD;  Location: [facility];  Service: Orthopedics;  Laterality: Right;   RIGHT/LEFT HEART CATH AND CORONARY ANGIOGRAPHY Bilateral 01/17/2018   Procedure: RIGHT/LEFT HEART CATH AND CORONARY ANGIOGRAPHY;  Surgeon: Wellington Hampshire, MD;  Location: [facility];  Service: Cardiovascular;  Laterality: Bilateral;   THYROIDECTOMY  2000    Current Medications: Current Meds  Medication Sig   aspirin 81 MG EC tablet Take 81 mg by mouth daily.   busPIRone (BUSPAR) 15 MG tablet Take 1 tablet (15 mg total) by mouth 2 (two) times daily.   DULoxetine (CYMBALTA) 60 MG capsule Take 1 capsule (60 mg total) by mouth daily.   furosemide (LASIX) 40 MG tablet Take 1 tablet (40 mg total) by mouth daily.   levothyroxine (SYNTHROID) 150 MCG tablet Take 1 tablet (150 mcg total) by mouth daily before breakfast.   lisinopril (ZESTRIL) 40 MG tablet Take 1 tablet (40 mg total) by mouth daily.   omeprazole (PRILOSEC) 40 MG capsule Take 1 capsule (40 mg total) by mouth 2 (two) times daily.   oxybutynin (DITROPAN-XL) 10 MG 24 hr tablet Take 1 tablet (10 mg total) by mouth at bedtime.   rosuvastatin (CRESTOR) 20 MG tablet Take 1 tablet (20 mg total) by mouth daily.   traMADol (ULTRAM) 50 MG tablet Take 1 tablet (50 mg total) by mouth daily as needed.   Vitamin D, Ergocalciferol, (DRISDOL) 1.25 MG (50000 UNIT) CAPS capsule 1 po twice weekly     Allergies:   Hydrocodone-acetaminophen, Latex, Loracarbef, Penicillins, and Hydrocodone-acetaminophen   Social History   Socioeconomic History   Marital status: Divorced    Spouse name: Not on file   Number  of children: Not on file   Years of education: Not on file   Highest education level: Not on file  Occupational History   Not on file  Tobacco Use   Smoking status: Former    Packs/day: 0.25    Years: 20.00    Pack years: 5.00    Types: Cigarettes   Smokeless tobacco: Never   Tobacco comments:    quit 2012  Vaping Use   Vaping Use: Never used  Substance and Sexual Activity   Alcohol use: No    Alcohol/week: 3.0 standard drinks    Types: 3 Glasses of wine per week   Drug use: No   Sexual activity: Not on file  Other Topics Concern   Not on file  Social History Narrative   Not on file   Social Determinants of Health   Financial Resource Strain: Not on  file  Food Insecurity: Not on file  Transportation Needs: Not on file  Physical Activity: Not on file  Stress: Not on file  Social Connections: Not on file     Family History: The patient's family history includes Diabetes in her mother.  ROS:   Please see the history of present illness.    All other systems reviewed and are negative.  EKGs/Labs/Other Studies Reviewed:    The following studies were reviewed today: EKG reveals sinus rhythm and nonspecific ST-T changes   Recent Labs: 10/21/2021: ALT 19; BUN 12; Creatinine, Ser 0.84; Hemoglobin 15.4; Platelets 278; Potassium 4.6; Sodium 142; TSH 0.469  Recent Lipid Panel    Component Value Date/Time   CHOL 249 (H) 10/21/2021 0948   TRIG 130 10/21/2021 0948   HDL 42 10/21/2021 0948   CHOLHDL 5.9 (H) 10/21/2021 0948   LDLCALC 183 (H) 10/21/2021 0948    Physical Exam:    VS:  BP (!) 174/80    Pulse 77    Ht '5\' 8"'$  (1.727 m)    Wt 268 lb 12.8 oz (121.9 kg)    SpO2 96%    BMI 40.87 kg/m     Wt Readings from Last 3 Encounters:  01/06/22 268 lb 12.8 oz (121.9 kg)  10/21/21 276 lb 12.8 oz (125.6 kg)  06/19/21 277 lb 6.4 oz (125.8 kg)     GEN: Patient is in no acute distress HEENT: Normal NECK: No JVD; No carotid bruits LYMPHATICS: No  lymphadenopathy CARDIAC: S1 S2 regular, 2/6 systolic murmur at the apex. RESPIRATORY:  Clear to auscultation without rales, wheezing or rhonchi  ABDOMEN: Soft, non-tender, non-distended MUSCULOSKELETAL:  No edema; No deformity  SKIN: Warm and dry NEUROLOGIC:  Alert and oriented x 3 PSYCHIATRIC:  Normal affect    Signed, Jenean Lindau, MD  01/06/2022 11:09 AM    [place]

## 2022-01-06 NOTE — Patient Instructions (Signed)
Medication Instructions:  Your physician has recommended you make the following change in your medication:   Use nitroglycerin 1 tablet placed under the tongue at the first sign of chest pain or an angina attack. 1 tablet may be used every 5 minutes as needed, for up to 15 minutes. Do not take more than 3 tablets in 15 minutes. If pain persist call 911 or go to the nearest ED.   *If you need a refill on your cardiac medications before your next appointment, please call your pharmacy*   Lab Work: Your physician recommends that you have labs done in the office today. Your test included  basic metabolic panel, and liver function.  If you have labs (blood work) drawn today and your tests are completely normal, you will receive your results only by: [place] (if you have MyChart) OR A paper copy in the mail If you have any lab test that is abnormal or we need to change your treatment, we will call you to review the results.   Testing/Procedures: Your physician has requested that you have an echocardiogram. Echocardiography is a painless test that uses sound waves to create images of your heart. It provides your doctor with information about the size and shape of your heart and how well your hearts chambers and valves are working. This procedure takes approximately one hour. There are no restrictions for this procedure.    Follow-Up: At [facility], you and your health needs are our priority.  As part of our continuing mission to provide you with exceptional heart care, we have created designated Provider Care Teams.  These Care Teams include your primary Cardiologist (physician) and Advanced Practice Providers (APPs -  Physician Assistants and Nurse Practitioners) who all work together to provide you with the care you need, when you need it.  We recommend signing up for the patient portal called "MyChart".  Sign up information is provided on this After Visit Summary.  MyChart is used  to connect with patients for Virtual Visits (Telemedicine).  Patients are able to view lab/test results, encounter notes, upcoming appointments, etc.  Non-urgent messages can be sent to your provider as well.   To learn more about what you can do with MyChart, go to [email or web address].    Your next appointment:   9 month(s)  The format for your next appointment:   In Person  Provider:   Jyl Heinz, MD   Other Instructions Echocardiogram An echocardiogram is a test that uses sound waves (ultrasound) to produce images of the heart. Images from an echocardiogram can provide important information about: Heart size and shape. The size and thickness and movement of your heart's walls. Heart muscle function and strength. Heart valve function or if you have stenosis. Stenosis is when the heart valves are too narrow. If blood is flowing backward through the heart valves (regurgitation). A tumor or infectious growth around the heart valves. Areas of heart muscle that are not working well because of poor blood flow or injury from a heart attack. Aneurysm detection. An aneurysm is a weak or damaged part of an artery wall. The wall bulges out from the normal force of blood pumping through the body. Tell a health care provider about: Any allergies you have. All medicines you are taking, including vitamins, herbs, eye drops, creams, and over-the-counter medicines. Any blood disorders you have. Any surgeries you have had. Any medical conditions you have. Whether you are pregnant or may be pregnant. What are the risks? Generally,  this is a safe test. However, problems may occur, including an allergic reaction to dye (contrast) that may be used during the test. What happens before the test? No specific preparation is needed. You may eat and drink normally. What happens during the test? You will take off your clothes from the waist up and put on a hospital gown. Electrodes or  electrocardiogram (ECG)patches may be placed on your chest. The electrodes or patches are then connected to a device that monitors your heart rate and rhythm. You will lie down on a table for an ultrasound exam. A gel will be applied to your chest to help sound waves pass through your skin. A handheld device, called a transducer, will be pressed against your chest and moved over your heart. The transducer produces sound waves that travel to your heart and bounce back (or "echo" back) to the transducer. These sound waves will be captured in real-time and changed into images of your heart that can be viewed on a video monitor. The images will be recorded on a computer and reviewed by your health care provider. You may be asked to change positions or hold your breath for a short time. This makes it easier to get different views or better views of your heart. In some cases, you may receive contrast through an IV in one of your veins. This can improve the quality of the pictures from your heart. The procedure may vary among health care providers and hospitals.   What can I expect after the test? You may return to your normal, everyday life, including diet, activities, and medicines, unless your health care provider tells you not to do that. Follow these instructions at home: It is up to you to get the results of your test. Ask your health care provider, or the department that is doing the test, when your results will be ready. Keep all follow-up visits. This is important. Summary An echocardiogram is a test that uses sound waves (ultrasound) to produce images of the heart. Images from an echocardiogram can provide important information about the size and shape of your heart, heart muscle function, heart valve function, and other possible heart problems. You do not need to do anything to prepare before this test. You may eat and drink normally. After the echocardiogram is completed, you may return to your  normal, everyday life, unless your health care provider tells you not to do that. This information is not intended to replace advice given to you by your health care provider. Make sure you discuss any questions you have with your health care provider. Document Revised: 06/11/2020 Document Reviewed: 06/11/2020 Elsevier Patient Education  2021 [place].   Blood Pressure Record Sheet To take your blood pressure, you will need a blood pressure machine. You can buy a blood pressure machine (blood pressure monitor) at your clinic, drug store, or online. When choosing one, consider: An automatic monitor that has an arm cuff. A cuff that wraps snugly around your upper arm. You should be able to fit only one finger between your arm and the cuff. A device that stores blood pressure reading results. Do not choose a monitor that measures your blood pressure from your wrist or finger. Follow your health care provider's instructions for how to take your blood pressure. To use this form: Get one reading in the morning (a.m.) 1-2 hours after you take any medicines. Get one reading in the evening (p.m.) before supper. Take at least 2 readings with each  blood pressure check. This makes sure the results are correct. Wait 1-2 minutes between measurements. Write down the results in the spaces on this form. Repeat this once a week, or as told by your health care provider.  Make a follow-up appointment with your health care provider to discuss the results. Blood pressure log Date: _______________________ a.m. _____________________(1st reading) HR___________            p.m. _____________________(2nd reading) HR__________  Date: _______________________ a.m. _____________________(1st reading) HR___________            p.m. _____________________(2nd reading) HR__________ Date: _______________________ a.m. _____________________(1st reading) HR___________            p.m. _____________________(2nd reading)  HR__________ Date: _______________________ a.m. _____________________(1st reading) HR___________            p.m. _____________________(2nd reading) HR__________  Date: _______________________ a.m. _____________________(1st reading) HR___________            p.m. _____________________(2nd reading) HR__________  Date: _______________________ a.m. _____________________(1st reading) HR___________            p.m. _____________________(2nd reading) HR__________  Date: _______________________ a.m. _____________________(1st reading) HR___________            p.m. _____________________(2nd reading) HR__________   This information is not intended to replace advice given to you by your health care provider. Make sure you discuss any questions you have with your health care provider. Document Revised: 02/07/2020 Document Reviewed: 02/07/2020 Elsevier Patient Education  2021 [place].  Nitroglycerin Sublingual Tablets What is this medication? NITROGLYCERIN (nye troe GLI ser in) prevents and treats chest pain (angina). It works by relaxing blood vessels, which decreases the amount of work the heart has to do. It belongs to a group of medications called nitrates. This medicine may be used for other purposes; ask your health care provider or pharmacist if you have questions. COMMON BRAND NAME(S): Nitroquick, Nitrostat, Nitrotab What should I tell my care team before I take this medication? They need to know if you have any of these conditions: Anemia Head injury, recent stroke, or bleeding in the brain Liver disease Previous heart attack An unusual or allergic reaction to nitroglycerin, other medications, foods, dyes, or preservatives Pregnant or trying to get pregnant Breast-feeding How should I use this medication? Take this medication by mouth as needed. Use at the first sign of an angina attack (chest pain or tightness). You can also take this medication 5 to 10 minutes before an  event likely to produce chest pain. Follow the directions exactly as written on the prescription label. Place one tablet under your tongue and let it dissolve. Do not swallow whole. Replace the dose if you accidentally swallow it. It will help if your mouth is not dry. Saliva around the tablet will help it to dissolve more quickly. Do not eat or drink, smoke or chew tobacco while a tablet is dissolving. Sit down when taking this medication. In an angina attack, you should feel better within 5 minutes after your first dose. You can take a dose every 5 minutes up to a total of 3 doses. If you do not feel better or feel worse after 1 dose, call 9-1-1 at once. Do not take more than 3 doses in 15 minutes. Your care team might give you other directions. Follow those directions if they do. Do not take your medication more often than directed. Talk to your care team about the use of this medication in children. Special care may be needed. Overdosage: If you think you have  taken too much of this medicine contact a poison control center or emergency room at once. NOTE: This medicine is only for you. Do not share this medicine with others. What if I miss a dose? This does not apply. This medication is only used as needed. What may interact with this medication? Do not take this medication with any of the following: Certain migraine medications like ergotamine and dihydroergotamine (DHE) Medications used to treat erectile dysfunction like sildenafil, tadalafil, and vardenafil Riociguat This medication may also interact with the following: Alteplase Aspirin Heparin Medications for high blood pressure Medications for mental depression Other medications used to treat angina Phenothiazines like chlorpromazine, mesoridazine, prochlorperazine, thioridazine This list may not describe all possible interactions. Give your health care provider a list of all the medicines, herbs, non-prescription drugs, or dietary  supplements you use. Also tell them if you smoke, drink alcohol, or use illegal drugs. Some items may interact with your medicine. What should I watch for while using this medication? Tell your care team if you feel your medication is no longer working. Keep this medication with you at all times. Sit or lie down when you take your medication to prevent falling if you feel dizzy or faint after using it. Try to remain calm. This will help you to feel better faster. If you feel dizzy, take several deep breaths and lie down with your feet propped up, or bend forward with your head resting between your knees. You may get drowsy or dizzy. Do not drive, use machinery, or do anything that needs mental alertness until you know how this medication affects you. Do not stand or sit up quickly, especially if you are an older patient. This reduces the risk of dizzy or fainting spells. Alcohol can make you more drowsy and dizzy. Avoid alcoholic drinks. Do not treat yourself for coughs, colds, or pain while you are taking this medication without asking your care team for advice. Some ingredients may increase your blood pressure. What side effects may I notice from receiving this medication? Side effects that you should report to your care team as soon as possible: Allergic reactions--skin rash, itching, hives, swelling of the face, lips, tongue, or throat Headache, unusual weakness or fatigue, shortness of breath, nausea, vomiting, rapid heartbeat, blue skin or lips, which may be signs of methemoglobinemia Increased pressure around the brain--severe headache, blurry vision, change in vision, nausea, vomiting Low blood pressure--dizziness, feeling faint or lightheaded, blurry vision Slow heartbeat--dizziness, feeling faint or lightheaded, confusion, trouble breathing, unusual weakness or fatigue Worsening chest pain (angina)--pain, pressure, or tightness in the chest, neck, back, or arms Side effects that usually do  not require medical attention (report to your care team if they continue or are bothersome): Dizziness Flushing Headache This list may not describe all possible side effects. Call your doctor for medical advice about side effects. You may report side effects to FDA at 1-800-FDA-1088. Where should I keep my medication? Keep out of the reach of children. Store at room temperature between 20 and 25 degrees C (68 and 77 degrees F). Store in [facility] [place]. Protect from light and moisture. Keep tightly closed. Throw away any unused medication after the expiration date. NOTE: This sheet is a summary. It may not cover all possible information. If you have questions about this medicine, talk to your doctor, pharmacist, or health care provider.  2022 Elsevier/Gold Standard (2021-01-28 00:00:00)

## 2022-01-07 LAB — BASIC METABOLIC PANEL
BUN/Creatinine Ratio: 16 (ref 12–28)
BUN: 12 mg/dL (ref 8–27)
CO2: 21 mmol/L (ref 20–29)
Calcium: 10 mg/dL (ref 8.7–10.3)
Chloride: 105 mmol/L (ref 96–106)
Creatinine, Ser: 0.77 mg/dL (ref 0.57–1.00)
Glucose: 95 mg/dL (ref 70–99)
Potassium: 4 mmol/L (ref 3.5–5.2)
Sodium: 142 mmol/L (ref 134–144)
eGFR: 86 mL/min/{1.73_m2} (ref >59)

## 2022-01-07 LAB — HEPATIC FUNCTION PANEL
ALT: 15 IU/L (ref 0–32)
AST: 16 IU/L (ref 0–40)
Albumin: 4 g/dL (ref 3.8–4.8)
Alkaline Phosphatase: 117 IU/L (ref 44–121)
Bilirubin Total: 0.3 mg/dL (ref 0.0–1.2)
Bilirubin, Direct: <0.1 mg/dL (ref 0.00–0.40)
Total Protein: 6.5 g/dL (ref 6.0–8.5)

## 2022-01-14 ENCOUNTER — Other Ambulatory Visit: Payer: Self-pay

## 2022-01-14 ENCOUNTER — Ambulatory Visit (INDEPENDENT_AMBULATORY_CARE_PROVIDER_SITE_OTHER): Payer: Self-pay

## 2022-01-14 DIAGNOSIS — I2510 Atherosclerotic heart disease of native coronary artery without angina pectoris: Secondary | ICD-10-CM

## 2022-01-14 LAB — ECHOCARDIOGRAM COMPLETE
Area-P 1/2: 2.14 cm2
S' Lateral: 2.3 cm

## 2022-01-20 ENCOUNTER — Ambulatory Visit: Payer: Medicare Other | Admitting: Physician Assistant

## 2022-01-26 ENCOUNTER — Other Ambulatory Visit: Payer: Self-pay | Admitting: Physician Assistant

## 2022-02-28 ENCOUNTER — Other Ambulatory Visit: Payer: Self-pay | Admitting: Physician Assistant

## 2022-03-06 ENCOUNTER — Other Ambulatory Visit: Payer: Self-pay

## 2022-03-10 ENCOUNTER — Encounter: Payer: Self-pay | Admitting: Physician Assistant

## 2022-03-10 ENCOUNTER — Ambulatory Visit (INDEPENDENT_AMBULATORY_CARE_PROVIDER_SITE_OTHER): Payer: Medicare Other | Admitting: Physician Assistant

## 2022-03-10 VITALS — BP 146/76 | HR 98 | Resp 18 | Ht 68.0 in | Wt 269.0 lb

## 2022-03-10 DIAGNOSIS — E782 Mixed hyperlipidemia: Secondary | ICD-10-CM | POA: Diagnosis not present

## 2022-03-10 DIAGNOSIS — E559 Vitamin D deficiency, unspecified: Secondary | ICD-10-CM | POA: Diagnosis not present

## 2022-03-10 DIAGNOSIS — F418 Other specified anxiety disorders: Secondary | ICD-10-CM

## 2022-03-10 DIAGNOSIS — E039 Hypothyroidism, unspecified: Secondary | ICD-10-CM | POA: Diagnosis not present

## 2022-03-10 DIAGNOSIS — I10 Essential (primary) hypertension: Secondary | ICD-10-CM | POA: Diagnosis not present

## 2022-03-10 DIAGNOSIS — E038 Other specified hypothyroidism: Secondary | ICD-10-CM

## 2022-03-10 DIAGNOSIS — R739 Hyperglycemia, unspecified: Secondary | ICD-10-CM

## 2022-03-10 MED ORDER — OXYBUTYNIN CHLORIDE ER 10 MG PO TB24: 10.0000 mg | ORAL_TABLET | Freq: Every day | ORAL | 5 refills | Status: DC

## 2022-03-10 NOTE — Assessment & Plan Note (Signed)
hgb a1c pending ?Watch diet/efforts at weight loss ?

## 2022-03-10 NOTE — Assessment & Plan Note (Signed)
Continue weekly supplement ?labwork pending ?

## 2022-03-10 NOTE — Assessment & Plan Note (Signed)
Continue current meds 

## 2022-03-10 NOTE — Assessment & Plan Note (Signed)
Continue meds ?Watch diet ?labwork pending ?

## 2022-03-10 NOTE — Progress Notes (Signed)
? ?Established Patient Office Visit ? ?Subjective:  ?Patient ID: Kathleen [name], female    DOB: 07-Dec-1955  Age: 66 y.o. MRN: [id number] ? ?CC:  ?Chief Complaint  ?Patient presents with  ? Hypertension  ? ? ?HPI ?Kathleen [name] presents for follow up of chronic medical problems - ? ?Pt with history of hypertension and CHF - pt was on norvasc and carvedilol and has stopped both of these several months ago on her own - is only now taking lisinopril and lasix - she states bp at home has been good and that is why she stopped the meds- bp is elevated today at 146/76 but states she has not taken her medication this morning ?Patient states she has seen cardiology and has follow up appt soon ?Pt denies chest pain or dyspnea today ? ?Pt with history of hypothyroidism - patient is currently taking synthroid 145mg qd - due for labwork ? ?Pt has history of chronic pain in her right knee after having knee replacement surgery years ago - states she uses tramadol prn for pain  ? ?Pt with history of GERD - is taking prilosec qd which controls her symptoms - denies any GERD symptoms ? ?Pt has history of anxiety with mild depression - she states she is doing well on her current meds of buspar and cymbalta - is having some family stressors but otherwise doing well ? ?Pt with history of hyperlipidemia - pt states she is taking crestor '20mg'$  qd - due for labwork ? ?Pt with history of Vit D deficiency - due for labwork - is taking weekly supplement ? ?Past Medical History:  ?Diagnosis Date  ? Anxiety 12/28/2019  ? Arthritis   ? CAD in native artery 04/28/2016  ? Cancer ([facility]   ? Chronic back pain   ? Chronic pain of right knee 12/28/2019  ? Complication of anesthesia   ? Woke up during  Hysterectomy  ? Coronary artery disease 11/02/2010  ? Mild to Moderate  ? Dyslipidemia 04/28/2016  ? Encounter for follow-up surveillance of thyroid cancer 06/21/2018  ? Essential hypertension 04/28/2016  ? Family history of adverse reaction to anesthesia   ?  GERD (gastroesophageal reflux disease)   ? omeprazole prn  ? GERD without esophagitis 12/28/2019  ? Headache with remote history of traumatic head injury 12/28/2019  ? History of blood transfusion   ? Hx of thyroid cancer   ? Hyperglycemia 06/19/2021  ? Hyperlipidemia   ? Hypertension   ? Hypothyroidism   ? Mixed hyperlipidemia   ? Morbid [place] 12/28/2019  ? Murmur 08/03/2012  ? Nasal congestion 04/24/2021  ? Need for prophylactic vaccination and inoculation against influenza 09/24/2020  ? Other chest pain 04/24/2021  ? Other specified hypothyroidism 03/27/2020  ? Palpitations 08/03/2012  ? PONV (postoperative nausea and vomiting)   ? with thyroid surgery  ? Preop cardiovascular exam 04/28/2016  ? Primary hypertension 06/19/2021  ? Primary osteoarthritis of knee 04/28/2016  ? [place]monary hypertension, unspecified ([facility]   ? Retrocalcaneal bone spur 06/01/2017  ? Sleep apnea, obstructive   ? possible  ? 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 Tendonitis, Achilles, left 06/01/2017  ? [place] 01/11/2018  ? Added automatically from request for surgery [id number] ? Visit for screening mammogram 04/15/2015  ? Vitamin D insufficiency 03/27/2020  ? ? ?Past Surgical History:  ?Procedure Laterality Date  ? ABDOMINAL HYSTERECTOMY  1999  ? CARDIAC CATHETERIZATION  Feb 2012  ? LAD: 60% mid (FFR  ratio 0.83), LCX: 30-40%, RCA 20%.   ? KNEE ARTHROPLASTY Right 05/01/2016  ? Procedure:  TOTAL KNEE ARTHROPLASTY;  Surgeon: Marybelle Killings, MD;  Location: [facility];  Service: Orthopedics;  Laterality: Right;  ? RIGHT/LEFT HEART CATH AND CORONARY ANGIOGRAPHY Bilateral 01/17/2018  ? Procedure: RIGHT/LEFT HEART CATH AND CORONARY ANGIOGRAPHY;  Surgeon: Wellington Hampshire, MD;  Location: [facility] [facility];  Service: Cardiovascular;  Laterality: Bilateral;  ? THYROIDECTOMY  2000  ? ? ?Family History  ?Problem Relation Age of Onset  ? Diabetes Mother   ? ? ?Social History  ? ?Socioeconomic History  ? Marital status: Divorced  ?  Spouse  name: Not on file  ? Number of children: Not on file  ? Years of education: Not on file  ? Highest education level: Not on file  ?Occupational History  ? Not on file  ?Tobacco Use  ? Smoking status: Former  ?  Packs/day: 0.25  ?  Years: 20.00  ?  Pack years: 5.00  ?  Types: Cigarettes  ? Smokeless tobacco: Never  ? Tobacco comments:  ?  quit 2012  ?Vaping Use  ? Vaping Use: Never used  ?Substance and Sexual Activity  ? Alcohol use: No  ?  Alcohol/week: 3.0 standard drinks  ?  Types: 3 Glasses of wine per week  ? Drug use: No  ? Sexual activity: Not on file  ?Other Topics Concern  ? Not on file  ?Social History Narrative  ? Not on file  ? ?Social Determinants of Health  ? ?Financial Resource Strain: Not on file  ?Food Insecurity: Not on file  ?Transportation Needs: Not on file  ?Physical Activity: Not on file  ?Stress: Not on file  ?Social Connections: Not on file  ?Intimate Partner Violence: Not on file  ? ? ? ?Current Outpatient Medications:  ?  aspirin 81 MG EC tablet, Take 81 mg by mouth daily., Disp: , Rfl: 0 ?  busPIRone (BUSPAR) 15 MG tablet, Take 1 tablet (15 mg total) by mouth 2 (two) times daily., Disp: 60 tablet, Rfl: 2 ?  DULoxetine (CYMBALTA) 60 MG capsule, TAKE 1 CAPSULE BY MOUTH ONCE DAILY., Disp: 30 capsule, Rfl: 2 ?  furosemide (LASIX) 40 MG tablet, Take 1 tablet (40 mg total) by mouth daily., Disp: 30 tablet, Rfl: 2 ?  levothyroxine (SYNTHROID) 150 MCG tablet, Take 1 tablet (150 mcg total) by mouth daily before breakfast., Disp: 30 tablet, Rfl: 2 ?  lisinopril (ZESTRIL) 40 MG tablet, Take 1 tablet (40 mg total) by mouth daily., Disp: 30 tablet, Rfl: 2 ?  nitroGLYCERIN (NITROSTAT) 0.4 MG SL tablet, Place 1 tablet (0.4 mg total) under the tongue every 5 (five) minutes as needed., Disp: 25 tablet, Rfl: 6 ?  omeprazole (PRILOSEC) 40 MG capsule, Take 1 capsule (40 mg total) by mouth 2 (two) times daily., Disp: 60 capsule, Rfl: 2 ?  oxybutynin (DITROPAN-XL) 10 MG 24 hr tablet, Take 1 tablet (10 mg total)  by mouth at bedtime., Disp: 30 tablet, Rfl: 5 ?  rosuvastatin (CRESTOR) 20 MG tablet, Take 1 tablet (20 mg total) by mouth daily., Disp: 90 tablet, Rfl: 1 ?  traMADol (ULTRAM) 50 MG tablet, TAKE 1 TABLET BY MOUTH ONCE DAILY AS NEEDED, Disp: 30 tablet, Rfl: 1 ?  Vitamin D, Ergocalciferol, (DRISDOL) 1.25 MG (50000 UNIT) CAPS capsule, 1 po twice weekly, Disp: 8 capsule, Rfl: 5 ? ? ?Allergies  ?Allergen Reactions  ? Hydrocodone-Acetaminophen Other (See Comments)  ?  Headache, allergy to hydrocodone ?  Headache, allergy to hydrocodone ?  ? Latex Hives  ? Loracarbef Other (See Comments)  ? Penicillins Other (See Comments)  ?  Syncope ?Has patient had a PCN reaction causing immediate rash, facial/tongue/throat swelling, SOB or lightheadedness with hypotension: Unknown ?Has patient had a PCN reaction causing severe rash involving mucus membranes or skin necrosis: No ?Has patient had a PCN reaction that required hospitalization: No ?Has patient had a PCN reaction occurring within the last 10 years: Unknown ?If all of the above answers are "NO", then may proceed with Cephalosporin use. ? ?Syncope ?Has patient had a PCN reaction causing immediate rash, facial/tongue/throat swelling, SOB or lightheadedness with hypotension: Unknown ?Has patient had a PCN reaction causing severe rash involving mucus membranes or skin necrosis: No ?Has patient had a PCN reaction that required hospitalization: No ?Has patient had a PCN reaction occurring within the last 10 years: Unknown ?If all of the above answers are "NO", then may proceed with Cephalosporin use.  ? Hydrocodone-Acetaminophen Other (See Comments)  ?  Headache, allergy to hydrocodone  ? ?CONSTITUTIONAL: Negative for chills, fatigue, fever, unintentional weight gain and unintentional weight loss.  ?E/N/T: Negative for ear pain, nasal congestion and sore throat.  ?CARDIOVASCULAR: Negative for chest pain, dizziness, palpitations and pedal edema.  ?RESPIRATORY: Negative for recent  cough and dyspnea.  ?GASTROINTESTINAL: Negative for abdominal pain, acid reflux symptoms, constipation, diarrhea, nausea and vomiting.  ?MSK: Negative for arthralgias and myalgias.  ?INTEGUMENTARY: Negati

## 2022-03-10 NOTE — Assessment & Plan Note (Signed)
Continue meds ?TSH pending ?

## 2022-03-10 NOTE — Assessment & Plan Note (Signed)
Continue current meds and follow up with cardiology as directed ?

## 2022-03-11 LAB — CBC WITH DIFFERENTIAL/PLATELET
Basophils Absolute: 0.1 10*3/uL (ref 0.0–0.2)
Basos: 1 %
EOS (ABSOLUTE): 0.2 10*3/uL (ref 0.0–0.4)
Eos: 2 %
Hematocrit: 44.7 % (ref 34.0–46.6)
Hemoglobin: 15.2 g/dL (ref 11.1–15.9)
Immature Grans (Abs): 0 10*3/uL (ref 0.0–0.1)
Immature Granulocytes: 0 %
Lymphocytes Absolute: 2.2 10*3/uL (ref 0.7–3.1)
Lymphs: 22 %
MCH: 29.5 pg (ref 26.6–33.0)
MCHC: 34 g/dL (ref 31.5–35.7)
MCV: 87 fL (ref 79–97)
Monocytes Absolute: 0.5 10*3/uL (ref 0.1–0.9)
Monocytes: 5 %
Neutrophils Absolute: 7.3 10*3/uL — ABNORMAL HIGH (ref 1.4–7.0)
Neutrophils: 70 %
Platelets: 339 10*3/uL (ref 150–450)
RBC: 5.15 x10E6/uL (ref 3.77–5.28)
RDW: 13.1 % (ref 11.7–15.4)
WBC: 10.4 10*3/uL (ref 3.4–10.8)

## 2022-03-11 LAB — LIPID PANEL
Chol/HDL Ratio: 5.5 ratio — ABNORMAL HIGH (ref 0.0–4.4)
Cholesterol, Total: 233 mg/dL — ABNORMAL HIGH (ref 100–199)
HDL: 42 mg/dL (ref >39)
LDL Chol Calc (NIH): 167 mg/dL — ABNORMAL HIGH (ref 0–99)
Triglycerides: 134 mg/dL (ref 0–149)
VLDL Cholesterol Cal: 24 mg/dL (ref 5–40)

## 2022-03-11 LAB — COMPREHENSIVE METABOLIC PANEL
ALT: 16 IU/L (ref 0–32)
AST: 20 IU/L (ref 0–40)
Albumin/Globulin Ratio: 1.5 (ref 1.2–2.2)
Albumin: 4.1 g/dL (ref 3.8–4.8)
Alkaline Phosphatase: 108 IU/L (ref 44–121)
BUN/Creatinine Ratio: 13 (ref 12–28)
BUN: 14 mg/dL (ref 8–27)
Bilirubin Total: 0.7 mg/dL (ref 0.0–1.2)
CO2: 20 mmol/L (ref 20–29)
Calcium: 10.1 mg/dL (ref 8.7–10.3)
Chloride: 102 mmol/L (ref 96–106)
Creatinine, Ser: 1.04 mg/dL — ABNORMAL HIGH (ref 0.57–1.00)
Globulin, Total: 2.7 g/dL (ref 1.5–4.5)
Glucose: 106 mg/dL — ABNORMAL HIGH (ref 70–99)
Potassium: 4.1 mmol/L (ref 3.5–5.2)
Sodium: 139 mmol/L (ref 134–144)
Total Protein: 6.8 g/dL (ref 6.0–8.5)
eGFR: 60 mL/min/{1.73_m2} (ref >59)

## 2022-03-11 LAB — CARDIOVASCULAR RISK ASSESSMENT

## 2022-03-11 LAB — TSH: TSH: 0.588 u[IU]/mL (ref 0.450–4.500)

## 2022-03-11 LAB — VITAMIN D 25 HYDROXY (VIT D DEFICIENCY, FRACTURES): Vit D, 25-Hydroxy: 38.5 ng/mL (ref 30.0–100.0)

## 2022-03-11 LAB — HEMOGLOBIN A1C
Est. average glucose Bld gHb Est-mCnc: 114 mg/dL
Hgb A1c MFr Bld: 5.6 % (ref 4.8–5.6)

## 2022-04-06 ENCOUNTER — Other Ambulatory Visit: Payer: Self-pay | Admitting: Physician Assistant

## 2022-04-11 ENCOUNTER — Other Ambulatory Visit: Payer: Self-pay | Admitting: Physician Assistant

## 2022-05-01 ENCOUNTER — Other Ambulatory Visit: Payer: Self-pay | Admitting: Physician Assistant

## 2022-05-01 DIAGNOSIS — E039 Hypothyroidism, unspecified: Secondary | ICD-10-CM

## 2022-05-12 ENCOUNTER — Other Ambulatory Visit: Payer: Self-pay | Admitting: Physician Assistant

## 2022-05-12 DIAGNOSIS — E559 Vitamin D deficiency, unspecified: Secondary | ICD-10-CM

## 2022-06-01 ENCOUNTER — Other Ambulatory Visit: Payer: Self-pay | Admitting: Physician Assistant

## 2022-06-01 ENCOUNTER — Other Ambulatory Visit: Payer: Self-pay | Admitting: Family Medicine

## 2022-06-19 ENCOUNTER — Other Ambulatory Visit: Payer: Self-pay | Admitting: Physician Assistant

## 2022-06-19 DIAGNOSIS — E559 Vitamin D deficiency, unspecified: Secondary | ICD-10-CM

## 2022-07-01 ENCOUNTER — Other Ambulatory Visit: Payer: Self-pay | Admitting: Physician Assistant

## 2022-07-13 ENCOUNTER — Ambulatory Visit: Payer: Medicare Other | Admitting: Physician Assistant

## 2022-07-30 ENCOUNTER — Ambulatory Visit: Payer: Self-pay | Admitting: Physician Assistant

## 2022-07-30 ENCOUNTER — Encounter: Payer: Self-pay | Admitting: Physician Assistant

## 2022-07-30 VITALS — BP 132/88 | HR 80 | Temp 96.9°F | Ht 68.0 in | Wt 274.0 lb

## 2022-07-30 DIAGNOSIS — E039 Hypothyroidism, unspecified: Secondary | ICD-10-CM

## 2022-07-30 DIAGNOSIS — E782 Mixed hyperlipidemia: Secondary | ICD-10-CM

## 2022-07-30 DIAGNOSIS — E559 Vitamin D deficiency, unspecified: Secondary | ICD-10-CM

## 2022-07-30 DIAGNOSIS — F418 Other specified anxiety disorders: Secondary | ICD-10-CM

## 2022-07-30 DIAGNOSIS — I10 Essential (primary) hypertension: Secondary | ICD-10-CM

## 2022-07-30 MED ORDER — BUDESONIDE-FORMOTEROL FUMARATE 160-4.5 MCG/ACT IN AERO: 2.0000 | INHALATION_SPRAY | Freq: Two times a day (BID) | RESPIRATORY_TRACT | 3 refills | Status: DC

## 2022-07-30 NOTE — Progress Notes (Signed)
Established Patient Office Visit  Subjective:  Patient ID: ILLONA [name], female    DOB: 04/23/1956  Age: 66 y.o. MRN: [id number]  CC:  Chief Complaint  Patient presents with   Hypertension    HPI [name] presents for follow up of chronic medical problems -  Pt with history of hypertension and CHF - pt was on norvasc and carvedilol and has stopped both of these several months ago on her own - is only now taking lisinopril and lasix -  Pt denies chest pain or dyspnea today Has not followed up with cardiology  Pt with history of hypothyroidism - patient is currently taking synthroid 120mg qd - due for labwork but no insurance today - is supposed to get straightened out and will return  Pt has history of chronic pain in her right knee after having knee replacement surgery years ago - states she uses tramadol prn for pain   Pt with history of GERD - is taking prilosec 40 qd which controls her symptoms - denies any GERD symptoms  Pt has history of anxiety with mild depression - she states she is doing well on her current meds of buspar and cymbalta -   Pt with history of hyperlipidemia - pt states she is taking crestor 229mqd - will return for labwork  Pt with history of Vit D deficiency -  - is taking weekly supplement  Pt with smoking history - quit 8-9 years ago - has had some mild wheezing Past Medical History:  Diagnosis Date   Anxiety 12/28/2019   Arthritis    CAD in native artery 04/28/2016   [place]   Chronic back pain    Chronic pain of right knee 2/0/63/0160 Complication of anesthesia    Woke up during  Hysterectomy   Coronary artery disease 11/02/2010   Mild to Moderate   Dyslipidemia 04/28/2016   Encounter for follow-up surveillance of thyroid cancer 06/21/2018   Essential hypertension 04/28/2016   Family history of adverse reaction to anesthesia    GERD (gastroesophageal reflux disease)    omeprazole prn   GERD without esophagitis 12/28/2019    Headache with remote history of traumatic head injury 12/28/2019   History of blood transfusion    Hx of thyroid cancer    Hyperglycemia 06/19/2021   Hyperlipidemia    Hypertension    Hypothyroidism    Mixed hyperlipidemia    Morbid [place] ([facility]/25[place]   Murmur 08/03/2012   Nasal congestion 04/24/2021   Need for prophylactic vaccination and inoculation against influenza 09/24/2020   Other chest pain 04/24/2021   Other specified hypothyroidism 03/27/2020   Palpitations 08/03/2012   PONV (postoperative nausea and vomiting)    with thyroid surgery   Preop cardiovascular exam 04/28/2016   Primary hypertension 06/19/2021   Primary osteoarthritis of knee 04/28/2016   Pulmonary hypertension, unspecified [place]   Retrocalcaneal bone spur 06/01/2017   Sleep apnea, obstructive    possible   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    KNEE ARTHROPLASTY Right 05/01/2016   Procedure:  TOTAL KNEE ARTHROPLASTY;  Surgeon: Marybelle Killings, MD;  Location: [facility];  Service: Orthopedics;  Laterality: Right;   RIGHT/LEFT HEART CATH AND CORONARY ANGIOGRAPHY Bilateral 01/17/2018   Procedure: RIGHT/LEFT HEART CATH AND CORONARY ANGIOGRAPHY;  Surgeon: Wellington Hampshire, MD;  Location: [facility];  Service: Cardiovascular;  Laterality: Bilateral;   THYROIDECTOMY  2000    Family History  Problem Relation Age of Onset   Diabetes Mother     Social History   Socioeconomic History   Marital status: Divorced    Spouse name: Not on file   Number of children: Not on file   Years of education: Not on file   Highest  education level: Not on file  Occupational History   Not on file  Tobacco Use   Smoking status: Former    Packs/day: 0.25    Years: 20.00    Total pack years: 5.00    Types: Cigarettes   Smokeless tobacco: Never   Tobacco comments:    quit 2012  Vaping Use   Vaping Use: Never used  Substance and Sexual Activity   Alcohol use: No    Alcohol/week: 3.0 standard drinks of alcohol    Types: 3 Glasses of wine per week   Drug use: No   Sexual activity: Not on file  Other Topics Concern   Not on file  Social History Narrative   Not on file   Social Determinants of Health   Financial Resource Strain: Not on file  Food Insecurity: Not on file  Transportation Needs: Not on file  Physical Activity: Not on file  Stress: Not on file  Social Connections: Not on file  Intimate Partner Violence: Not on file     Current Outpatient Medications:    aspirin 81 MG EC tablet, Take 81 mg by mouth daily., Disp: , Rfl: 0   budesonide-formoterol (SYMBICORT) 160-4.5 MCG/ACT inhaler, Inhale 2 puffs into the lungs 2 (two) times daily., Disp: 1 each, Rfl: 3   busPIRone (BUSPAR) 15 MG tablet, TAKE 1 TABLET BY MOUTH TWICE DAILY., Disp: 180 tablet, Rfl: 1   DULoxetine (CYMBALTA) 60 MG capsule, TAKE 1 CAPSULE BY MOUTH ONCE DAILY., Disp: 30 capsule, Rfl: 2   furosemide (LASIX) 40 MG tablet, TAKE 1 TABLET BY MOUTH ONCE DAILY, Disp: 30 tablet, Rfl: 0   lisinopril (ZESTRIL) 40 MG tablet, TAKE 1 TABLET BY MOUTH ONCE DAILY, Disp: 30 tablet, Rfl: 0   omeprazole (PRILOSEC) 40 MG capsule, TAKE 1 CAPSULE BY MOUTH TWICE DAILY., Disp: 60 capsule, Rfl: 0   oxybutynin (DITROPAN-XL) 10 MG 24 hr tablet, Take 1 tablet (10 mg total) by mouth at bedtime., Disp: 30 tablet, Rfl: 5   rosuvastatin (CRESTOR) 20 MG tablet, Take 1 tablet (20 mg total) by mouth daily., Disp: 90 tablet, Rfl: 1   SYNTHROID 150 MCG tablet, TAKE 1 TABLET BY MOUTH ONCE DAILY BEFORE BREAKFAST, Disp: 30 tablet, Rfl: 2   traMADol (ULTRAM) 50 MG tablet,  TAKE 1 TABLET BY MOUTH ONCE DAILY AS NEEDED, Disp: 30 tablet, Rfl: 0   Vitamin D, Ergocalciferol, (DRISDOL) 1.25 MG (50000 UNIT) CAPS capsule, TAKE 1 CAPSULE BY MOUTH EVERY 7 DAYS., Disp: 5 capsule, Rfl: 0   nitroGLYCERIN (NITROSTAT) 0.4 MG SL tablet, Place 1 tablet (0.4 mg total) under the tongue every 5 (five) minutes as needed., Disp: 25 tablet, Rfl: 6   Allergies  Allergen Reactions   Hydrocodone-Acetaminophen Other (See Comments)    Headache, allergy to hydrocodone Headache, allergy to hydrocodone  Latex Hives   Loracarbef Other (See Comments)   Penicillins Other (See Comments)    Syncope Has patient had a PCN reaction causing immediate rash, facial/tongue/throat swelling, SOB or lightheadedness with hypotension: Unknown Has patient had a PCN reaction causing severe rash involving mucus membranes or skin necrosis: No Has patient had a PCN reaction that required hospitalization: No Has patient had a PCN reaction occurring within the last 10 years: Unknown If all of the above answers are "NO", then may proceed with Cephalosporin use.  Syncope Has patient had a PCN reaction causing immediate rash, facial/tongue/throat swelling, SOB or lightheadedness with hypotension: Unknown Has patient had a PCN reaction causing severe rash involving mucus membranes or skin necrosis: No Has patient had a PCN reaction that required hospitalization: No Has patient had a PCN reaction occurring within the last 10 years: Unknown If all of the above answers are "NO", then may proceed with Cephalosporin use.   Hydrocodone-Acetaminophen Other (See Comments)    Headache, allergy to hydrocodone   CONSTITUTIONAL: Negative for chills, fatigue, fever, unintentional weight gain and unintentional weight loss.  E/N/T: Negative for ear pain, nasal congestion and sore throat.  CARDIOVASCULAR: Negative for chest pain, dizziness, palpitations and pedal edema.  RESPIRATORY: see HPI  GASTROINTESTINAL: Negative for  abdominal pain, acid reflux symptoms, constipation, diarrhea, nausea and vomiting.  MSK: see HPI INTEGUMENTARY: Negative for rash.  PSYCHIATRIC: Negative for sleep disturbance and to question depression screen.  Negative for depression, negative for anhedonia.       Objective:  PHYSICAL EXAM:   VS: BP 132/88 (BP Location: Left Arm, Patient Position: Sitting, Cuff Size: Large)   Pulse 80   Temp (!) 96.9 F (36.1 C) (Temporal)   Ht _0  (1.727 m)   Wt 274 lb (124.3 kg)   SpO2 98%   BMI 41.66 kg/m   GEN: Well nourished, well developed, in no acute distress   Cardiac: RRR; no murmurs, rubs, or gallops,no edema - Respiratory:  normal respiratory rate and pattern with no distress - normal breath sounds with no rales, rhonchi, wheezes or rubs  MS: no deformity or atrophy  Skin: warm and dry, no rash   Psych: euthymic mood, appropriate affect and demeanor    Health Maintenance Due  Topic Date Due   Diabetic kidney evaluation - Urine ACR  Never done   Zoster Vaccines- Shingrix (1 of 2) Never done   COVID-19 Vaccine (4 - Pfizer risk series) 11/05/2021   INFLUENZA VACCINE  06/02/2022     There are no preventive care reminders to display for this patient.  Lab Results  Component Value Date   TSH 0.588 03/10/2022   Lab Results  Component Value Date   WBC 10.4 03/10/2022   HGB 15.2 03/10/2022   HCT 44.7 03/10/2022   MCV 87 03/10/2022   PLT 339 03/10/2022   Lab Results  Component Value Date   NA 139 03/10/2022   K 4.1 03/10/2022   CO2 20 03/10/2022   GLUCOSE 106 (H) 03/10/2022   BUN 14 03/10/2022   CREATININE 1.04 (H) 03/10/2022   BILITOT 0.7 03/10/2022   ALKPHOS 108 03/10/2022   AST 20 03/10/2022   ALT 16 03/10/2022   PROT 6.8 03/10/2022   ALBUMIN 4.1 03/10/2022   CALCIUM 10.1 03/10/2022   ANIONGAP 9 01/10/2018   EGFR 60 03/10/2022   Lab Results  Component Value Date   CHOL 233 (H) 03/10/2022   Lab Results  Component Value Date   HDL 42 03/10/2022  Lab Results  Component Value Date   LDLCALC 167 (H) 03/10/2022   Lab Results  Component Value Date   TRIG 134 03/10/2022   Lab Results  Component Value Date   CHOLHDL 5.5 (H) 03/10/2022   Lab Results  Component Value Date   HGBA1C 5.6 03/10/2022      Assessment & Plan:   Problem List Items Addressed This Visit       Cardiovascular and Mediastinum   Primary hypertension - Primary   Relevant Orders   Follow up with cardiology as directed Continue meds Return for labs     Digestive   GERD (gastroesophageal reflux disease) Continue meds     Endocrine   Other specified hypothyroidism   Relevant Medications   Continue current meds Return for labs      Musculoskeletal and Integument   Primary osteoarthritis of knee   Relevant Medications   Continue meds     Other   Mixed hyperlipidemia   Relevant Orders   Continue meds  Return for labs      Vitamin D insufficiency   Relevant Medications   Vitamin D, Ergocalciferol, (DRISDOL) 1.25 MG (50000 UNIT) CAPS capsule      Depression with anxiety Continue current medications      Bronchospasm Rx for symbicort                       Meds ordered this encounter  Medications   budesonide-formoterol (SYMBICORT) 160-4.5 MCG/ACT inhaler    Sig: Inhale 2 puffs into the lungs 2 (two) times daily.    Dispense:  1 each    Refill:  3    Order Specific Question:   Supervising Provider    AnswerShelton Silvas     Follow-up: Return in about 4 months (around 11/29/2022) for chronic fasting --- in 2 weeks nurse visit labs chronic.    SARA R Hurley Blevins, PA-Ch

## 2022-08-13 ENCOUNTER — Other Ambulatory Visit: Payer: Self-pay

## 2022-08-19 ENCOUNTER — Other Ambulatory Visit: Payer: Self-pay

## 2022-08-28 ENCOUNTER — Other Ambulatory Visit: Payer: Self-pay

## 2022-08-29 ENCOUNTER — Other Ambulatory Visit: Payer: Self-pay | Admitting: Physician Assistant

## 2022-09-11 ENCOUNTER — Other Ambulatory Visit: Payer: Self-pay | Admitting: Physician Assistant

## 2022-09-11 DIAGNOSIS — I10 Essential (primary) hypertension: Secondary | ICD-10-CM

## 2022-09-11 DIAGNOSIS — E782 Mixed hyperlipidemia: Secondary | ICD-10-CM

## 2022-09-11 DIAGNOSIS — E559 Vitamin D deficiency, unspecified: Secondary | ICD-10-CM

## 2022-09-11 DIAGNOSIS — R739 Hyperglycemia, unspecified: Secondary | ICD-10-CM

## 2022-09-11 DIAGNOSIS — E039 Hypothyroidism, unspecified: Secondary | ICD-10-CM

## 2022-09-14 ENCOUNTER — Ambulatory Visit: Payer: Self-pay

## 2022-09-14 DIAGNOSIS — R739 Hyperglycemia, unspecified: Secondary | ICD-10-CM

## 2022-09-14 DIAGNOSIS — E559 Vitamin D deficiency, unspecified: Secondary | ICD-10-CM

## 2022-09-14 DIAGNOSIS — I10 Essential (primary) hypertension: Secondary | ICD-10-CM

## 2022-09-14 DIAGNOSIS — E039 Hypothyroidism, unspecified: Secondary | ICD-10-CM

## 2022-09-14 DIAGNOSIS — E782 Mixed hyperlipidemia: Secondary | ICD-10-CM

## 2022-09-15 ENCOUNTER — Other Ambulatory Visit: Payer: Self-pay | Admitting: Physician Assistant

## 2022-09-15 ENCOUNTER — Other Ambulatory Visit: Payer: Self-pay | Admitting: Cardiology

## 2022-09-15 DIAGNOSIS — R899 Unspecified abnormal finding in specimens from other organs, systems and tissues: Secondary | ICD-10-CM

## 2022-09-15 DIAGNOSIS — I2510 Atherosclerotic heart disease of native coronary artery without angina pectoris: Secondary | ICD-10-CM

## 2022-09-15 DIAGNOSIS — E559 Vitamin D deficiency, unspecified: Secondary | ICD-10-CM

## 2022-09-15 LAB — CBC WITH DIFFERENTIAL/PLATELET
Basophils Absolute: 0.1 10*3/uL (ref 0.0–0.2)
Basos: 1 %
EOS (ABSOLUTE): 0.3 10*3/uL (ref 0.0–0.4)
Eos: 3 %
Hematocrit: 49.5 % — ABNORMAL HIGH (ref 34.0–46.6)
Hemoglobin: 16.1 g/dL — ABNORMAL HIGH (ref 11.1–15.9)
Immature Grans (Abs): 0 10*3/uL (ref 0.0–0.1)
Immature Granulocytes: 0 %
Lymphocytes Absolute: 2.8 10*3/uL (ref 0.7–3.1)
Lymphs: 28 %
MCH: 28.6 pg (ref 26.6–33.0)
MCHC: 32.5 g/dL (ref 31.5–35.7)
MCV: 88 fL (ref 79–97)
Monocytes Absolute: 0.5 10*3/uL (ref 0.1–0.9)
Monocytes: 5 %
Neutrophils Absolute: 6.5 10*3/uL (ref 1.4–7.0)
Neutrophils: 63 %
Platelets: 393 10*3/uL (ref 150–450)
RBC: 5.63 x10E6/uL — ABNORMAL HIGH (ref 3.77–5.28)
RDW: 13.4 % (ref 11.7–15.4)
WBC: 10.1 10*3/uL (ref 3.4–10.8)

## 2022-09-15 LAB — HEMOGLOBIN A1C
Est. average glucose Bld gHb Est-mCnc: 123 mg/dL
Hgb A1c MFr Bld: 5.9 % — ABNORMAL HIGH (ref 4.8–5.6)

## 2022-09-15 LAB — COMPREHENSIVE METABOLIC PANEL
ALT: 13 IU/L (ref 0–32)
AST: 16 IU/L (ref 0–40)
Albumin/Globulin Ratio: 1.8 (ref 1.2–2.2)
Albumin: 4.3 g/dL (ref 3.9–4.9)
Alkaline Phosphatase: 126 IU/L — ABNORMAL HIGH (ref 44–121)
BUN/Creatinine Ratio: 17 (ref 12–28)
BUN: 18 mg/dL (ref 8–27)
Bilirubin Total: 0.4 mg/dL (ref 0.0–1.2)
CO2: 21 mmol/L (ref 20–29)
Calcium: 10.7 mg/dL — ABNORMAL HIGH (ref 8.7–10.3)
Chloride: 102 mmol/L (ref 96–106)
Creatinine, Ser: 1.09 mg/dL — ABNORMAL HIGH (ref 0.57–1.00)
Globulin, Total: 2.4 g/dL (ref 1.5–4.5)
Glucose: 122 mg/dL — ABNORMAL HIGH (ref 70–99)
Potassium: 4.4 mmol/L (ref 3.5–5.2)
Sodium: 139 mmol/L (ref 134–144)
Total Protein: 6.7 g/dL (ref 6.0–8.5)
eGFR: 56 mL/min/{1.73_m2} — ABNORMAL LOW (ref >59)

## 2022-09-15 LAB — LIPID PANEL
Chol/HDL Ratio: 6.1 ratio — ABNORMAL HIGH (ref 0.0–4.4)
Cholesterol, Total: 275 mg/dL — ABNORMAL HIGH (ref 100–199)
HDL: 45 mg/dL (ref >39)
LDL Chol Calc (NIH): 200 mg/dL — ABNORMAL HIGH (ref 0–99)
Triglycerides: 160 mg/dL — ABNORMAL HIGH (ref 0–149)
VLDL Cholesterol Cal: 30 mg/dL (ref 5–40)

## 2022-09-15 LAB — VITAMIN D 25 HYDROXY (VIT D DEFICIENCY, FRACTURES): Vit D, 25-Hydroxy: 24.6 ng/mL — ABNORMAL LOW (ref 30.0–100.0)

## 2022-09-15 LAB — TSH: TSH: 5.43 u[IU]/mL — ABNORMAL HIGH (ref 0.450–4.500)

## 2022-09-15 LAB — CARDIOVASCULAR RISK ASSESSMENT

## 2022-09-15 MED ORDER — VITAMIN D (ERGOCALCIFEROL) 1.25 MG (50000 UNIT) PO CAPS: 50000.0000 [IU] | ORAL_CAPSULE | ORAL | 0 refills | Status: DC

## 2022-09-15 MED ORDER — OMEPRAZOLE 40 MG PO CPDR: 40.0000 mg | DELAYED_RELEASE_CAPSULE | Freq: Two times a day (BID) | ORAL | 0 refills | Status: DC

## 2022-09-15 MED ORDER — TRAMADOL HCL 50 MG PO TABS: 50.0000 mg | ORAL_TABLET | Freq: Every day | ORAL | 0 refills | Status: DC | PRN

## 2022-09-15 MED ORDER — NITROGLYCERIN 0.4 MG SL SUBL: 0.4000 mg | SUBLINGUAL_TABLET | SUBLINGUAL | 3 refills | Status: DC | PRN

## 2022-09-15 MED ORDER — LISINOPRIL 40 MG PO TABS: 40.0000 mg | ORAL_TABLET | Freq: Every day | ORAL | 0 refills | Status: DC

## 2022-09-16 ENCOUNTER — Other Ambulatory Visit: Payer: Self-pay | Admitting: Physician Assistant

## 2022-09-16 MED ORDER — LEVOTHYROXINE SODIUM 175 MCG PO TABS: 175.0000 ug | ORAL_TABLET | Freq: Every day | ORAL | 0 refills | Status: DC

## 2022-09-18 ENCOUNTER — Other Ambulatory Visit: Payer: Self-pay | Admitting: Physician Assistant

## 2022-09-18 MED ORDER — LEVOTHYROXINE SODIUM 175 MCG PO TABS: 175.0000 ug | ORAL_TABLET | Freq: Every day | ORAL | 0 refills | Status: DC

## 2022-09-21 ENCOUNTER — Other Ambulatory Visit: Payer: Self-pay

## 2022-09-21 DIAGNOSIS — R899 Unspecified abnormal finding in specimens from other organs, systems and tissues: Secondary | ICD-10-CM

## 2022-09-22 LAB — COMPREHENSIVE METABOLIC PANEL
ALT: 14 IU/L (ref 0–32)
AST: 13 IU/L (ref 0–40)
Albumin/Globulin Ratio: 1.8 (ref 1.2–2.2)
Albumin: 4 g/dL (ref 3.9–4.9)
Alkaline Phosphatase: 123 IU/L — ABNORMAL HIGH (ref 44–121)
BUN/Creatinine Ratio: 19 (ref 12–28)
BUN: 16 mg/dL (ref 8–27)
Bilirubin Total: 0.2 mg/dL (ref 0.0–1.2)
CO2: 20 mmol/L (ref 20–29)
Calcium: 9.9 mg/dL (ref 8.7–10.3)
Chloride: 102 mmol/L (ref 96–106)
Creatinine, Ser: 0.86 mg/dL (ref 0.57–1.00)
Globulin, Total: 2.2 g/dL (ref 1.5–4.5)
Glucose: 153 mg/dL — ABNORMAL HIGH (ref 70–99)
Potassium: 4.4 mmol/L (ref 3.5–5.2)
Sodium: 140 mmol/L (ref 134–144)
Total Protein: 6.2 g/dL (ref 6.0–8.5)
eGFR: 74 mL/min/{1.73_m2} (ref >59)

## 2022-10-17 ENCOUNTER — Other Ambulatory Visit: Payer: Self-pay | Admitting: Physician Assistant

## 2022-10-17 DIAGNOSIS — E559 Vitamin D deficiency, unspecified: Secondary | ICD-10-CM

## 2022-10-19 MED ORDER — LISINOPRIL 40 MG PO TABS
40.0000 mg | ORAL_TABLET | Freq: Every day | ORAL | 0 refills | Status: DC
  Filled 2022-10-19 – 2022-12-14 (×2): qty 30, 30d supply, fill #0

## 2022-10-19 MED ORDER — TRAMADOL HCL 50 MG PO TABS
50.0000 mg | ORAL_TABLET | Freq: Every day | ORAL | 0 refills | Status: DC | PRN
  Filled 2022-10-19 – 2022-12-14 (×2): qty 30, 30d supply, fill #0

## 2022-10-19 MED ORDER — OMEPRAZOLE 40 MG PO CPDR
40.0000 mg | DELAYED_RELEASE_CAPSULE | Freq: Two times a day (BID) | ORAL | 0 refills | Status: DC
  Filled 2022-10-19 – 2022-12-14 (×2): qty 60, 30d supply, fill #0

## 2022-10-19 MED ORDER — VITAMIN D (ERGOCALCIFEROL) 1.25 MG (50000 UNIT) PO CAPS
50000.0000 [IU] | ORAL_CAPSULE | ORAL | 0 refills | Status: DC
  Filled 2022-10-19: qty 5, 35d supply, fill #0

## 2022-10-20 ENCOUNTER — Other Ambulatory Visit (HOSPITAL_COMMUNITY): Payer: Self-pay

## 2022-10-20 ENCOUNTER — Other Ambulatory Visit: Payer: Self-pay

## 2022-10-28 ENCOUNTER — Other Ambulatory Visit: Payer: Self-pay

## 2022-10-28 ENCOUNTER — Other Ambulatory Visit: Payer: Self-pay | Admitting: Physician Assistant

## 2022-12-10 ENCOUNTER — Encounter: Payer: Self-pay | Admitting: Physician Assistant

## 2022-12-10 ENCOUNTER — Ambulatory Visit (INDEPENDENT_AMBULATORY_CARE_PROVIDER_SITE_OTHER): Payer: Medicare Other | Admitting: Physician Assistant

## 2022-12-10 VITALS — BP 140/100 | HR 92 | Temp 97.4°F | Ht 68.0 in | Wt 267.0 lb

## 2022-12-10 DIAGNOSIS — E039 Hypothyroidism, unspecified: Secondary | ICD-10-CM | POA: Diagnosis not present

## 2022-12-10 DIAGNOSIS — E559 Vitamin D deficiency, unspecified: Secondary | ICD-10-CM | POA: Diagnosis not present

## 2022-12-10 DIAGNOSIS — I10 Essential (primary) hypertension: Secondary | ICD-10-CM | POA: Diagnosis not present

## 2022-12-10 DIAGNOSIS — F418 Other specified anxiety disorders: Secondary | ICD-10-CM

## 2022-12-10 DIAGNOSIS — E782 Mixed hyperlipidemia: Secondary | ICD-10-CM

## 2022-12-10 MED ORDER — VITAMIN D (ERGOCALCIFEROL) 1.25 MG (50000 UNIT) PO CAPS: 50000.0000 [IU] | ORAL_CAPSULE | ORAL | 3 refills | Status: DC

## 2022-12-10 MED ORDER — AMLODIPINE BESYLATE 5 MG PO TABS: 5.0000 mg | ORAL_TABLET | Freq: Every day | ORAL | 3 refills | Status: DC

## 2022-12-10 MED ORDER — OXYBUTYNIN CHLORIDE ER 10 MG PO TB24: 10.0000 mg | ORAL_TABLET | Freq: Every day | ORAL | 3 refills | Status: DC

## 2022-12-10 NOTE — Progress Notes (Signed)
Established Patient Office Visit  Subjective:  Patient ID: Kathleen [name], female    DOB: [birth year]  Age: 67 y.o. MRN: [id number]  CC:  Chief Complaint  Patient presents with   Hypertension    HPI Kathleen [name] presents for follow up of chronic medical problems -  Pt with history of hypertension and CHF - pt was on norvasc and carvedilol and has stopped both of these several months ago on her own - is only now taking lisinopril and lasix -  Pt denies chest pain or dyspnea today Has not followed up with cardiology - Bp today elevated initially at 148/82 and recheck 140/100 - we will restart norvasc '5mg'$   Pt with history of hypothyroidism - patient is currently taking synthroid 175 mcg qd - due for labwork   Pt has history of chronic pain in her right knee after having knee replacement surgery years ago - states she uses tramadol prn for pain   Pt with history of GERD - is taking prilosec 40 qd which controls her symptoms - denies any GERD symptoms  Pt has history of anxiety with mild depression - she states she is doing well on her current meds of buspar and cymbalta -   Pt with history of hyperlipidemia - pt states she is taking crestor '20mg'$  qd - will return for labwork  Pt with history of Vit D deficiency - states she has ran out of medication and not been taking regularly - requests refill of medication  Pt with history of hyperlipidemia - last labwork showed significant elevations and she had not been taking crestor at that time but states she did restart since last visit Past Medical History:  Diagnosis Date   Anxiety 12/28/2019   Arthritis    CAD in native artery 04/28/2016   Cancer ([place])    Chronic back pain    Chronic pain of right knee 7/56/4332   Complication of anesthesia    Woke up during  Hysterectomy   Coronary artery disease 11/02/2010   Mild to Moderate   Dyslipidemia 04/28/2016   Encounter for follow-up surveillance of thyroid cancer 06/21/2018    Essential hypertension 04/28/2016   Family history of adverse reaction to anesthesia    GERD (gastroesophageal reflux disease)    omeprazole prn   GERD without esophagitis 12/28/2019   Headache with remote history of traumatic head injury 12/28/2019   History of blood transfusion    Hx of thyroid cancer    Hyperglycemia 06/19/2021   Hyperlipidemia    Hypertension    Hypothyroidism    Mixed hyperlipidemia    Morbid obesity ([place]) 12/28/2019   Murmur 08/03/2012   Nasal congestion 04/24/2021   Need for prophylactic vaccination and inoculation against influenza 09/24/2020   Other chest pain 04/24/2021   Other specified hypothyroidism 03/27/2020   Palpitations 08/03/2012   PONV (postoperative nausea and vomiting)    with thyroid surgery   Preop cardiovascular exam 04/28/2016   Primary hypertension 06/19/2021   Primary osteoarthritis of knee 04/28/2016   Pulmonary hypertension, unspecified ([place])    Retrocalcaneal bone spur 06/01/2017   Sleep apnea, obstructive    possible   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    KNEE ARTHROPLASTY Right 05/01/2016   Procedure:  TOTAL KNEE ARTHROPLASTY;  Surgeon: Marybelle Killings, MD;  Location: [facility];  Service: Orthopedics;  Laterality: Right;   RIGHT/LEFT HEART CATH AND CORONARY ANGIOGRAPHY Bilateral 01/17/2018   Procedure: RIGHT/LEFT HEART CATH AND CORONARY ANGIOGRAPHY;  Surgeon: Wellington Hampshire, MD;  Location: [facility];  Service: Cardiovascular;  Laterality: Bilateral;   THYROIDECTOMY  2000    Family History  Problem Relation Age of Onset   Diabetes Mother      Social History   Socioeconomic History   Marital status: Divorced    Spouse name: Not on file   Number of children: Not on file   Years of education: Not on file   Highest education level: Not on file  Occupational History   Not on file  Tobacco Use   Smoking status: Former    Packs/day: 0.25    Years: 20.00    Total pack years: 5.00    Types: Cigarettes   Smokeless tobacco: Never   Tobacco comments:    quit 2012  Vaping Use   Vaping Use: Never used  Substance and Sexual Activity   Alcohol use: No    Alcohol/week: 3.0 standard drinks of alcohol    Types: 3 Glasses of wine per week   Drug use: No   Sexual activity: Not on file  Other Topics Concern   Not on file  Social History Narrative   Not on file   Social Determinants of Health   Financial Resource Strain: Not on file  Food Insecurity: Not on file  Transportation Needs: Not on file  Physical Activity: Not on file  Stress: Not on file  Social Connections: Not on file  Intimate Partner Violence: Not on file     Current Outpatient Medications:    amLODipine (NORVASC) 5 MG tablet, Take 1 tablet (5 mg total) by mouth daily., Disp: 30 tablet, Rfl: 3   aspirin EC 81 MG tablet, Take 81 mg by mouth daily. Swallow whole., Disp: , Rfl:    budesonide-formoterol (SYMBICORT) 160-4.5 MCG/ACT inhaler, Inhale 2 puffs into the lungs 2 (two) times daily., Disp: 1 each, Rfl: 3   busPIRone (BUSPAR) 15 MG tablet, TAKE ONE (1) TABLET BY MOUTH TWO (2) TIMES DAILY, Disp: 60 tablet, Rfl: 2   DULoxetine (CYMBALTA) 60 MG capsule, TAKE 1 CAPSULE BY MOUTH ONCE DAILY., Disp: 30 capsule, Rfl: 2   furosemide (LASIX) 40 MG tablet, TAKE 1 TABLET BY MOUTH ONCE DAILY, Disp: 30 tablet, Rfl: 0   levothyroxine (SYNTHROID) 175 MCG tablet, Take 1 tablet (175 mcg total) by mouth daily., Disp: 90 tablet, Rfl: 0   lisinopril (ZESTRIL) 40 MG tablet, Take 1 tablet (40 mg total) by mouth daily., Disp: 30 tablet, Rfl: 0   omeprazole (PRILOSEC) 40 MG  capsule, Take 1 capsule (40 mg total) by mouth 2 (two) times daily., Disp: 60 capsule, Rfl: 0   rosuvastatin (CRESTOR) 20 MG tablet, Take 1 tablet (20 mg total) by mouth daily., Disp: 90 tablet, Rfl: 1   traMADol (ULTRAM) 50 MG tablet, Take 1 tablet (50 mg total) by mouth daily as needed., Disp: 30 tablet, Rfl: 0   oxybutynin (DITROPAN-XL) 10 MG 24 hr tablet, Take 1 tablet (10 mg total) by mouth at bedtime., Disp: 30 tablet, Rfl: 3   Vitamin D, Ergocalciferol, (DRISDOL) 1.25 MG (50000 UNIT) CAPS capsule, Take  1 capsule (50,000 Units total) by mouth every 7 (seven) days., Disp: 5 capsule, Rfl: 3   Allergies  Allergen Reactions   Hydrocodone-Acetaminophen Other (See Comments)    Headache, allergy to hydrocodone Headache, allergy to hydrocodone    Latex Hives   Loracarbef Other (See Comments)   Penicillins Other (See Comments)    Syncope Has patient had a PCN reaction causing immediate rash, facial/tongue/throat swelling, SOB or lightheadedness with hypotension: Unknown Has patient had a PCN reaction causing severe rash involving mucus membranes or skin necrosis: No Has patient had a PCN reaction that required hospitalization: No Has patient had a PCN reaction occurring within the last 10 years: Unknown If all of the above answers are "NO", then may proceed with Cephalosporin use.  Syncope Has patient had a PCN reaction causing immediate rash, facial/tongue/throat swelling, SOB or lightheadedness with hypotension: Unknown Has patient had a PCN reaction causing severe rash involving mucus membranes or skin necrosis: No Has patient had a PCN reaction that required hospitalization: No Has patient had a PCN reaction occurring within the last 10 years: Unknown If all of the above answers are "NO", then may proceed with Cephalosporin use.   Hydrocodone-Acetaminophen Other (See Comments)    Headache, allergy to hydrocodone   Prednisone Other (See Comments)    Crazy   CONSTITUTIONAL: Negative  for chills, fatigue, fever, unintentional weight gain and unintentional weight loss.  E/N/T: Negative for ear pain, nasal congestion and sore throat.  CARDIOVASCULAR: Negative for chest pain, dizziness, palpitations and pedal edema.  RESPIRATORY: Negative for recent cough and dyspnea.  GASTROINTESTINAL: Negative for abdominal pain, acid reflux symptoms, constipation, diarrhea, nausea and vomiting.  MSK: Negative for arthralgias and myalgias.  PSYCHIATRIC: Negative for sleep disturbance and to question depression screen.  Negative for depression, negative for anhedonia.       Objective:  PHYSICAL EXAM:   VS: BP (!) 140/100   Pulse 92   Temp (!) 97.4 F (36.3 C) (Temporal)   Ht '5\' 8"'$  (1.727 m)   Wt 267 lb (121.1 kg)   SpO2 98%   BMI 40.60 kg/m   GEN: Well nourished, well developed, in no acute distress  Cardiac: RRR; no murmurs, rubs, or gallops,no edema - Respiratory:  normal respiratory rate and pattern with no distress - normal breath sounds with no rales, rhonchi, wheezes or rubs Skin: warm and dry, no rash  Psych: euthymic mood, appropriate affect and demeanor   Health Maintenance Due  Topic Date Due   Pneumonia Vaccine 74+ Years old (1 of 2 - PCV) Never done   Diabetic kidney evaluation - Urine ACR  Never done   DTaP/Tdap/Td (1 - Tdap) Never done   Zoster Vaccines- Shingrix (1 of 2) Never done   INFLUENZA VACCINE  06/02/2022   COVID-19 Vaccine (4 - 2023-24 season) 07/03/2022     There are no preventive care reminders to display for this patient.  Lab Results  Component Value Date   TSH 5.430 (H) 09/14/2022   Lab Results  Component Value Date   WBC 10.1 09/14/2022   HGB 16.1 (H) 09/14/2022   HCT 49.5 (H) 09/14/2022   MCV 88 09/14/2022   PLT 393 09/14/2022   Lab Results  Component Value Date   NA 140 09/21/2022   K 4.4 09/21/2022   CO2 20 09/21/2022   GLUCOSE 153 (H) 09/21/2022   BUN 16 09/21/2022   CREATININE 0.86 09/21/2022   BILITOT 0.2 09/21/2022    ALKPHOS 123 (H) 09/21/2022  AST 13 09/21/2022   ALT 14 09/21/2022   PROT 6.2 09/21/2022   ALBUMIN 4.0 09/21/2022   CALCIUM 9.9 09/21/2022   ANIONGAP 9 01/10/2018   EGFR 74 09/21/2022   Lab Results  Component Value Date   CHOL 275 (H) 09/14/2022   Lab Results  Component Value Date   HDL 45 09/14/2022   Lab Results  Component Value Date   LDLCALC 200 (H) 09/14/2022   Lab Results  Component Value Date   TRIG 160 (H) 09/14/2022   Lab Results  Component Value Date   CHOLHDL 6.1 (H) 09/14/2022   Lab Results  Component Value Date   HGBA1C 5.9 (H) 09/14/2022      Assessment & Plan:   Problem List Items Addressed This Visit       Cardiovascular and Mediastinum   Primary hypertension - Primary   Relevant Orders   Follow up with cardiology as directed Continue meds and will restart norvasc '5mg'$  qd Labwork pending     Digestive   GERD (gastroesophageal reflux disease) Continue meds     Endocrine   Other specified hypothyroidism   Relevant Medications   Continue current meds TSH pending      Musculoskeletal and Integument   Primary osteoarthritis of knee   Relevant Medications   Continue meds     Other   Mixed hyperlipidemia   Relevant Orders   Continue meds  Cmp/lipid panel pending      Vitamin D insufficiency   Relevant Medications   Vitamin D, Ergocalciferol, (DRISDOL) 1.25 MG (50000 UNIT) CAPS capsule      Depression with anxiety Continue current medications                             Meds ordered this encounter  Medications   Vitamin D, Ergocalciferol, (DRISDOL) 1.25 MG (50000 UNIT) CAPS capsule    Sig: Take 1 capsule (50,000 Units total) by mouth every 7 (seven) days.    Dispense:  5 capsule    Refill:  3    Order Specific Question:   Supervising Provider    Answer:   [name] Silvas   oxybutynin (DITROPAN-XL) 10 MG 24 hr tablet    Sig: Take 1 tablet (10 mg total) by mouth at bedtime.    Dispense:  30 tablet     Refill:  3    Order Specific Question:   Supervising Provider    Answer:   [name] Silvas   amLODipine (NORVASC) 5 MG tablet    Sig: Take 1 tablet (5 mg total) by mouth daily.    Dispense:  30 tablet    Refill:  3    Order Specific Question:   Supervising Provider    AnswerShelton Silvas     Follow-up: Return in about 4 months (around 04/10/2023) for chronic fasting follow up.    SARA R Edwinna Rochette, PA-Ch

## 2022-12-11 LAB — LIPID PANEL
Chol/HDL Ratio: 5.9 ratio — ABNORMAL HIGH (ref 0.0–4.4)
Cholesterol, Total: 202 mg/dL — ABNORMAL HIGH (ref 100–199)
HDL: 34 mg/dL — ABNORMAL LOW (ref >39)
LDL Chol Calc (NIH): 144 mg/dL — ABNORMAL HIGH (ref 0–99)
Triglycerides: 134 mg/dL (ref 0–149)
VLDL Cholesterol Cal: 24 mg/dL (ref 5–40)

## 2022-12-11 LAB — COMPREHENSIVE METABOLIC PANEL
ALT: 14 IU/L (ref 0–32)
AST: 16 IU/L (ref 0–40)
Albumin/Globulin Ratio: 1.6 (ref 1.2–2.2)
Albumin: 4 g/dL (ref 3.9–4.9)
Alkaline Phosphatase: 102 IU/L (ref 44–121)
BUN/Creatinine Ratio: 18 (ref 12–28)
BUN: 15 mg/dL (ref 8–27)
Bilirubin Total: 0.4 mg/dL (ref 0.0–1.2)
CO2: 21 mmol/L (ref 20–29)
Calcium: 9.9 mg/dL (ref 8.7–10.3)
Chloride: 106 mmol/L (ref 96–106)
Creatinine, Ser: 0.82 mg/dL (ref 0.57–1.00)
Globulin, Total: 2.5 g/dL (ref 1.5–4.5)
Glucose: 106 mg/dL — ABNORMAL HIGH (ref 70–99)
Potassium: 4.1 mmol/L (ref 3.5–5.2)
Sodium: 141 mmol/L (ref 134–144)
Total Protein: 6.5 g/dL (ref 6.0–8.5)
eGFR: 79 mL/min/{1.73_m2} (ref >59)

## 2022-12-11 LAB — VITAMIN D 25 HYDROXY (VIT D DEFICIENCY, FRACTURES): Vit D, 25-Hydroxy: 19 ng/mL — ABNORMAL LOW (ref 30.0–100.0)

## 2022-12-11 LAB — CARDIOVASCULAR RISK ASSESSMENT

## 2022-12-11 LAB — TSH: TSH: 0.521 u[IU]/mL (ref 0.450–4.500)

## 2022-12-14 ENCOUNTER — Other Ambulatory Visit: Payer: Self-pay | Admitting: Physician Assistant

## 2022-12-15 ENCOUNTER — Other Ambulatory Visit: Payer: Self-pay | Admitting: Physician Assistant

## 2022-12-15 ENCOUNTER — Other Ambulatory Visit: Payer: Self-pay

## 2022-12-15 ENCOUNTER — Ambulatory Visit: Payer: Medicare Other | Admitting: Physician Assistant

## 2022-12-15 MED ORDER — LEVOTHYROXINE SODIUM 175 MCG PO TABS: 175.0000 ug | ORAL_TABLET | Freq: Every day | ORAL | 0 refills | Status: DC

## 2022-12-15 MED ORDER — FUROSEMIDE 40 MG PO TABS: 40.0000 mg | ORAL_TABLET | Freq: Every day | ORAL | 0 refills | Status: DC

## 2022-12-16 ENCOUNTER — Other Ambulatory Visit (HOSPITAL_COMMUNITY): Payer: Self-pay

## 2022-12-16 ENCOUNTER — Other Ambulatory Visit: Payer: Self-pay | Admitting: Physician Assistant

## 2022-12-16 MED ORDER — ROSUVASTATIN CALCIUM 40 MG PO TABS: 40.0000 mg | ORAL_TABLET | Freq: Every day | ORAL | 0 refills | Status: DC

## 2022-12-16 MED ORDER — LEVOTHYROXINE SODIUM 175 MCG PO TABS
175.0000 ug | ORAL_TABLET | Freq: Every day | ORAL | 0 refills | Status: DC
  Filled 2022-12-16 – 2022-12-29 (×3): qty 90, 90d supply, fill #0

## 2022-12-17 ENCOUNTER — Other Ambulatory Visit (HOSPITAL_COMMUNITY): Payer: Self-pay

## 2022-12-29 ENCOUNTER — Other Ambulatory Visit (HOSPITAL_COMMUNITY): Payer: Self-pay

## 2022-12-29 ENCOUNTER — Other Ambulatory Visit: Payer: Self-pay

## 2022-12-30 ENCOUNTER — Telehealth: Payer: Self-pay

## 2022-12-30 NOTE — Telephone Encounter (Signed)
This patient sent a MyChart message requesting an appointment for severe HA. Patient stated that she has spoke with Gay Filler about this before. She is just wanting a referral. The patient is wanting to know if she can be referral to a neurologist or does she need to be seen first by our office?

## 2023-01-04 NOTE — Telephone Encounter (Signed)
Patient has an appointment for 01/07/2023.

## 2023-01-06 ENCOUNTER — Ambulatory Visit: Payer: Medicare Other | Admitting: Physician Assistant

## 2023-01-07 ENCOUNTER — Encounter: Payer: Self-pay | Admitting: Physician Assistant

## 2023-01-07 ENCOUNTER — Ambulatory Visit (INDEPENDENT_AMBULATORY_CARE_PROVIDER_SITE_OTHER): Payer: Medicare Other | Admitting: Physician Assistant

## 2023-01-07 VITALS — BP 162/100 | HR 75 | Temp 98.8°F | Resp 14 | Ht 68.0 in | Wt 268.0 lb

## 2023-01-07 DIAGNOSIS — I10 Essential (primary) hypertension: Secondary | ICD-10-CM | POA: Diagnosis not present

## 2023-01-07 DIAGNOSIS — M542 Cervicalgia: Secondary | ICD-10-CM | POA: Diagnosis not present

## 2023-01-07 MED ORDER — CYCLOBENZAPRINE HCL 5 MG PO TABS: 5.0000 mg | ORAL_TABLET | Freq: Every day | ORAL | 1 refills | Status: DC

## 2023-01-07 MED ORDER — MELOXICAM 15 MG PO TABS: 15.0000 mg | ORAL_TABLET | Freq: Every day | ORAL | 2 refills | Status: DC

## 2023-01-07 MED ORDER — AMLODIPINE BESYLATE 10 MG PO TABS: 10.0000 mg | ORAL_TABLET | Freq: Every day | ORAL | 2 refills | Status: DC

## 2023-01-07 NOTE — Progress Notes (Signed)
Acute Office Visit  Subjective:    Patient ID: BANI UM, female    DOB: May 05, 1956, 67 y.o.   MRN: [id number]  Chief Complaint  Patient presents with   Shoulder Pain    Right    Headache    HPI Patient is in today for complaints of chronic right shoulder and neck pain.  States she has had issues for over a year and may even date back to 2020 when she had a fall.  She has been helping lift her father a lot who has been having health issues and that possibly may have aggravated symptoms.  States she has tried aleve and ibuprofen with some relief.  Describes pain at top of right shoulder that refers into her neck and base of scalp with tenderness and pain.  Does aggravate worse with bending neck forward  Past Medical History:  Diagnosis Date   Anxiety 12/28/2019   Arthritis    CAD in native artery 04/28/2016   Cancer (HCC)    Chronic back pain    Chronic pain of right knee [id number]   Complication of anesthesia    Woke up during  Hysterectomy   Coronary artery disease 11/02/2010   Mild to Moderate   Dyslipidemia 04/28/2016   Encounter for follow-up surveillance of thyroid cancer 06/21/2018   Essential hypertension 04/28/2016   Family history of adverse reaction to anesthesia    GERD (gastroesophageal reflux disease)    omeprazole prn   GERD without esophagitis 12/28/2019   Headache with remote history of traumatic head injury 12/28/2019   History of blood transfusion    Hx of thyroid cancer    Hyperglycemia 06/19/2021   Hyperlipidemia    Hypertension    Hypothyroidism    Mixed hyperlipidemia    Morbid obesity ([place]) 12/28/2019   Murmur 08/03/2012   Nasal congestion 04/24/2021   Need for prophylactic vaccination and inoculation against influenza 09/24/2020   Other chest pain 04/24/2021   Other specified hypothyroidism 03/27/2020   Palpitations 08/03/2012   PONV (postoperative nausea and vomiting)    with thyroid surgery   Preop cardiovascular exam 04/28/2016   Primary  hypertension 06/19/2021   Primary osteoarthritis of knee 04/28/2016   Pulmonary hypertension, unspecified ([place])    Retrocalcaneal bone spur 06/01/2017   Sleep apnea, obstructive    possible   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/01/2016   Tendonitis, Achilles, left 06/01/2017   Unstable angina ([place]) 01/11/2018   Added automatically from request for surgery [id number]   Visit for screening mammogram 04/15/2015   Vitamin D insufficiency 03/27/2020    Past Surgical History:  Procedure Laterality Date   ABDOMINAL HYSTERECTOMY  1999   CARDIAC CATHETERIZATION  Feb 2012   LAD: 60% mid (FFR ratio 0.83), LCX: 30-40%, RCA 20%.    KNEE ARTHROPLASTY Right 05/01/2016   Procedure:  TOTAL KNEE ARTHROPLASTY;  Surgeon: Marybelle Killings, MD;  Location: [facility];  Service: Orthopedics;  Laterality: Right;   RIGHT/LEFT HEART CATH AND CORONARY ANGIOGRAPHY Bilateral 01/17/2018   Procedure: RIGHT/LEFT HEART CATH AND CORONARY ANGIOGRAPHY;  Surgeon: Wellington Hampshire, MD;  Location: [facility];  Service: Cardiovascular;  Laterality: Bilateral;   THYROIDECTOMY  2000    Family History  Problem Relation Age of Onset   Diabetes Mother     Social History   Socioeconomic History   Marital status: Divorced    Spouse name: Not on file   Number of children: Not on file   Years of  education: Not on file   Highest education level: Not on file  Occupational History   Not on file  Tobacco Use   Smoking status: Former    Packs/day: 0.50    Years: 20.00    Total pack years: 10.00    Types: Cigarettes    Quit date: 2014    Years since quitting: 10.1   Smokeless tobacco: Never   Tobacco comments:    quit 2012  Vaping Use   Vaping Use: Never used  Substance and Sexual Activity   Alcohol use: Not Currently    Alcohol/week: 1.0 standard drink of alcohol    Types: 1 Glasses of wine per week   Drug use: No   Sexual activity: Not Currently  Other Topics Concern   Not on file  Social History Narrative    Not on file   Social Determinants of Health   Financial Resource Strain: Not on file  Food Insecurity: Not on file  Transportation Needs: Not on file  Physical Activity: Not on file  Stress: Not on file  Social Connections: Not on file  Intimate Partner Violence: Not on file     Current Outpatient Medications:    amLODipine (NORVASC) 10 MG tablet, Take 1 tablet (10 mg total) by mouth daily., Disp: 30 tablet, Rfl: 2   aspirin EC 81 MG tablet, Take 81 mg by mouth daily. Swallow whole., Disp: , Rfl:    budesonide-formoterol (SYMBICORT) 160-4.5 MCG/ACT inhaler, Inhale 2 puffs into the lungs 2 (two) times daily., Disp: 1 each, Rfl: 3   busPIRone (BUSPAR) 15 MG tablet, TAKE ONE (1) TABLET BY MOUTH TWO (2) TIMES DAILY, Disp: 60 tablet, Rfl: 2   cyclobenzaprine (FLEXERIL) 5 MG tablet, Take 1 tablet (5 mg total) by mouth at bedtime., Disp: 30 tablet, Rfl: 1   DULoxetine (CYMBALTA) 60 MG capsule, TAKE 1 CAPSULE BY MOUTH ONCE DAILY., Disp: 30 capsule, Rfl: 2   furosemide (LASIX) 40 MG tablet, Take 1 tablet (40 mg total) by mouth daily., Disp: 30 tablet, Rfl: 0   levothyroxine (SYNTHROID) 175 MCG tablet, Take 1 tablet (175 mcg total) by mouth daily., Disp: 90 tablet, Rfl: 0   lisinopril (ZESTRIL) 40 MG tablet, Take 1 tablet (40 mg total) by mouth daily., Disp: 30 tablet, Rfl: 0   meloxicam (MOBIC) 15 MG tablet, Take 1 tablet (15 mg total) by mouth daily., Disp: 30 tablet, Rfl: 2   omeprazole (PRILOSEC) 40 MG capsule, Take 1 capsule (40 mg total) by mouth 2 (two) times daily., Disp: 60 capsule, Rfl: 0   oxybutynin (DITROPAN-XL) 10 MG 24 hr tablet, Take 1 tablet (10 mg total) by mouth at bedtime., Disp: 30 tablet, Rfl: 3   rosuvastatin (CRESTOR) 40 MG tablet, Take 1 tablet (40 mg total) by mouth daily., Disp: 90 tablet, Rfl: 0   traMADol (ULTRAM) 50 MG tablet, Take 1 tablet (50 mg total) by mouth daily as needed., Disp: 30 tablet, Rfl: 0   Vitamin D, Ergocalciferol, (DRISDOL) 1.25 MG (50000 UNIT) CAPS  capsule, Take 1 capsule (50,000 Units total) by mouth every 7 (seven) days., Disp: 5 capsule, Rfl: 3   Allergies  Allergen Reactions   Hydrocodone-Acetaminophen Other (See Comments) and Nausea And Vomiting    Headache, allergy to hydrocodone   Latex Hives   Loracarbef Other (See Comments)   Penicillins Other (See Comments)    Syncope Has patient had a PCN reaction causing immediate rash, facial/tongue/throat swelling, SOB or lightheadedness with hypotension: Unknown Has patient had a PCN  reaction causing severe rash involving mucus membranes or skin necrosis: No Has patient had a PCN reaction that required hospitalization: No Has patient had a PCN reaction occurring within the last 10 years: Unknown If all of the above answers are "NO", then may proceed with Cephalosporin use.  Syncope Has patient had a PCN reaction causing immediate rash, facial/tongue/throat swelling, SOB or lightheadedness with hypotension: Unknown Has patient had a PCN reaction causing severe rash involving mucus membranes or skin necrosis: No Has patient had a PCN reaction that required hospitalization: No Has patient had a PCN reaction occurring within the last 10 years: Unknown If all of the above answers are "NO", then may proceed with Cephalosporin use.   Hydrocodone-Acetaminophen Other (See Comments)    Headache, allergy to hydrocodone   Prednisone Other (See Comments)    Crazy    CONSTITUTIONAL: Negative for chills, fatigue, fever,  CARDIOVASCULAR: Negative for chest pain, dizziness, palpitations  MSK: see HPI  NEUROLOGICAL: Negative for dizziness       Objective:    PHYSICAL EXAM:   VS: BP (!) 162/100   Pulse 75   Temp 98.8 F (37.1 C)   Resp 14   Ht '5\' 8"'$  (1.727 m)   Wt 268 lb (121.6 kg)   SpO2 95%   BMI 40.75 kg/m   GEN: Well nourished, well developed, in no acute distress  Cardiac: RRR; no murmurs, rubs, or gallops,no edema -  Respiratory:  normal respiratory rate and pattern with no  distress - normal breath sounds with no rales, rhonchi, wheezes or rubs Skin: warm and dry, no rash  Psych: euthymic mood, appropriate affect and demeanor   Wt Readings from Last 3 Encounters:  01/07/23 268 lb (121.6 kg)  12/10/22 267 lb (121.1 kg)  07/30/22 274 lb (124.3 kg)    Health Maintenance Due  Topic Date Due   Pneumonia Vaccine 8+ Years old (1 of 2 - PCV) Never done   Diabetic kidney evaluation - Urine ACR  Never done   DTaP/Tdap/Td (1 - Tdap) Never done   Zoster Vaccines- Shingrix (1 of 2) Never done   INFLUENZA VACCINE  06/02/2022   COVID-19 Vaccine (4 - 2023-24 season) 07/03/2022    There are no preventive care reminders to display for this patient.        Assessment & Plan:   Problem List Items Addressed This Visit       Cardiovascular and Mediastinum   Primary hypertension Increase norvasc to '10mg'$  qd Continue other meds as directed Recheck bp in 2 weeks     Other   Neck pain - Primary   Relevant Medications   meloxicam (MOBIC) 15 MG tablet   cyclobenzaprine (FLEXERIL) 5 MG tablet Rom exercises     Meds ordered this encounter  Medications   meloxicam (MOBIC) 15 MG tablet    Sig: Take 1 tablet (15 mg total) by mouth daily.    Dispense:  30 tablet    Refill:  2    Order Specific Question:   Supervising Provider    Answer:   [name] Silvas   cyclobenzaprine (FLEXERIL) 5 MG tablet    Sig: Take 1 tablet (5 mg total) by mouth at bedtime.    Dispense:  30 tablet    Refill:  1    Order Specific Question:   Supervising Provider    Answer:   COX, Lynder Parents   amLODipine (NORVASC) 10 MG tablet    Sig: Take 1 tablet (10 mg total)  by mouth daily.    Dispense:  30 tablet    Refill:  2     SARA R Zoiee Wimmer, PA-C

## 2023-01-28 ENCOUNTER — Ambulatory Visit: Payer: Medicare Other

## 2023-02-01 ENCOUNTER — Other Ambulatory Visit: Payer: Self-pay | Admitting: Physician Assistant

## 2023-02-11 ENCOUNTER — Other Ambulatory Visit: Payer: Self-pay | Admitting: Physician Assistant

## 2023-02-16 ENCOUNTER — Other Ambulatory Visit: Payer: Self-pay | Admitting: Physician Assistant

## 2023-03-01 ENCOUNTER — Telehealth: Payer: Self-pay

## 2023-03-01 DIAGNOSIS — N390 Urinary tract infection, site not specified: Secondary | ICD-10-CM | POA: Diagnosis not present

## 2023-03-01 NOTE — Telephone Encounter (Signed)
Patient came in today to get BP check, BP was 138/88. Provider made aware. Patient also

## 2023-03-05 NOTE — Telephone Encounter (Signed)
Patient called and wanted to make sure the provider got her result from her urine culture that she faxed over.   Made patient aware provider was out sick but we did get results, Patient stated she will have Dr. Erlene Senters to call her something in for her UTI since Marianne Sofia, [place] was out of office today.

## 2023-03-08 ENCOUNTER — Encounter: Payer: Self-pay | Admitting: Physician Assistant

## 2023-03-09 ENCOUNTER — Other Ambulatory Visit: Payer: Self-pay | Admitting: Physician Assistant

## 2023-03-09 DIAGNOSIS — M542 Cervicalgia: Secondary | ICD-10-CM

## 2023-03-18 ENCOUNTER — Other Ambulatory Visit: Payer: Self-pay | Admitting: Physician Assistant

## 2023-03-24 ENCOUNTER — Other Ambulatory Visit: Payer: Self-pay | Admitting: Physician Assistant

## 2023-03-30 ENCOUNTER — Telehealth: Payer: Self-pay

## 2023-03-30 NOTE — Telephone Encounter (Signed)
Patient informed and patient scheduled

## 2023-03-30 NOTE — Telephone Encounter (Signed)
Patient called and stated that she recently has noticed that she quotes that her true "redneck" seems to want to come out of her in certain situations, such as having a conflict with someone and she is quick to want to tell them off or do so without thought. She states that the cymbalta has been working great for her and she is asking if you can up the dose of the cymbalta or make recommendations for patient? Please advise.

## 2023-03-30 NOTE — Telephone Encounter (Signed)
Pt will need to come in for follow up and discuss med change because at last visit also bp significantly elevated - will need to evaluate that before adjusting medication

## 2023-04-08 ENCOUNTER — Ambulatory Visit (INDEPENDENT_AMBULATORY_CARE_PROVIDER_SITE_OTHER): Payer: Medicare Other | Admitting: Physician Assistant

## 2023-04-08 ENCOUNTER — Other Ambulatory Visit: Payer: Self-pay | Admitting: Physician Assistant

## 2023-04-08 ENCOUNTER — Encounter: Payer: Self-pay | Admitting: Physician Assistant

## 2023-04-08 VITALS — BP 128/68 | HR 69 | Temp 97.8°F | Ht 68.0 in | Wt 254.0 lb

## 2023-04-08 DIAGNOSIS — I10 Essential (primary) hypertension: Secondary | ICD-10-CM

## 2023-04-08 DIAGNOSIS — F418 Other specified anxiety disorders: Secondary | ICD-10-CM | POA: Diagnosis not present

## 2023-04-08 MED ORDER — DULOXETINE HCL 60 MG PO CPEP
60.0000 mg | ORAL_CAPSULE | Freq: Two times a day (BID) | ORAL | 3 refills | Status: DC
Start: 2023-04-08 — End: 2023-09-15

## 2023-04-08 MED ORDER — BUSPIRONE HCL 15 MG PO TABS
ORAL_TABLET | ORAL | 2 refills | Status: DC
Start: 2023-04-08 — End: 2024-01-05

## 2023-04-08 NOTE — Progress Notes (Signed)
Subjective:  Patient ID: Kathleen [name], female    DOB: May 12, 1956  Age: 67 y.o. MRN: [id number]  Chief Complaint  Patient presents with   Depression   Anxiety    HPI Pt in today stating she is having breakthrough symptoms of feeling depressed and overwhelmed - having some family issues - states she is taking cymbalta 60mg  qd and has done well on that in the past She is taking buspar 15mg  but only once daily - she felt like twice daily of 15mg  made her sleepy  Pt bp today under much better control - is taking meds as directed.  She has lost weight and states she is doing so by watching her diet better and decreasing carbs/sugars - states she is not using any type of weight loss medications     04/08/2023    9:37 AM 12/10/2022    8:17 AM 07/30/2022    8:33 AM 10/21/2021   11:06 AM 06/19/2021    2:02 PM  Depression screen PHQ 2/9  Decreased Interest 1 0 0 0 0  Down, Depressed, Hopeless 3 0 0 0 0  PHQ - 2 Score 4 0 0 0 0  Altered sleeping 2 0 0 0   Tired, decreased energy 3 2 0 0   Change in appetite 0 0 0 0   Feeling bad or failure about yourself  3 0 0 0   Trouble concentrating 1 2 0 0   Moving slowly or fidgety/restless 0 0 0 0   Suicidal thoughts 0 0 0 0   PHQ-9 Score 13 4 0 0   Difficult doing work/chores Not difficult at all Not difficult at all Not difficult at all Not difficult at all         06/19/2021    2:02 PM 07/30/2022    8:33 AM 12/10/2022    8:21 AM  Fall Risk  Falls in the past year? 1 0 0  Was there an injury with Fall? 0 0 0  Fall Risk Category Calculator 2 0 0  Fall Risk Category (Retired) Moderate Low   (RETIRED) Patient Fall Risk Level Moderate fall risk Low fall risk   Patient at Risk for Falls Due to  No Fall Risks No Fall Risks  Fall risk Follow up  Falls evaluation completed Falls evaluation completed     ROS CONSTITUTIONAL: Negative for chills, fatigue, fever, unintentional weight gain and unintentional weight loss.  CARDIOVASCULAR: Negative for  chest pain, dizziness, palpitations and pedal edema.  RESPIRATORY: Negative for recent cough and dyspnea.  GASTROINTESTINAL: Negative for abdominal pain, acid reflux symptoms, constipation, diarrhea, nausea and vomiting.  PSYCHIATRIC: see HPI  Current Outpatient Medications:    amLODipine (NORVASC) 10 MG tablet, Take 1 tablet (10 mg total) by mouth daily., Disp: 30 tablet, Rfl: 2   aspirin EC 81 MG tablet, Take 81 mg by mouth daily. Swallow whole., Disp: , Rfl:    budesonide-formoterol (SYMBICORT) 160-4.5 MCG/ACT inhaler, Inhale 2 puffs into the lungs 2 (two) times daily., Disp: 1 each, Rfl: 3   busPIRone (BUSPAR) 15 MG tablet, 1/2 po bid, Disp: 30 tablet, Rfl: 2   cyclobenzaprine (FLEXERIL) 5 MG tablet, Take 1 tablet (5 mg total) by mouth at bedtime., Disp: 30 tablet, Rfl: 0   DULoxetine (CYMBALTA) 60 MG capsule, Take 1 capsule (60 mg total) by mouth 2 (two) times daily., Disp: 60 capsule, Rfl: 3   furosemide (LASIX) 40 MG tablet, Take 1 tablet (40 mg total) by mouth daily.,  Disp: 30 tablet, Rfl: 0   lisinopril (ZESTRIL) 40 MG tablet, Take 1 tablet (40 mg total) by mouth daily., Disp: 30 tablet, Rfl: 0   meloxicam (MOBIC) 15 MG tablet, Take 1 tablet (15 mg total) by mouth daily., Disp: 30 tablet, Rfl: 2   omeprazole (PRILOSEC) 40 MG capsule, Take 1 capsule by mouth 2 times daily., Disp: 60 capsule, Rfl: 0   oxybutynin (DITROPAN-XL) 10 MG 24 hr tablet, Take 1 tablet (10 mg total) by mouth at bedtime., Disp: 30 tablet, Rfl: 3   rosuvastatin (CRESTOR) 40 MG tablet, Take 1 tablet (40 mg total) by mouth daily., Disp: 90 tablet, Rfl: 0   SYNTHROID 175 MCG tablet, Take 1 tablet by mouth once daily., Disp: 90 tablet, Rfl: 0   traMADol (ULTRAM) 50 MG tablet, Take 1 tablet (50 mg total) by mouth daily as needed., Disp: 30 tablet, Rfl: 0   Vitamin D, Ergocalciferol, (DRISDOL) 1.25 MG (50000 UNIT) CAPS capsule, Take 1 capsule (50,000 Units total) by mouth every 7 (seven) days., Disp: 5 capsule, Rfl:  3  Past Medical History:  Diagnosis Date   Anxiety 12/28/2019   Arthritis    CAD in native artery 04/28/2016   Cancer (HCC)    Chronic back pain    Chronic pain of right knee 12/28/2019   Complication of anesthesia    Woke up during  Hysterectomy   Coronary artery disease 11/02/2010   Mild to Moderate   Dyslipidemia 04/28/2016   Encounter for follow-up surveillance of thyroid cancer 06/21/2018   Essential hypertension 04/28/2016   Family history of adverse reaction to anesthesia    GERD (gastroesophageal reflux disease)    omeprazole prn   GERD without esophagitis 12/28/2019   Headache with remote history of traumatic head injury 12/28/2019   History of blood transfusion    Hx of thyroid cancer    Hyperglycemia 06/19/2021   Hyperlipidemia    Hypertension    Hypothyroidism    Mixed hyperlipidemia    Morbid obesity (HCC) 12/28/2019   Murmur 08/03/2012   Nasal congestion 04/24/2021   Need for prophylactic vaccination and inoculation against influenza 09/24/2020   Other chest pain 04/24/2021   Other specified hypothyroidism 03/27/2020   Palpitations 08/03/2012   PONV (postoperative nausea and vomiting)    with thyroid surgery   Preop cardiovascular exam 04/28/2016   Primary hypertension 06/19/2021   Primary osteoarthritis of knee 04/28/2016   Pulmonary hypertension, unspecified (HCC)    Retrocalcaneal bone spur 06/01/2017   Sleep apnea, obstructive    possible   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/01/2016   Tendonitis, Achilles, left 06/01/2017   Unstable angina (HCC) 01/11/2018   Added automatically from request for surgery [id number]   Visit for screening mammogram 04/15/2015   Vitamin D insufficiency 03/27/2020   Objective:  PHYSICAL EXAM:   BP 128/68 (BP Location: Left Arm, Patient Position: Sitting)   Pulse 69   Temp 97.8 F (36.6 C) (Temporal)   Ht 5\' 8"  (1.727 m)   Wt 254 lb (115.2 kg)   SpO2 97%   BMI 38.62 kg/m    GEN: Well nourished, well developed, in no acute  distress  Cardiac: RRR; no murmurs, rubs,  Respiratory:  normal respiratory rate and pattern with no distress - normal breath sounds with no rales, rhonchi, wheezes or rubs Psych: euthymic mood, appropriate affect and demeanor  Assessment & Plan:    Primary hypertension Continue current meds Depression with anxiety -     DULoxetine HCl;  Take 1 capsule (60 mg total) by mouth 2 (two) times daily.  Dispense: 60 capsule; Refill: 3 -     busPIRone HCl; 1/2 po bid  Dispense: 30 tablet; Refill: 2 - 15mg      Follow-up: Return in about 4 weeks (around 05/06/2023) for chronic fasting follow-up.  An After Visit Summary was printed and given to the patient.  Jettie Pagan [facility] [phone]

## 2023-04-26 ENCOUNTER — Other Ambulatory Visit: Payer: Self-pay | Admitting: Physician Assistant

## 2023-04-26 DIAGNOSIS — M542 Cervicalgia: Secondary | ICD-10-CM

## 2023-04-26 DIAGNOSIS — E559 Vitamin D deficiency, unspecified: Secondary | ICD-10-CM

## 2023-05-03 ENCOUNTER — Encounter: Payer: Self-pay | Admitting: Physician Assistant

## 2023-05-03 ENCOUNTER — Ambulatory Visit (INDEPENDENT_AMBULATORY_CARE_PROVIDER_SITE_OTHER): Payer: Medicare Other | Admitting: Physician Assistant

## 2023-05-03 VITALS — BP 122/82 | HR 75 | Temp 97.1°F | Ht 68.0 in | Wt 254.0 lb

## 2023-05-03 DIAGNOSIS — F418 Other specified anxiety disorders: Secondary | ICD-10-CM | POA: Diagnosis not present

## 2023-05-03 DIAGNOSIS — E782 Mixed hyperlipidemia: Secondary | ICD-10-CM | POA: Diagnosis not present

## 2023-05-03 DIAGNOSIS — E039 Hypothyroidism, unspecified: Secondary | ICD-10-CM

## 2023-05-03 DIAGNOSIS — I10 Essential (primary) hypertension: Secondary | ICD-10-CM

## 2023-05-03 DIAGNOSIS — R7303 Prediabetes: Secondary | ICD-10-CM | POA: Diagnosis not present

## 2023-05-03 DIAGNOSIS — I509 Heart failure, unspecified: Secondary | ICD-10-CM

## 2023-05-03 DIAGNOSIS — E559 Vitamin D deficiency, unspecified: Secondary | ICD-10-CM | POA: Diagnosis not present

## 2023-05-03 DIAGNOSIS — I110 Hypertensive heart disease with heart failure: Secondary | ICD-10-CM

## 2023-05-03 NOTE — Progress Notes (Signed)
Established Patient Office Visit  Subjective:  Patient ID: Kathleen [name], female    DOB: 1956/01/31  Age: 67 y.o. MRN: [id number]  CC:  Chief Complaint  Patient presents with   Medical Management of Chronic Issues    HPI Kathleen [name] presents for follow up of chronic medical problems -  Pt with hypertension and CHF - pt is currently taking norvasc 10mg , lasix 40mg , and zestril 40mg   Pt is overdue for follow up with cardiology Pt denies chest pain/dyspnea / edema  Pt with  hypothyroidism - patient is currently taking synthroid qd - due for labwork  Voices no problems  Pt has history of chronic pain in her right knee after having knee replacement surgery years ago - states she uses tramadol prn for pain   Pt with history of GERD - is taking prilosec 40 qd which controls her symptoms - denies any GERD symptoms  Pt has history of anxiety with mild depression - she states she is doing well on her current meds of buspar and cymbalta -   Pt with history of hyperlipidemia and coronary artery disease - she is currently taking crestor 40mg  every day - overdue for follow up with cardiology  Pt with history of Vit D deficiency -  - is taking weekly supplement  Past Medical History:  Diagnosis Date   Anxiety 12/28/2019   Arthritis    CAD in native artery 04/28/2016   Cancer (HCC)    Chronic back pain    Chronic pain of right knee 12/28/2019   Complication of anesthesia    Woke up during  Hysterectomy   Coronary artery disease 11/02/2010   Mild to Moderate   Dyslipidemia 04/28/2016   Encounter for follow-up surveillance of thyroid cancer 06/21/2018   Essential hypertension 04/28/2016   Family history of adverse reaction to anesthesia    GERD (gastroesophageal reflux disease)    omeprazole prn   GERD without esophagitis 12/28/2019   Headache with remote history of traumatic head injury 12/28/2019   History of blood transfusion    Hx of thyroid cancer    Hyperglycemia  06/19/2021   Hyperlipidemia    Hypertension    Hypothyroidism    Mixed hyperlipidemia    Morbid obesity (HCC) 12/28/2019   Murmur 08/03/2012   Nasal congestion 04/24/2021   Need for prophylactic vaccination and inoculation against influenza 09/24/2020   Other chest pain 04/24/2021   Other specified hypothyroidism 03/27/2020   Palpitations 08/03/2012   PONV (postoperative nausea and vomiting)    with thyroid surgery   Preop cardiovascular exam 04/28/2016   Primary hypertension 06/19/2021   Primary osteoarthritis of knee 04/28/2016   Pulmonary hypertension, unspecified (HCC)    Retrocalcaneal bone spur 06/01/2017   Sleep apnea, obstructive    possible   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/01/2016   Tendonitis, Achilles, left 06/01/2017   Unstable angina (HCC) 01/11/2018   Added automatically from request for surgery [id number]   Visit for screening mammogram 04/15/2015   Vitamin D insufficiency 03/27/2020    Past Surgical History:  Procedure Laterality Date   ABDOMINAL HYSTERECTOMY  1999   CARDIAC CATHETERIZATION  Feb 2012   LAD: 60% mid (FFR ratio 0.83), LCX: 30-40%, RCA 20%.    KNEE ARTHROPLASTY Right 05/01/2016   Procedure:  TOTAL KNEE ARTHROPLASTY;  Surgeon: Eldred Manges, MD;  Location: [facility] OR;  Service: Orthopedics;  Laterality: Right;   RIGHT/LEFT HEART CATH AND CORONARY ANGIOGRAPHY Bilateral 01/17/2018   Procedure:  RIGHT/LEFT HEART CATH AND CORONARY ANGIOGRAPHY;  Surgeon: Iran Ouch, MD;  Location: [facility] IN[facility];  Service: Cardiovascular;  Laterality: Bilateral;   THYROIDECTOMY  2000    Family History  Problem Relation Age of Onset   Diabetes Mother     Social History   Socioeconomic History   Marital status: Divorced    Spouse name: Not on file   Number of children: Not on file   Years of education: Not on file   Highest education level: Not on file  Occupational History   Not on file  Tobacco Use   Smoking status: Former    Packs/day: 0.50    Years:  20.00    Additional pack years: 0.00    Total pack years: 10.00    Types: Cigarettes    Quit date: 2014    Years since quitting: 10.5   Smokeless tobacco: Never   Tobacco comments:    quit 2012  Vaping Use   Vaping Use: Never used  Substance and Sexual Activity   Alcohol use: Not Currently    Alcohol/week: 1.0 standard drink of alcohol    Types: 1 Glasses of wine per week   Drug use: No   Sexual activity: Not Currently  Other Topics Concern   Not on file  Social History Narrative   Not on file   Social Determinants of Health   Financial Resource Strain: Low Risk  (05/03/2023)   Overall Financial Resource Strain (CARDIA)    Difficulty of Paying Living Expenses: Not hard at all  Food Insecurity: No Food Insecurity (05/03/2023)   Hunger Vital Sign    Worried About Running Out of Food in the Last Year: Never true    Ran Out of Food in the Last Year: Never true  Transportation Needs: No Transportation Needs (05/03/2023)   PRAPARE - Administrator, Civil Service (Medical): No    Lack of Transportation (Non-Medical): No  Physical Activity: Inactive (05/03/2023)   Exercise Vital Sign    Days of Exercise per Week: 0 days    Minutes of Exercise per Session: 0 min  Stress: No Stress Concern Present (05/03/2023)   [employer] of Occupational Health - Occupational Stress Questionnaire    Feeling of Stress : Only a little  Social Connections: Moderately Integrated (05/03/2023)   Social Connection and Isolation Panel [NHANES]    Frequency of Communication with Friends and Family: More than three times a week    Frequency of Social Gatherings with Friends and Family: More than three times a week    Attends Religious Services: More than 4 times per year    Active Member of [employer] or Organizations: No    Attends Banker Meetings: Never    Marital Status: Living with partner  Intimate Partner Violence: Not At Risk (05/03/2023)   Humiliation, Afraid, Rape, and Kick  questionnaire    Fear of Current or Ex-Partner: No    Emotionally Abused: No    Physically Abused: No    Sexually Abused: No     Current Outpatient Medications:    amLODipine (NORVASC) 10 MG tablet, Take 1 tablet (10 mg total) by mouth daily., Disp: 30 tablet, Rfl: 2   aspirin EC 81 MG tablet, Take 81 mg by mouth daily. Swallow whole., Disp: , Rfl:    budesonide-formoterol (SYMBICORT) 160-4.5 MCG/ACT inhaler, Inhale 2 puffs into the lungs 2 (two) times daily., Disp: 1 each, Rfl: 3   busPIRone (BUSPAR) 15 MG tablet, 1/2  po bid, Disp: 30 tablet, Rfl: 2   cyclobenzaprine (FLEXERIL) 5 MG tablet, Take 1 tablet (5 mg total) by mouth at bedtime., Disp: 30 tablet, Rfl: 0   DULoxetine (CYMBALTA) 60 MG capsule, Take 1 capsule (60 mg total) by mouth 2 (two) times daily., Disp: 60 capsule, Rfl: 3   furosemide (LASIX) 40 MG tablet, Take 1 tablet (40 mg total) by mouth daily., Disp: 30 tablet, Rfl: 0   lisinopril (ZESTRIL) 40 MG tablet, Take 1 tablet (40 mg total) by mouth daily., Disp: 30 tablet, Rfl: 0   meloxicam (MOBIC) 15 MG tablet, Take 1 tablet by mouth once daily., Disp: 30 tablet, Rfl: 0   omeprazole (PRILOSEC) 40 MG capsule, Take 1 capsule by mouth 2 times daily., Disp: 60 capsule, Rfl: 0   oxybutynin (DITROPAN-XL) 10 MG 24 hr tablet, Take 1 tablet by mouth once daily at bedtime., Disp: 30 tablet, Rfl: 0   rosuvastatin (CRESTOR) 40 MG tablet, Take 1 tablet (40 mg total) by mouth daily., Disp: 90 tablet, Rfl: 0   SYNTHROID 175 MCG tablet, Take 1 tablet by mouth once daily., Disp: 90 tablet, Rfl: 0   traMADol (ULTRAM) 50 MG tablet, Take 1 tablet (50 mg total) by mouth daily as needed., Disp: 30 tablet, Rfl: 0   Vitamin D, Ergocalciferol, 50000 units CAPS, Take 1 capsule by mouth once every 7 days as directed., Disp: 5 capsule, Rfl: 0   Allergies  Allergen Reactions   Hydrocodone-Acetaminophen Other (See Comments) and Nausea And Vomiting    Headache, allergy to hydrocodone   Latex Hives    Loracarbef Other (See Comments)   Penicillins Other (See Comments)    Syncope Has patient had a PCN reaction causing immediate rash, facial/tongue/throat swelling, SOB or lightheadedness with hypotension: Unknown Has patient had a PCN reaction causing severe rash involving mucus membranes or skin necrosis: No Has patient had a PCN reaction that required hospitalization: No Has patient had a PCN reaction occurring within the last 10 years: Unknown If all of the above answers are "NO", then may proceed with Cephalosporin use.  Syncope Has patient had a PCN reaction causing immediate rash, facial/tongue/throat swelling, SOB or lightheadedness with hypotension: Unknown Has patient had a PCN reaction causing severe rash involving mucus membranes or skin necrosis: No Has patient had a PCN reaction that required hospitalization: No Has patient had a PCN reaction occurring within the last 10 years: Unknown If all of the above answers are "NO", then may proceed with Cephalosporin use.   Hydrocodone-Acetaminophen Other (See Comments)    Headache, allergy to hydrocodone   Prednisone Other (See Comments)    Crazy   CONSTITUTIONAL: Negative for chills, fatigue, fever, unintentional weight gain and unintentional weight loss.  E/N/T: Negative for ear pain, nasal congestion and sore throat.  CARDIOVASCULAR: Negative for chest pain, dizziness, palpitations and pedal edema.  RESPIRATORY: Negative for recent cough and dyspnea.  GASTROINTESTINAL: Negative for abdominal pain, acid reflux symptoms, constipation, diarrhea, nausea and vomiting.  MSK: Negative for arthralgias and myalgias.  INTEGUMENTARY: Negative for rash.  NEUROLOGICAL: Negative for dizziness and headaches.  PSYCHIATRIC: Negative for sleep disturbance and to question depression screen.  Negative for depression, negative for anhedonia.       Objective:  PHYSICAL EXAM:   VS: BP 122/82 (BP Location: Left Arm, Patient Position: Sitting, Cuff  Size: Large)   Pulse 75   Temp (!) 97.1 F (36.2 C) (Temporal)   Ht 5\' 8"  (1.727 m)   Wt 254 lb (115.2  kg)   SpO2 95%   BMI 38.62 kg/m   GEN: Well nourished, well developed, in no acute distress  Cardiac: RRR; no murmurs, rubs, or gallops,no edema -  Respiratory:  normal respiratory rate and pattern with no distress - normal breath sounds with no rales, rhonchi, wheezes or rubs Skin: warm and dry, no rash  Psych: euthymic mood, appropriate affect and demeanor   Health Maintenance Due  Topic Date Due   Medicare Annual Wellness (AWV)  Never done   Pneumonia Vaccine 43+ Years old (1 of 2 - PCV) Never done   DTaP/Tdap/Td (1 - Tdap) Never done   Zoster Vaccines- Shingrix (1 of 2) Never done   DEXA SCAN  Never done   COVID-19 Vaccine (4 - 2023-24 season) 07/03/2022     There are no preventive care reminders to display for this patient.  Lab Results  Component Value Date   TSH 0.521 12/10/2022   Lab Results  Component Value Date   WBC 10.1 09/14/2022   HGB 16.1 (H) 09/14/2022   HCT 49.5 (H) 09/14/2022   MCV 88 09/14/2022   PLT 393 09/14/2022   Lab Results  Component Value Date   NA 141 12/10/2022   K 4.1 12/10/2022   CO2 21 12/10/2022   GLUCOSE 106 (H) 12/10/2022   BUN 15 12/10/2022   CREATININE 0.82 12/10/2022   BILITOT 0.4 12/10/2022   ALKPHOS 102 12/10/2022   AST 16 12/10/2022   ALT 14 12/10/2022   PROT 6.5 12/10/2022   ALBUMIN 4.0 12/10/2022   CALCIUM 9.9 12/10/2022   ANIONGAP 9 01/10/2018   EGFR 79 12/10/2022   Lab Results  Component Value Date   CHOL 202 (H) 12/10/2022   Lab Results  Component Value Date   HDL 34 (L) 12/10/2022   Lab Results  Component Value Date   LDLCALC 144 (H) 12/10/2022   Lab Results  Component Value Date   TRIG 134 12/10/2022   Lab Results  Component Value Date   CHOLHDL 5.9 (H) 12/10/2022   Lab Results  Component Value Date   HGBA1C 5.9 (H) 09/14/2022      Assessment & Plan:   Problem List Items Addressed  This Visit       Cardiovascular and Mediastinum   Primary hypertension - Primary   Relevant Orders   Follow up with cardiology as directed Continue meds Labwork pending     Digestive   GERD (gastroesophageal reflux disease) Continue med     Endocrine   Other specified hypothyroidism   Relevant Medications   Continue current med Labwork pending      Musculoskeletal and Integument   Primary osteoarthritis of knee   Relevant Medications   Continue meds     Other   Mixed hyperlipidemia   Relevant Orders   Continue meds  Watch diet Labwork pending      Vitamin D insufficiency   Relevant Medications   Vitamin D, Ergocalciferol, (DRISDOL) 1.25 MG (50000 UNIT) CAPS capsule Labwork pending     Depression with anxiety Continue current medications            CBC, cmp, TSH, lipid, hgb A1c, vit D pending                 No orders of the defined types were placed in this encounter.    Follow-up: Return in about 4 months (around 09/03/2023) for chronic fasting follow-up.    SARA R Angie Hogg, PA-Ch

## 2023-05-04 ENCOUNTER — Other Ambulatory Visit: Payer: Self-pay | Admitting: Physician Assistant

## 2023-05-04 DIAGNOSIS — Z1231 Encounter for screening mammogram for malignant neoplasm of breast: Secondary | ICD-10-CM

## 2023-05-04 LAB — CBC WITH DIFFERENTIAL/PLATELET
Basophils Absolute: 0.1 10*3/uL (ref 0.0–0.2)
Basos: 1 %
EOS (ABSOLUTE): 0.3 10*3/uL (ref 0.0–0.4)
Eos: 3 %
Hematocrit: 40.3 % (ref 34.0–46.6)
Hemoglobin: 13.2 g/dL (ref 11.1–15.9)
Immature Grans (Abs): 0 10*3/uL (ref 0.0–0.1)
Immature Granulocytes: 0 %
Lymphocytes Absolute: 2 10*3/uL (ref 0.7–3.1)
Lymphs: 19 %
MCH: 28.8 pg (ref 26.6–33.0)
MCHC: 32.8 g/dL (ref 31.5–35.7)
MCV: 88 fL (ref 79–97)
Monocytes Absolute: 0.7 10*3/uL (ref 0.1–0.9)
Monocytes: 7 %
Neutrophils Absolute: 7.7 10*3/uL — ABNORMAL HIGH (ref 1.4–7.0)
Neutrophils: 70 %
Platelets: 347 10*3/uL (ref 150–450)
RBC: 4.58 x10E6/uL (ref 3.77–5.28)
RDW: 13.5 % (ref 11.7–15.4)
WBC: 10.8 10*3/uL (ref 3.4–10.8)

## 2023-05-04 LAB — LIPID PANEL
Chol/HDL Ratio: 3.5 ratio (ref 0.0–4.4)
Cholesterol, Total: 118 mg/dL (ref 100–199)
HDL: 34 mg/dL — ABNORMAL LOW (ref >39)
LDL Chol Calc (NIH): 57 mg/dL (ref 0–99)
Triglycerides: 159 mg/dL — ABNORMAL HIGH (ref 0–149)
VLDL Cholesterol Cal: 27 mg/dL (ref 5–40)

## 2023-05-04 LAB — COMPREHENSIVE METABOLIC PANEL
ALT: 12 IU/L (ref 0–32)
AST: 14 IU/L (ref 0–40)
Albumin: 3.9 g/dL (ref 3.9–4.9)
Alkaline Phosphatase: 107 IU/L (ref 44–121)
BUN/Creatinine Ratio: 22 (ref 12–28)
BUN: 19 mg/dL (ref 8–27)
Bilirubin Total: 0.3 mg/dL (ref 0.0–1.2)
CO2: 21 mmol/L (ref 20–29)
Calcium: 10 mg/dL (ref 8.7–10.3)
Chloride: 107 mmol/L — ABNORMAL HIGH (ref 96–106)
Creatinine, Ser: 0.86 mg/dL (ref 0.57–1.00)
Globulin, Total: 2.3 g/dL (ref 1.5–4.5)
Glucose: 100 mg/dL — ABNORMAL HIGH (ref 70–99)
Potassium: 4.7 mmol/L (ref 3.5–5.2)
Sodium: 142 mmol/L (ref 134–144)
Total Protein: 6.2 g/dL (ref 6.0–8.5)
eGFR: 74 mL/min/{1.73_m2} (ref >59)

## 2023-05-04 LAB — VITAMIN D 25 HYDROXY (VIT D DEFICIENCY, FRACTURES): Vit D, 25-Hydroxy: 43.3 ng/mL (ref 30.0–100.0)

## 2023-05-04 LAB — HEMOGLOBIN A1C
Est. average glucose Bld gHb Est-mCnc: 123 mg/dL
Hgb A1c MFr Bld: 5.9 % — ABNORMAL HIGH (ref 4.8–5.6)

## 2023-05-04 LAB — TSH: TSH: <0.005 u[IU]/mL — ABNORMAL LOW (ref 0.450–4.500)

## 2023-05-05 ENCOUNTER — Other Ambulatory Visit: Payer: Self-pay | Admitting: Physician Assistant

## 2023-05-05 DIAGNOSIS — E039 Hypothyroidism, unspecified: Secondary | ICD-10-CM

## 2023-05-05 MED ORDER — LEVOTHYROXINE SODIUM 150 MCG PO TABS
150.0000 ug | ORAL_TABLET | Freq: Every day | ORAL | 1 refills | Status: DC
Start: 2023-05-05 — End: 2023-06-21

## 2023-05-31 ENCOUNTER — Other Ambulatory Visit: Payer: Self-pay | Admitting: Physician Assistant

## 2023-05-31 DIAGNOSIS — E559 Vitamin D deficiency, unspecified: Secondary | ICD-10-CM

## 2023-05-31 DIAGNOSIS — I10 Essential (primary) hypertension: Secondary | ICD-10-CM

## 2023-05-31 DIAGNOSIS — M542 Cervicalgia: Secondary | ICD-10-CM

## 2023-06-09 ENCOUNTER — Inpatient Hospital Stay: Admission: RE | Admit: 2023-06-09 | Payer: Medicare Other | Source: Ambulatory Visit

## 2023-06-11 ENCOUNTER — Other Ambulatory Visit (HOSPITAL_COMMUNITY): Payer: Self-pay

## 2023-06-20 ENCOUNTER — Other Ambulatory Visit: Payer: Self-pay | Admitting: Physician Assistant

## 2023-06-20 DIAGNOSIS — E039 Hypothyroidism, unspecified: Secondary | ICD-10-CM

## 2023-07-12 ENCOUNTER — Other Ambulatory Visit: Payer: Self-pay | Admitting: Physician Assistant

## 2023-07-14 ENCOUNTER — Other Ambulatory Visit: Payer: Self-pay | Admitting: Family Medicine

## 2023-09-08 ENCOUNTER — Ambulatory Visit: Payer: Medicare Other | Admitting: Physician Assistant

## 2023-09-15 ENCOUNTER — Other Ambulatory Visit: Payer: Self-pay | Admitting: Physician Assistant

## 2023-09-15 DIAGNOSIS — E039 Hypothyroidism, unspecified: Secondary | ICD-10-CM

## 2023-09-15 DIAGNOSIS — I10 Essential (primary) hypertension: Secondary | ICD-10-CM

## 2023-09-15 DIAGNOSIS — F418 Other specified anxiety disorders: Secondary | ICD-10-CM

## 2023-09-15 DIAGNOSIS — E559 Vitamin D deficiency, unspecified: Secondary | ICD-10-CM

## 2023-09-15 DIAGNOSIS — M542 Cervicalgia: Secondary | ICD-10-CM

## 2023-09-20 ENCOUNTER — Ambulatory Visit (INDEPENDENT_AMBULATORY_CARE_PROVIDER_SITE_OTHER): Payer: Medicare Other | Admitting: Physician Assistant

## 2023-09-20 ENCOUNTER — Encounter: Payer: Self-pay | Admitting: Physician Assistant

## 2023-09-20 VITALS — BP 138/82 | HR 88 | Temp 97.8°F | Ht 68.0 in | Wt 271.0 lb

## 2023-09-20 DIAGNOSIS — R7303 Prediabetes: Secondary | ICD-10-CM

## 2023-09-20 DIAGNOSIS — G44309 Post-traumatic headache, unspecified, not intractable: Secondary | ICD-10-CM

## 2023-09-20 DIAGNOSIS — I10 Essential (primary) hypertension: Secondary | ICD-10-CM

## 2023-09-20 DIAGNOSIS — I110 Hypertensive heart disease with heart failure: Secondary | ICD-10-CM

## 2023-09-20 DIAGNOSIS — E039 Hypothyroidism, unspecified: Secondary | ICD-10-CM | POA: Diagnosis not present

## 2023-09-20 DIAGNOSIS — M792 Neuralgia and neuritis, unspecified: Secondary | ICD-10-CM

## 2023-09-20 DIAGNOSIS — F418 Other specified anxiety disorders: Secondary | ICD-10-CM | POA: Diagnosis not present

## 2023-09-20 DIAGNOSIS — S0990XS Unspecified injury of head, sequela: Secondary | ICD-10-CM

## 2023-09-20 DIAGNOSIS — Z23 Encounter for immunization: Secondary | ICD-10-CM | POA: Diagnosis not present

## 2023-09-20 DIAGNOSIS — E559 Vitamin D deficiency, unspecified: Secondary | ICD-10-CM

## 2023-09-20 DIAGNOSIS — I272 Pulmonary hypertension, unspecified: Secondary | ICD-10-CM

## 2023-09-20 DIAGNOSIS — I509 Heart failure, unspecified: Secondary | ICD-10-CM

## 2023-09-20 DIAGNOSIS — E782 Mixed hyperlipidemia: Secondary | ICD-10-CM

## 2023-09-20 MED ORDER — TRAMADOL HCL 50 MG PO TABS: 50.0000 mg | ORAL_TABLET | Freq: Every day | ORAL | 0 refills | Status: DC | PRN

## 2023-09-20 NOTE — Progress Notes (Signed)
Established Patient Office Visit  Subjective:  Patient ID: Kathleen [name], female    DOB: 07-09-1956  Age: 67 y.o. MRN: [id number]  CC:  Chief Complaint  Patient presents with   Medical Management of Chronic Issues    HPI [name] presents for follow up of chronic medical problems -  Pt with hypertension and CHF - pt is currently taking norvasc 10mg , lasix 40mg , and zestril 40mg   Pt is overdue for follow up with cardiology and never called back with who she wanted to see - does agree for referral today Pt denies chest pain/dyspnea / edema  Pt with  hypothyroidism - patient is currently taking synthroid qd - she is overdue for labwork by a few months Voices no problems  Pt has history of chronic pain in her right knee after having knee replacement surgery years ago - states she uses tramadol prn for pain - requests refill of medication  Pt with history of GERD - is taking prilosec 40 qd which controls her symptoms - denies any GERD symptoms  Pt has history of anxiety with mild depression - she states she is doing well on her current meds of buspar and cymbalta but having some breakthrough dealing with taking care of her father - she however does not want to change current therapy  Pt with history of hyperlipidemia and coronary artery disease - she is currently taking crestor 40mg  every day - overdue for follow up with cardiology  Pt with history of Vit D deficiency -  - is taking weekly supplement  Pt states that several years ago she was told she had TIAs .  She has noted in the past several months she feels tingling in her head which causes a headache and slight change in her vision.  She states the spells happen 2-3 times daily lasting about a minute then goes away.  No acute changes today She feels this is stemming from an old head injury from last year  Past Medical History:  Diagnosis Date   Anxiety 12/28/2019   Arthritis    CAD in native artery 04/28/2016    Cancer (HCC)    Chronic back pain    Chronic pain of right knee 12/28/2019   Complication of anesthesia    Woke up during  Hysterectomy   Coronary artery disease 11/02/2010   Mild to Moderate   Dyslipidemia 04/28/2016   Encounter for follow-up surveillance of thyroid cancer 06/21/2018   Essential hypertension 04/28/2016   Family history of adverse reaction to anesthesia    GERD (gastroesophageal reflux disease)    omeprazole prn   GERD without esophagitis 12/28/2019   Headache with remote history of traumatic head injury 12/28/2019   History of blood transfusion    Hx of thyroid cancer    Hyperglycemia 06/19/2021   Hyperlipidemia    Hypertension    Hypothyroidism    Mixed hyperlipidemia    Morbid obesity (HCC) 12/28/2019   Murmur 08/03/2012   Nasal congestion 04/24/2021   Need for prophylactic vaccination and inoculation against influenza 09/24/2020   Other chest pain 04/24/2021   Other specified hypothyroidism 03/27/2020   Palpitations 08/03/2012   PONV (postoperative nausea and vomiting)    with thyroid surgery   Preop cardiovascular exam 04/28/2016   Primary hypertension 06/19/2021   Primary osteoarthritis of knee 04/28/2016   Pulmonary hypertension, unspecified (HCC)    Retrocalcaneal bone spur 06/01/2017   Sleep apnea, obstructive    possible   Status post  total right knee replacement 05/01/2016   Tendonitis, Achilles, left 06/01/2017   Unstable angina (HCC) 01/11/2018   Added automatically from request for surgery [id number]   Visit for screening mammogram 04/15/2015   Vitamin D insufficiency 03/27/2020    Past Surgical History:  Procedure Laterality Date   ABDOMINAL HYSTERECTOMY  1999   CARDIAC CATHETERIZATION  Feb 2012   LAD: 60% mid (FFR ratio 0.83), LCX: 30-40%, RCA 20%.    KNEE ARTHROPLASTY Right 05/01/2016   Procedure:  TOTAL KNEE ARTHROPLASTY;  Surgeon: Eldred Manges, MD;  Location: [facility] OR;  Service: Orthopedics;  Laterality: Right;   RIGHT/LEFT HEART CATH AND CORONARY  ANGIOGRAPHY Bilateral 01/17/2018   Procedure: RIGHT/LEFT HEART CATH AND CORONARY ANGIOGRAPHY;  Surgeon: Iran Ouch, MD;  Location: [facility] INVASIVE CV LAB;  Service: Cardiovascular;  Laterality: Bilateral;   THYROIDECTOMY  2000    Family History  Problem Relation Age of Onset   Diabetes Mother     Social History   Socioeconomic History   Marital status: Divorced    Spouse name: Not on file   Number of children: Not on file   Years of education: Not on file   Highest education level: Associate degree: academic program  Occupational History   Not on file  Tobacco Use   Smoking status: Former    Current packs/day: 0.00    Average packs/day: 0.5 packs/day for 20.0 years (10.0 ttl pk-yrs)    Types: Cigarettes    Start date: 34    Quit date: 2014    Years since quitting: 10.8   Smokeless tobacco: Never   Tobacco comments:    quit 2012  Vaping Use   Vaping status: Never Used  Substance and Sexual Activity   Alcohol use: Not Currently    Alcohol/week: 1.0 standard drink of alcohol    Types: 1 Glasses of wine per week   Drug use: No   Sexual activity: Not Currently  Other Topics Concern   Not on file  Social History Narrative   Not on file   Social Determinants of Health   Financial Resource Strain: Low Risk  (09/04/2023)   Overall Financial Resource Strain (CARDIA)    Difficulty of Paying Living Expenses: Not hard at all  Food Insecurity: No Food Insecurity (09/04/2023)   Hunger Vital Sign    Worried About Running Out of Food in the Last Year: Never true    Ran Out of Food in the Last Year: Never true  Transportation Needs: No Transportation Needs (09/04/2023)   PRAPARE - Administrator, Civil Service (Medical): No    Lack of Transportation (Non-Medical): No  Physical Activity: Unknown (09/04/2023)   Exercise Vital Sign    Days of Exercise per Week: Patient declined    Minutes of Exercise per Session: 0 min  Stress: Stress Concern Present (09/04/2023)    [employer] of Occupational Health - Occupational Stress Questionnaire    Feeling of Stress : Rather much  Social Connections: Moderately Integrated (09/04/2023)   Social Connection and Isolation Panel [NHANES]    Frequency of Communication with Friends and Family: More than three times a week    Frequency of Social Gatherings with Friends and Family: More than three times a week    Attends Religious Services: 1 to 4 times per year    Active Member of [employer] or Organizations: No    Attends Banker Meetings: Never    Marital Status: Married  Catering manager Violence: Not At  Risk (05/03/2023)   Humiliation, Afraid, Rape, and Kick questionnaire    Fear of Current or Ex-Partner: No    Emotionally Abused: No    Physically Abused: No    Sexually Abused: No     Current Outpatient Medications:    amLODipine (NORVASC) 10 MG tablet, Take 1 tablet by mouth once daily., Disp: 30 tablet, Rfl: 1   aspirin EC 81 MG tablet, Take 81 mg by mouth daily. Swallow whole., Disp: , Rfl:    budesonide-formoterol (SYMBICORT) 160-4.5 MCG/ACT inhaler, Inhale 2 puffs into the lungs 2 (two) times daily., Disp: 1 each, Rfl: 3   busPIRone (BUSPAR) 15 MG tablet, 1/2 po bid, Disp: 30 tablet, Rfl: 2   cyclobenzaprine (FLEXERIL) 5 MG tablet, Take 1 tablet by mouth once daily at bedtime, Disp: 30 tablet, Rfl: 1   DULoxetine (CYMBALTA) 60 MG capsule, Take 1 capsule (60 mg total) by mouth 2 (two) times daily., Disp: 60 capsule, Rfl: 1   furosemide (LASIX) 40 MG tablet, Take 1 tablet (40 mg total) by mouth daily., Disp: 30 tablet, Rfl: 0   lisinopril (ZESTRIL) 40 MG tablet, Take 1 tablet by mouth once daily., Disp: 30 tablet, Rfl: 1   meloxicam (MOBIC) 15 MG tablet, Take 1 tablet by mouth once daily., Disp: 30 tablet, Rfl: 1   omeprazole (PRILOSEC) 40 MG capsule, Take 1 capsule by mouth twice a day., Disp: 60 capsule, Rfl: 1   oxybutynin (DITROPAN-XL) 10 MG 24 hr tablet, Take 1 tablet by mouth once daily at  bedtime., Disp: 30 tablet, Rfl: 3   rosuvastatin (CRESTOR) 40 MG tablet, Take 1 tablet by mouth once daily., Disp: 90 tablet, Rfl: 3   SYNTHROID 150 MCG tablet, Take 1 tablet by mouth daily before breakfast., Disp: 30 tablet, Rfl: 3   Vitamin D, Ergocalciferol, 50000 units CAPS, Take 1 capsule by mouth once every 7 days as directed, Disp: 5 capsule, Rfl: 3   traMADol (ULTRAM) 50 MG tablet, Take 1 tablet (50 mg total) by mouth daily as needed., Disp: 30 tablet, Rfl: 0   Allergies  Allergen Reactions   Hydrocodone-Acetaminophen Other (See Comments) and Nausea And Vomiting    Headache, allergy to hydrocodone   Latex Hives   Loracarbef Other (See Comments)   Penicillins Other (See Comments)    Syncope Has patient had a PCN reaction causing immediate rash, facial/tongue/throat swelling, SOB or lightheadedness with hypotension: Unknown Has patient had a PCN reaction causing severe rash involving mucus membranes or skin necrosis: No Has patient had a PCN reaction that required hospitalization: No Has patient had a PCN reaction occurring within the last 10 years: Unknown If all of the above answers are "NO", then may proceed with Cephalosporin use.  Syncope Has patient had a PCN reaction causing immediate rash, facial/tongue/throat swelling, SOB or lightheadedness with hypotension: Unknown Has patient had a PCN reaction causing severe rash involving mucus membranes or skin necrosis: No Has patient had a PCN reaction that required hospitalization: No Has patient had a PCN reaction occurring within the last 10 years: Unknown If all of the above answers are "NO", then may proceed with Cephalosporin use.   Hydrocodone-Acetaminophen Other (See Comments)    Headache, allergy to hydrocodone   Prednisone Other (See Comments)    Crazy   CONSTITUTIONAL: Negative for chills, fatigue, fever, unintentional weight gain and unintentional weight loss.  E/N/T: Negative for ear pain, nasal congestion and sore  throat.  CARDIOVASCULAR: Negative for chest pain, dizziness, palpitations and pedal edema.  RESPIRATORY: Negative for recent cough and dyspnea.  GASTROINTESTINAL: Negative for abdominal pain, acid reflux symptoms, constipation, diarrhea, nausea and vomiting.  MSK: Negative for arthralgias and myalgias.  INTEGUMENTARY: Negative for rash.  NEUROLOGICAL: see HPI PSYCHIATRIC: see HPI      Objective:  PHYSICAL EXAM:   VS: BP 138/82 (BP Location: Left Arm, Patient Position: Sitting)   Pulse 88   Temp 97.8 F (36.6 C) (Temporal)   Ht 5\' 8"  (1.727 m)   Wt 271 lb (122.9 kg)   SpO2 98%   BMI 41.21 kg/m   GEN: Well nourished, well developed, in no acute distress   Cardiac: RRR; no murmurs, rubs, or gallops,no edema - Respiratory:  normal respiratory rate and pattern with no distress - normal breath sounds with no rales, rhonchi, wheezes or rubs  MS: no deformity or atrophy  Skin: warm and dry, no rash  Neuro:  Alert and Oriented x 3, Strength and sensation are intact - CN II-Xii grossly intact Psych: euthymic mood, appropriate affect and demeanor    Health Maintenance Due  Topic Date Due   DTaP/Tdap/Td (1 - Tdap) Never done   Zoster Vaccines- Shingrix (1 of 2) Never done   COVID-19 Vaccine (4 - 2023-24 season) 07/04/2023     There are no preventive care reminders to display for this patient.  Lab Results  Component Value Date   TSH <0.005 (L) 05/03/2023   Lab Results  Component Value Date   WBC 10.8 05/03/2023   HGB 13.2 05/03/2023   HCT 40.3 05/03/2023   MCV 88 05/03/2023   PLT 347 05/03/2023   Lab Results  Component Value Date   NA 142 05/03/2023   K 4.7 05/03/2023   CO2 21 05/03/2023   GLUCOSE 100 (H) 05/03/2023   BUN 19 05/03/2023   CREATININE 0.86 05/03/2023   BILITOT 0.3 05/03/2023   ALKPHOS 107 05/03/2023   AST 14 05/03/2023   ALT 12 05/03/2023   PROT 6.2 05/03/2023   ALBUMIN 3.9 05/03/2023   CALCIUM 10.0 05/03/2023   ANIONGAP 9 01/10/2018   EGFR  74 05/03/2023   Lab Results  Component Value Date   CHOL 118 05/03/2023   Lab Results  Component Value Date   HDL 34 (L) 05/03/2023   Lab Results  Component Value Date   LDLCALC 57 05/03/2023   Lab Results  Component Value Date   TRIG 159 (H) 05/03/2023   Lab Results  Component Value Date   CHOLHDL 3.5 05/03/2023   Lab Results  Component Value Date   HGBA1C 5.9 (H) 05/03/2023      Assessment & Plan:   Problem List Items Addressed This Visit       Cardiovascular and Mediastinum   Primary hypertension - Primary   Relevant Orders   Follow up with cardiology as directed and referral made Continue meds Pt will come for labwork next week     Digestive   GERD (gastroesophageal reflux disease) Continue med     Endocrine   Other specified hypothyroidism   Relevant Medications   Continue current med Labwork next week      Musculoskeletal and Integument   Primary osteoarthritis of knee   Relevant Medications   Continue meds     Other   Mixed hyperlipidemia   Relevant Orders   Continue meds  Watch diet Labwork next week      Vitamin D insufficiency   Relevant Medications   Vitamin D, Ergocalciferol, (DRISDOL) 1.25 MG (50000 UNIT) CAPS capsule  Labwork next week     Depression with anxiety Continue current medications      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    Dispense:  30 tablet    Refill:  0    Order Specific Question:   Supervising Provider    Answer:   Marianne Sofia [[id number]]     Follow-up: Return for next week MCR well with Selena Batten and fasting labs ---- me chronic fasting in 4 months.    SARA R Katalena Malveaux, PA-Ch

## 2023-09-28 ENCOUNTER — Other Ambulatory Visit: Payer: Medicare Other

## 2023-10-04 ENCOUNTER — Other Ambulatory Visit: Payer: Medicare Other

## 2023-10-08 ENCOUNTER — Other Ambulatory Visit: Payer: Medicare Other

## 2023-10-11 ENCOUNTER — Other Ambulatory Visit: Payer: Medicare Other

## 2023-10-11 DIAGNOSIS — I10 Essential (primary) hypertension: Secondary | ICD-10-CM

## 2023-10-11 DIAGNOSIS — F418 Other specified anxiety disorders: Secondary | ICD-10-CM

## 2023-10-11 DIAGNOSIS — E559 Vitamin D deficiency, unspecified: Secondary | ICD-10-CM

## 2023-10-11 DIAGNOSIS — E782 Mixed hyperlipidemia: Secondary | ICD-10-CM

## 2023-10-11 DIAGNOSIS — R7303 Prediabetes: Secondary | ICD-10-CM

## 2023-10-11 DIAGNOSIS — E039 Hypothyroidism, unspecified: Secondary | ICD-10-CM

## 2023-10-12 ENCOUNTER — Ambulatory Visit (INDEPENDENT_AMBULATORY_CARE_PROVIDER_SITE_OTHER): Payer: Medicare Other

## 2023-10-12 DIAGNOSIS — Z13820 Encounter for screening for osteoporosis: Secondary | ICD-10-CM | POA: Diagnosis not present

## 2023-10-12 DIAGNOSIS — Z0000 Encounter for general adult medical examination without abnormal findings: Secondary | ICD-10-CM | POA: Diagnosis not present

## 2023-10-12 DIAGNOSIS — Z780 Asymptomatic menopausal state: Secondary | ICD-10-CM | POA: Diagnosis not present

## 2023-10-12 DIAGNOSIS — Z1231 Encounter for screening mammogram for malignant neoplasm of breast: Secondary | ICD-10-CM

## 2023-10-12 LAB — TSH: TSH: 42.8 u[IU]/mL — ABNORMAL HIGH (ref 0.450–4.500)

## 2023-10-12 LAB — CBC WITH DIFFERENTIAL/PLATELET
Basophils Absolute: 0.1 10*3/uL (ref 0.0–0.2)
Basos: 1 %
EOS (ABSOLUTE): 0.5 10*3/uL — ABNORMAL HIGH (ref 0.0–0.4)
Eos: 4 %
Hematocrit: 46.6 % (ref 34.0–46.6)
Hemoglobin: 15 g/dL (ref 11.1–15.9)
Immature Grans (Abs): 0 10*3/uL (ref 0.0–0.1)
Immature Granulocytes: 0 %
Lymphocytes Absolute: 3.2 10*3/uL — ABNORMAL HIGH (ref 0.7–3.1)
Lymphs: 23 %
MCH: 29.5 pg (ref 26.6–33.0)
MCHC: 32.2 g/dL (ref 31.5–35.7)
MCV: 92 fL (ref 79–97)
Monocytes Absolute: 0.6 10*3/uL (ref 0.1–0.9)
Monocytes: 5 %
Neutrophils Absolute: 9.6 10*3/uL — ABNORMAL HIGH (ref 1.4–7.0)
Neutrophils: 67 %
Platelets: 312 10*3/uL (ref 150–450)
RBC: 5.08 x10E6/uL (ref 3.77–5.28)
RDW: 14.7 % (ref 11.7–15.4)
WBC: 14 10*3/uL — ABNORMAL HIGH (ref 3.4–10.8)

## 2023-10-12 LAB — COMPREHENSIVE METABOLIC PANEL
ALT: 18 [IU]/L (ref 0–32)
AST: 19 [IU]/L (ref 0–40)
Albumin: 4.2 g/dL (ref 3.9–4.9)
Alkaline Phosphatase: 113 [IU]/L (ref 44–121)
BUN/Creatinine Ratio: 12 (ref 12–28)
BUN: 14 mg/dL (ref 8–27)
Bilirubin Total: 0.4 mg/dL (ref 0.0–1.2)
CO2: 22 mmol/L (ref 20–29)
Calcium: 10.1 mg/dL (ref 8.7–10.3)
Chloride: 101 mmol/L (ref 96–106)
Creatinine, Ser: 1.14 mg/dL — ABNORMAL HIGH (ref 0.57–1.00)
Globulin, Total: 2.9 g/dL (ref 1.5–4.5)
Glucose: 131 mg/dL — ABNORMAL HIGH (ref 70–99)
Potassium: 4.8 mmol/L (ref 3.5–5.2)
Sodium: 138 mmol/L (ref 134–144)
Total Protein: 7.1 g/dL (ref 6.0–8.5)
eGFR: 53 mL/min/{1.73_m2} — ABNORMAL LOW (ref >59)

## 2023-10-12 LAB — LIPID PANEL
Chol/HDL Ratio: 3.4 {ratio} (ref 0.0–4.4)
Cholesterol, Total: 141 mg/dL (ref 100–199)
HDL: 42 mg/dL (ref >39)
LDL Chol Calc (NIH): 74 mg/dL (ref 0–99)
Triglycerides: 140 mg/dL (ref 0–149)
VLDL Cholesterol Cal: 25 mg/dL (ref 5–40)

## 2023-10-12 LAB — HEMOGLOBIN A1C
Est. average glucose Bld gHb Est-mCnc: 134 mg/dL
Hgb A1c MFr Bld: 6.3 % — ABNORMAL HIGH (ref 4.8–5.6)

## 2023-10-12 LAB — VITAMIN D 25 HYDROXY (VIT D DEFICIENCY, FRACTURES): Vit D, 25-Hydroxy: 57.6 ng/mL (ref 30.0–100.0)

## 2023-10-12 NOTE — Progress Notes (Signed)
Subjective:   Kathleen [name] is a 67 y.o. female who presents for Medicare Annual (Subsequent) preventive examination.  Visit Complete: Virtual I connected with  Gerrit Heck on 10/12/23 by a audio enabled telemedicine application and verified that I am speaking with the correct person using two identifiers.  Patient Location: Home  Provider Location: Office/Clinic  I discussed the limitations of evaluation and management by telemedicine. The patient expressed understanding and agreed to proceed.  Vital Signs: Because this visit was a virtual/telehealth visit, some criteria may be missing or patient reported. Any vitals not documented were not able to be obtained and vitals that have been documented are patient reported.  Patient Medicare AWV questionnaire was completed by the patient on 10/12/23; I have confirmed that all information answered by patient is correct and no changes since this date.        Objective:    There were no vitals filed for this visit. There is no height or weight on file to calculate BMI.     10/12/2023    9:09 AM 01/17/2018    8:32 AM 01/10/2018   11:54 AM 05/01/2016    4:09 PM 05/01/2016    5:56 AM 04/17/2016   10:36 AM  Advanced Directives  Does Patient Have a Medical Advance Directive? Yes No No Yes No Yes  Type of Advance Directive Living will;Healthcare Power of Attorney   Living will  Healthcare Power of [place];Living will  Does patient want to make changes to medical advance directive? No - Patient declined       Copy of Healthcare Power of Attorney in Chart?      No - copy requested  Would patient like information on creating a medical advance directive? No - Patient declined No - Patient declined   No - patient declined information     Current Medications (verified) Outpatient Encounter Medications as of 10/12/2023  Medication Sig   amLODipine (NORVASC) 10 MG tablet Take 1 tablet by mouth once daily.   aspirin EC 81 MG tablet Take  81 mg by mouth daily. Swallow whole.   budesonide-formoterol (SYMBICORT) 160-4.5 MCG/ACT inhaler Inhale 2 puffs into the lungs 2 (two) times daily.   busPIRone (BUSPAR) 15 MG tablet 1/2 po bid   cyclobenzaprine (FLEXERIL) 5 MG tablet Take 1 tablet by mouth once daily at bedtime   DULoxetine (CYMBALTA) 60 MG capsule Take 1 capsule (60 mg total) by mouth 2 (two) times daily.   furosemide (LASIX) 40 MG tablet Take 1 tablet (40 mg total) by mouth daily.   lisinopril (ZESTRIL) 40 MG tablet Take 1 tablet by mouth once daily.   meloxicam (MOBIC) 15 MG tablet Take 1 tablet by mouth once daily.   omeprazole (PRILOSEC) 40 MG capsule Take 1 capsule by mouth twice a day.   oxybutynin (DITROPAN-XL) 10 MG 24 hr tablet Take 1 tablet by mouth once daily at bedtime.   rosuvastatin (CRESTOR) 40 MG tablet Take 1 tablet by mouth once daily.   SYNTHROID 150 MCG tablet Take 1 tablet by mouth daily before breakfast.   traMADol (ULTRAM) 50 MG tablet Take 1 tablet (50 mg total) by mouth daily as needed.   Vitamin D, Ergocalciferol, 50000 units CAPS Take 1 capsule by mouth once every 7 days as directed   No facility-administered encounter medications on file as of 10/12/2023.    Allergies (verified) Hydrocodone-acetaminophen, Latex, Loracarbef, Penicillins, Hydrocodone-acetaminophen, and Prednisone   History: Past Medical History:  Diagnosis Date   Anxiety 12/28/2019  Arthritis    CAD in native artery 04/28/2016   Cancer (HCC)    Chronic back pain    Chronic pain of right knee 12/28/2019   Complication of anesthesia    Woke up during  Hysterectomy   Coronary artery disease 11/02/2010   Mild to Moderate   Dyslipidemia 04/28/2016   Encounter for follow-up surveillance of thyroid cancer 06/21/2018   Essential hypertension 04/28/2016   Family history of adverse reaction to anesthesia    GERD (gastroesophageal reflux disease)    omeprazole prn   GERD without esophagitis 12/28/2019   Headache with remote history  of traumatic head injury 12/28/2019   History of blood transfusion    Hx of thyroid cancer    Hyperglycemia 06/19/2021   Hyperlipidemia    Hypertension    Hypothyroidism    Mixed hyperlipidemia    Morbid obesity (HCC) 12/28/2019   Murmur 08/03/2012   Nasal congestion 04/24/2021   Need for prophylactic vaccination and inoculation against influenza 09/24/2020   Other chest pain 04/24/2021   Other specified hypothyroidism 03/27/2020   Palpitations 08/03/2012   PONV (postoperative nausea and vomiting)    with thyroid surgery   Preop cardiovascular exam 04/28/2016   Primary hypertension 06/19/2021   Primary osteoarthritis of knee 04/28/2016   Pulmonary hypertension, unspecified (HCC)    Retrocalcaneal bone spur 06/01/2017   Sleep apnea, obstructive    possible   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    KNEE ARTHROPLASTY Right 05/01/2016   Procedure:  TOTAL KNEE ARTHROPLASTY;  Surgeon: Eldred Manges, MD;  Location: [facility] OR;  Service: Orthopedics;  Laterality: Right;   RIGHT/LEFT HEART CATH AND CORONARY ANGIOGRAPHY Bilateral 01/17/2018   Procedure: RIGHT/LEFT HEART CATH AND CORONARY ANGIOGRAPHY;  Surgeon: Iran Ouch, MD;  Location: [facility] INVASIVE CV LAB;  Service: Cardiovascular;  Laterality: Bilateral;   THYROIDECTOMY  2000   Family History  Problem Relation Age of Onset   Diabetes Mother    Social History   Socioeconomic History   Marital status: Divorced    Spouse name: Not on file   Number of children: Not on file   Years of education: Not on file   Highest education level: Associate degree:  academic program  Occupational History   Not on file  Tobacco Use   Smoking status: Former    Current packs/day: 0.00    Average packs/day: 0.5 packs/day for 20.0 years (10.0 ttl pk-yrs)    Types: Cigarettes    Start date: 72    Quit date: 2014    Years since quitting: 10.9   Smokeless tobacco: Never   Tobacco comments:    quit 2012  Vaping Use   Vaping status: Never Used  Substance and Sexual Activity   Alcohol use: Not Currently    Alcohol/week: 1.0 standard drink of alcohol    Types: 1 Glasses of wine per week   Drug use: No   Sexual activity: Not Currently  Other Topics Concern   Not on file  Social History Narrative   Not on file   Social Determinants of Health  Financial Resource Strain: Low Risk  (10/12/2023)   Overall Financial Resource Strain (CARDIA)    Difficulty of Paying Living Expenses: Not hard at all  Food Insecurity: Food Insecurity Present (10/12/2023)   Hunger Vital Sign    Worried About Running Out of Food in the Last Year: Sometimes true    Ran Out of Food in the Last Year: Never true  Transportation Needs: No Transportation Needs (10/12/2023)   PRAPARE - Administrator, Civil Service (Medical): No    Lack of Transportation (Non-Medical): No  Physical Activity: Inactive (10/12/2023)   Exercise Vital Sign    Days of Exercise per Week: 0 days    Minutes of Exercise per Session: 0 min  Stress: Stress Concern Present (10/12/2023)   [employer] of Occupational Health - Occupational Stress Questionnaire    Feeling of Stress : To some extent  Social Connections: Moderately Isolated (10/12/2023)   Social Connection and Isolation Panel [NHANES]    Frequency of Communication with Friends and Family: More than three times a week    Frequency of Social Gatherings with Friends and Family: More than three times a week    Attends Religious Services: 1 to 4 times per year    Active Member of [employer] or Organizations: No    Attends Tax inspector Meetings: Never    Marital Status: Divorced    Tobacco Counseling Counseling given: Not Answered Tobacco comments: quit 2012   Clinical Intake:              How often do you need to have someone help you when you read instructions, pamphlets, or other written materials from your doctor or pharmacy?: 1 - Never         Activities of Daily Living    10/12/2023    8:57 AM  In your present state of health, do you have any difficulty performing the following activities:  Hearing? 1  Vision? 1  Difficulty concentrating or making decisions? 1  Walking or climbing stairs? 1  Dressing or bathing? 0  Doing errands, shopping? 0  Preparing Food and eating ? N  Using the Toilet? N  In the past six months, have you accidently leaked urine? Y  Do you have problems with loss of bowel control? N  Managing your Medications? N  Managing your Finances? N  Housekeeping or managing your Housekeeping? N    Patient Care Team: Marianne Sofia, Cordelia Poche as PCP - General (Physician Assistant)  Indicate any recent Medical Services you may have received from other than [facility] providers in the past year (date may be approximate).     Assessment:   This is a routine wellness examination for [facility].  Hearing/Vision screen No results found.   Goals Addressed   None   Depression Screen    10/12/2023    9:08 AM 09/20/2023   11:32 AM 05/03/2023    8:29 AM 04/08/2023    9:37 AM 12/10/2022    8:17 AM 07/30/2022    8:33 AM 10/21/2021   11:06 AM  PHQ 2/9 Scores  PHQ - 2 Score 3 3 3 4  0 0 0  PHQ- 9 Score 9 10 9 13 4  0 0    Fall Risk    10/12/2023    8:57 AM 09/20/2023   11:32 AM 05/03/2023    8:29 AM 12/10/2022    8:21 AM 07/30/2022    8:33 AM  Fall Risk   Falls in the past year? 1 1  0 0 0  Number falls in past yr: 1 0 0 0 0  Injury with Fall? 0 0 0 0 0  Risk for fall due to :  No Fall Risks No Fall Risks No Fall Risks No Fall Risks  Follow up  Falls evaluation completed Follow up  appointment Falls evaluation completed Falls evaluation completed    MEDICARE RISK AT HOME: Medicare Risk at Home Any stairs in or around the home?: Yes If so, are there any without handrails?: No Home free of loose throw rugs in walkways, pet beds, electrical cords, etc?: Yes Adequate lighting in your home to reduce risk of falls?: Yes Life alert?: No Use of a cane, walker or w/c?: Yes Grab bars in the bathroom?: Yes Shower chair or bench in shower?: Yes Elevated toilet seat or a handicapped toilet?: No  TIMED UP AND GO:  Was the test performed?  No    Cognitive Function:        10/12/2023    9:21 AM  6CIT Screen  What Year? 0 points  What month? 0 points  What time? 0 points  Count back from 20 0 points  Months in reverse 0 points  Repeat phrase 2 points  Total Score 2 points    Immunizations Immunization History  Administered Date(s) Administered   Fluad Quad(high Dose 65+) 09/10/2021   Fluad Trivalent(High Dose 65+) 09/20/2023   Influenza Inj Mdck Quad Pf 09/24/2020   PFIZER(Purple Top)SARS-COV-2 Vaccination 06/06/2020, 06/28/2020   Pfizer Covid-19 Vaccine Bivalent Booster 78yrs & up 09/10/2021    TDAP status: Due, Education has been provided regarding the importance of this vaccine. Advised may receive this vaccine at local pharmacy or Health Dept. Aware to provide a copy of the vaccination record if obtained from local pharmacy or Health Dept. Verbalized acceptance and understanding.  Flu Vaccine status: Up to date   Covid-19 vaccine status: Completed vaccines  Qualifies for Shingles Vaccine? Yes   Zostavax completed No   Shingrix Completed?: No.    Education has been provided regarding the importance of this vaccine. Patient has been advised to call insurance company to determine out of pocket expense if they have not yet received this vaccine. Advised may also receive vaccine at local pharmacy or Health Dept. Verbalized acceptance and  understanding.  Screening Tests Health Maintenance  Topic Date Due   DTaP/Tdap/Td (1 - Tdap) Never done   Zoster Vaccines- Shingrix (1 of 2) Never done   COVID-19 Vaccine (4 - 2023-24 season) 07/04/2023   Pneumonia Vaccine 71+ Years old (1 of 2 - PCV) 05/02/2024 (Originally 06/04/1962)   MAMMOGRAM  05/02/2024 (Originally 10/28/2022)   DEXA SCAN  05/02/2024 (Originally 06/04/2021)   Fecal DNA (Cologuard)  09/19/2024   Medicare Annual Wellness (AWV)  10/11/2024   INFLUENZA VACCINE  Completed   HPV VACCINES  Aged Out   Hepatitis C Screening  Discontinued    Health Maintenance  Health Maintenance Due  Topic Date Due   DTaP/Tdap/Td (1 - Tdap) Never done   Zoster Vaccines- Shingrix (1 of 2) Never done   COVID-19 Vaccine (4 - 2023-24 season) 07/04/2023    Colorectal cancer screening: Type of screening: Cologuard. Completed 09/19/2021. Repeat every 3 years  Mammogram status: Ordered 05/04/2023. Pt provided with contact info and advised to call to schedule appt.   Bone Density status: Ordered 10/12/2023. Pt provided with contact info and advised to call to schedule appt.  Lung Cancer Screening: (Low Dose CT Chest recommended if Age 56-80 years,  20 pack-year currently smoking OR have quit w/in 15years.) does qualify.   Lung Cancer Screening Referral: Patient denied  Additional Screening:  Hepatitis C Screening: does not qualify;   Vision Screening: Recommended annual ophthalmology exams for early detection of glaucoma and other disorders of the eye. Is the patient up to date with their annual eye exam?  Yes  Who is the provider or what is the name of the office in which the patient attends annual eye exams? [facility] If pt is not established with a provider, would they like to be referred to a provider to establish care? No .   Dental Screening: Recommended annual dental exams for proper oral hygiene   Community Resource Referral / Chronic Care Management: CRR required this  visit?  No   CCM required this visit?  No     Plan:     I have personally reviewed and noted the following in the patient's chart:   Medical and social history Use of alcohol, tobacco or illicit drugs  Current medications and supplements including opioid prescriptions. Patient is currently taking opioid prescriptions. Information provided to patient regarding non-opioid alternatives. Patient advised to discuss non-opioid treatment plan with their provider. Functional ability and status Nutritional status Physical activity Advanced directives List of other physicians Hospitalizations, surgeries, and ER visits in previous 12 months Vitals Screenings to include cognitive, depression, and falls Referrals and appointments  In addition, I have reviewed and discussed with patient certain preventive protocols, quality metrics, and best practice recommendations. A written personalized care plan for preventive services as well as general preventive health recommendations were provided to patient.     Jomarie Longs, CMA   10/12/2023   After Visit Summary: (MyChart) Due to this being a telephonic visit, the after visit summary with patients personalized plan was offered to patient via MyChart   Nurse Notes: Patient denied CT lung screening, order has been put in for mammogram and Dexa to be schedule in March per patient request.

## 2023-10-13 ENCOUNTER — Other Ambulatory Visit: Payer: Self-pay

## 2023-10-13 DIAGNOSIS — E038 Other specified hypothyroidism: Secondary | ICD-10-CM

## 2023-10-28 ENCOUNTER — Ambulatory Visit: Payer: Medicare Other

## 2023-11-05 ENCOUNTER — Ambulatory Visit: Payer: Medicare Other

## 2023-11-05 DIAGNOSIS — E038 Other specified hypothyroidism: Secondary | ICD-10-CM

## 2023-11-06 LAB — TSH: TSH: 18.5 u[IU]/mL — ABNORMAL HIGH (ref 0.450–4.500)

## 2023-11-08 ENCOUNTER — Other Ambulatory Visit: Payer: Self-pay | Admitting: Physician Assistant

## 2023-11-08 DIAGNOSIS — E038 Other specified hypothyroidism: Secondary | ICD-10-CM

## 2023-11-08 MED ORDER — LEVOTHYROXINE SODIUM 175 MCG PO TABS: 175.0000 ug | ORAL_TABLET | Freq: Every day | ORAL | 3 refills | Status: DC

## 2023-11-19 ENCOUNTER — Encounter: Payer: Self-pay | Admitting: Cardiology

## 2023-12-15 ENCOUNTER — Other Ambulatory Visit: Payer: Self-pay

## 2023-12-15 DIAGNOSIS — M542 Cervicalgia: Secondary | ICD-10-CM

## 2023-12-15 MED ORDER — LISINOPRIL 40 MG PO TABS: 40.0000 mg | ORAL_TABLET | Freq: Every day | ORAL | 1 refills | Status: DC

## 2023-12-15 MED ORDER — MELOXICAM 15 MG PO TABS: 15.0000 mg | ORAL_TABLET | Freq: Every day | ORAL | 1 refills | Status: DC

## 2023-12-15 MED ORDER — OMEPRAZOLE 40 MG PO CPDR: 40.0000 mg | DELAYED_RELEASE_CAPSULE | Freq: Two times a day (BID) | ORAL | 1 refills | Status: DC

## 2023-12-17 ENCOUNTER — Other Ambulatory Visit: Payer: Self-pay

## 2023-12-17 DIAGNOSIS — E559 Vitamin D deficiency, unspecified: Secondary | ICD-10-CM

## 2023-12-17 DIAGNOSIS — I10 Essential (primary) hypertension: Secondary | ICD-10-CM

## 2023-12-17 DIAGNOSIS — M542 Cervicalgia: Secondary | ICD-10-CM

## 2023-12-17 DIAGNOSIS — F418 Other specified anxiety disorders: Secondary | ICD-10-CM

## 2023-12-17 DIAGNOSIS — E038 Other specified hypothyroidism: Secondary | ICD-10-CM

## 2023-12-20 ENCOUNTER — Other Ambulatory Visit: Payer: Self-pay | Admitting: Physician Assistant

## 2023-12-20 MED ORDER — LEVOTHYROXINE SODIUM 175 MCG PO TABS: 175.0000 ug | ORAL_TABLET | Freq: Every day | ORAL | 0 refills | Status: DC

## 2023-12-20 MED ORDER — CYCLOBENZAPRINE HCL 5 MG PO TABS: 5.0000 mg | ORAL_TABLET | Freq: Every day | ORAL | 1 refills | Status: DC

## 2023-12-20 MED ORDER — MELOXICAM 15 MG PO TABS: 15.0000 mg | ORAL_TABLET | Freq: Every day | ORAL | 1 refills | Status: DC

## 2023-12-20 MED ORDER — VITAMIN D (ERGOCALCIFEROL) 50000 UNITS PO CAPS: 1.0000 | ORAL_CAPSULE | ORAL | 3 refills | Status: DC

## 2023-12-20 MED ORDER — DULOXETINE HCL 60 MG PO CPEP: 60.0000 mg | ORAL_CAPSULE | Freq: Two times a day (BID) | ORAL | 1 refills | Status: DC

## 2023-12-20 MED ORDER — AMLODIPINE BESYLATE 10 MG PO TABS
10.0000 mg | ORAL_TABLET | Freq: Every day | ORAL | 1 refills | Status: DC
Start: 2023-12-20 — End: 2024-01-05

## 2023-12-20 MED ORDER — OMEPRAZOLE 40 MG PO CPDR
40.0000 mg | DELAYED_RELEASE_CAPSULE | Freq: Two times a day (BID) | ORAL | 1 refills | Status: DC
Start: 2023-12-20 — End: 2024-01-05

## 2023-12-31 ENCOUNTER — Other Ambulatory Visit: Payer: Self-pay

## 2023-12-31 MED ORDER — OXYBUTYNIN CHLORIDE ER 10 MG PO TB24: 10.0000 mg | ORAL_TABLET | Freq: Every day | ORAL | 0 refills | Status: DC

## 2023-12-31 MED ORDER — ROSUVASTATIN CALCIUM 40 MG PO TABS: 40.0000 mg | ORAL_TABLET | Freq: Every day | ORAL | 0 refills | Status: DC

## 2024-01-05 ENCOUNTER — Other Ambulatory Visit: Payer: Self-pay

## 2024-01-05 DIAGNOSIS — E038 Other specified hypothyroidism: Secondary | ICD-10-CM

## 2024-01-05 DIAGNOSIS — M542 Cervicalgia: Secondary | ICD-10-CM

## 2024-01-05 DIAGNOSIS — F418 Other specified anxiety disorders: Secondary | ICD-10-CM

## 2024-01-05 DIAGNOSIS — I10 Essential (primary) hypertension: Secondary | ICD-10-CM

## 2024-01-05 DIAGNOSIS — E559 Vitamin D deficiency, unspecified: Secondary | ICD-10-CM

## 2024-01-07 MED ORDER — ROSUVASTATIN CALCIUM 40 MG PO TABS: 40.0000 mg | ORAL_TABLET | Freq: Every day | ORAL | 1 refills | Status: DC

## 2024-01-07 MED ORDER — BUSPIRONE HCL 7.5 MG PO TABS: ORAL_TABLET | ORAL | 1 refills | Status: DC

## 2024-01-07 MED ORDER — CYCLOBENZAPRINE HCL 5 MG PO TABS: 5.0000 mg | ORAL_TABLET | Freq: Every day | ORAL | 1 refills | Status: DC

## 2024-01-07 MED ORDER — VITAMIN D (ERGOCALCIFEROL) 50000 UNITS PO CAPS
1.0000 | ORAL_CAPSULE | ORAL | 1 refills | Status: DC
Start: 2024-01-07 — End: 2024-01-20

## 2024-01-07 MED ORDER — LISINOPRIL 40 MG PO TABS: 40.0000 mg | ORAL_TABLET | Freq: Every day | ORAL | 1 refills | Status: DC

## 2024-01-07 MED ORDER — MELOXICAM 15 MG PO TABS: 15.0000 mg | ORAL_TABLET | Freq: Every day | ORAL | 1 refills | Status: DC

## 2024-01-07 MED ORDER — BUDESONIDE-FORMOTEROL FUMARATE 160-4.5 MCG/ACT IN AERO: 2.0000 | INHALATION_SPRAY | Freq: Two times a day (BID) | RESPIRATORY_TRACT | 1 refills | Status: DC

## 2024-01-07 MED ORDER — TRAMADOL HCL 50 MG PO TABS: 50.0000 mg | ORAL_TABLET | Freq: Every day | ORAL | 0 refills | Status: DC | PRN

## 2024-01-07 MED ORDER — LEVOTHYROXINE SODIUM 175 MCG PO TABS: 175.0000 ug | ORAL_TABLET | Freq: Every day | ORAL | 0 refills | Status: DC

## 2024-01-07 MED ORDER — FUROSEMIDE 40 MG PO TABS: 40.0000 mg | ORAL_TABLET | Freq: Every day | ORAL | 0 refills | Status: DC

## 2024-01-07 MED ORDER — OMEPRAZOLE 40 MG PO CPDR: 40.0000 mg | DELAYED_RELEASE_CAPSULE | Freq: Two times a day (BID) | ORAL | 1 refills | Status: DC

## 2024-01-07 MED ORDER — OXYBUTYNIN CHLORIDE ER 10 MG PO TB24: 10.0000 mg | ORAL_TABLET | Freq: Every day | ORAL | 1 refills | Status: DC

## 2024-01-07 MED ORDER — AMLODIPINE BESYLATE 10 MG PO TABS: 10.0000 mg | ORAL_TABLET | Freq: Every day | ORAL | 1 refills | Status: DC

## 2024-01-07 MED ORDER — DULOXETINE HCL 60 MG PO CPEP: 60.0000 mg | ORAL_CAPSULE | Freq: Two times a day (BID) | ORAL | 1 refills | Status: DC

## 2024-01-10 ENCOUNTER — Other Ambulatory Visit: Payer: Self-pay

## 2024-01-10 MED ORDER — BUDESONIDE-FORMOTEROL FUMARATE 160-4.5 MCG/ACT IN AERO: 2.0000 | INHALATION_SPRAY | Freq: Two times a day (BID) | RESPIRATORY_TRACT | 2 refills | Status: DC

## 2024-01-12 ENCOUNTER — Other Ambulatory Visit: Payer: Self-pay | Admitting: Physician Assistant

## 2024-01-12 DIAGNOSIS — M542 Cervicalgia: Secondary | ICD-10-CM

## 2024-01-12 DIAGNOSIS — E038 Other specified hypothyroidism: Secondary | ICD-10-CM

## 2024-01-12 DIAGNOSIS — F418 Other specified anxiety disorders: Secondary | ICD-10-CM

## 2024-01-12 DIAGNOSIS — E559 Vitamin D deficiency, unspecified: Secondary | ICD-10-CM

## 2024-01-12 DIAGNOSIS — I10 Essential (primary) hypertension: Secondary | ICD-10-CM

## 2024-01-12 NOTE — Telephone Encounter (Signed)
 Per chart review, prescriptions that patient requested today were sent to pharmacy on file on 01/07/2024 and confirmed at 1:10 pm same day. HIPAA compliant message left with patient to call back to office to be updated.

## 2024-01-12 NOTE — Telephone Encounter (Signed)
 Copied from CRM [id number]. Topic: Clinical - Medication Refill >> Jan 12, 2024  1:04 PM Gildardo Pounds wrote: Most Recent Primary Care Visit:  Provider: [facility]  Department: [facility]  Visit Type: CLINICAL SUPPORT  Date: 11/05/2023  Medication: amLODipine (NORVASC) 10 MG tablet busPIRone (BUSPAR) 7.5 MG tablet  cyclobenzaprine (FLEXERIL) 5 MG tablet DULoxetine (CYMBALTA) 60 MG capsule furosemide (LASIX) 40 MG tablet  levothyroxine (SYNTHROID) 175 MCG tablet lisinopril (ZESTRIL) 40 MG tablet meloxicam (MOBIC) 15 MG tablet omeprazole (PRILOSEC) 40 MG capsule oxybutynin (DITROPAN-XL) 10 MG 24 hr tablet rosuvastatin (CRESTOR) 40 MG tablet traMADol (ULTRAM) 50 MG tablet Vitamin D, Ergocalciferol, 50000 units CAPS  Has the patient contacted their pharmacy? Yes (Agent: If no, request that the patient contact the pharmacy for the refill. If patient does not wish to contact the pharmacy document the reason why and proceed with request.) (Agent: If yes, when and what did the pharmacy advise?)Refills were sent to the wrong pharmacy  Is this the correct pharmacy for this prescription? Yes If no, delete pharmacy and type the correct one.  This is the patient's preferred pharmacy:  [facility] [place], [place] - [address] [place] [address] Phone: [phone] Fax: [phone]   Has the prescription been filled recently? Yes  Is the patient out of the medication? No  Has the patient been seen for an appointment in the last year OR does the patient have an upcoming appointment? Yes  Can we respond through MyChart? Yes  Agent: Please be advised that Rx refills may take up to 3 business days. We ask that you follow-up with your pharmacy.

## 2024-01-20 ENCOUNTER — Encounter: Payer: Self-pay | Admitting: Physician Assistant

## 2024-01-20 ENCOUNTER — Ambulatory Visit (INDEPENDENT_AMBULATORY_CARE_PROVIDER_SITE_OTHER): Payer: Medicare Other | Admitting: Physician Assistant

## 2024-01-20 VITALS — BP 130/80 | HR 95 | Temp 97.2°F | Ht 68.0 in | Wt 285.6 lb

## 2024-01-20 DIAGNOSIS — N393 Stress incontinence (female) (male): Secondary | ICD-10-CM

## 2024-01-20 DIAGNOSIS — E782 Mixed hyperlipidemia: Secondary | ICD-10-CM | POA: Diagnosis not present

## 2024-01-20 DIAGNOSIS — Z23 Encounter for immunization: Secondary | ICD-10-CM | POA: Diagnosis not present

## 2024-01-20 DIAGNOSIS — R7303 Prediabetes: Secondary | ICD-10-CM | POA: Diagnosis not present

## 2024-01-20 DIAGNOSIS — E559 Vitamin D deficiency, unspecified: Secondary | ICD-10-CM | POA: Diagnosis not present

## 2024-01-20 DIAGNOSIS — J3089 Other allergic rhinitis: Secondary | ICD-10-CM | POA: Diagnosis not present

## 2024-01-20 DIAGNOSIS — M542 Cervicalgia: Secondary | ICD-10-CM | POA: Diagnosis not present

## 2024-01-20 DIAGNOSIS — E038 Other specified hypothyroidism: Secondary | ICD-10-CM

## 2024-01-20 DIAGNOSIS — I10 Essential (primary) hypertension: Secondary | ICD-10-CM | POA: Diagnosis not present

## 2024-01-20 DIAGNOSIS — M25561 Pain in right knee: Secondary | ICD-10-CM | POA: Diagnosis not present

## 2024-01-20 DIAGNOSIS — G8929 Other chronic pain: Secondary | ICD-10-CM

## 2024-01-20 DIAGNOSIS — K219 Gastro-esophageal reflux disease without esophagitis: Secondary | ICD-10-CM

## 2024-01-20 DIAGNOSIS — F418 Other specified anxiety disorders: Secondary | ICD-10-CM | POA: Diagnosis not present

## 2024-01-20 MED ORDER — TRAMADOL HCL 50 MG PO TABS: 50.0000 mg | ORAL_TABLET | Freq: Every day | ORAL | 0 refills | Status: DC | PRN

## 2024-01-20 MED ORDER — ROSUVASTATIN CALCIUM 40 MG PO TABS: 40.0000 mg | ORAL_TABLET | Freq: Every day | ORAL | 1 refills | Status: DC

## 2024-01-20 MED ORDER — FUROSEMIDE 40 MG PO TABS: 40.0000 mg | ORAL_TABLET | Freq: Every day | ORAL | 1 refills | Status: DC

## 2024-01-20 MED ORDER — CYCLOBENZAPRINE HCL 5 MG PO TABS: 5.0000 mg | ORAL_TABLET | Freq: Every day | ORAL | 1 refills | Status: DC

## 2024-01-20 MED ORDER — BUSPIRONE HCL 7.5 MG PO TABS: ORAL_TABLET | ORAL | 1 refills | Status: DC

## 2024-01-20 MED ORDER — ROSUVASTATIN CALCIUM 40 MG PO TABS
40.0000 mg | ORAL_TABLET | Freq: Every day | ORAL | 1 refills | Status: DC
Start: 2024-01-20 — End: 2024-01-20

## 2024-01-20 MED ORDER — OMEPRAZOLE 40 MG PO CPDR: 40.0000 mg | DELAYED_RELEASE_CAPSULE | Freq: Two times a day (BID) | ORAL | 1 refills | Status: DC

## 2024-01-20 MED ORDER — DULOXETINE HCL 60 MG PO CPEP
60.0000 mg | ORAL_CAPSULE | Freq: Two times a day (BID) | ORAL | 1 refills | Status: DC
Start: 2024-01-20 — End: 2024-05-29

## 2024-01-20 MED ORDER — LISINOPRIL 40 MG PO TABS: 40.0000 mg | ORAL_TABLET | Freq: Every day | ORAL | 1 refills | Status: DC

## 2024-01-20 MED ORDER — OXYBUTYNIN CHLORIDE ER 10 MG PO TB24: 10.0000 mg | ORAL_TABLET | Freq: Every day | ORAL | 1 refills | Status: DC

## 2024-01-20 MED ORDER — AMLODIPINE BESYLATE 10 MG PO TABS: 10.0000 mg | ORAL_TABLET | Freq: Every day | ORAL | 1 refills | Status: DC

## 2024-01-20 MED ORDER — VITAMIN D (ERGOCALCIFEROL) 50000 UNITS PO CAPS: 1.0000 | ORAL_CAPSULE | ORAL | 1 refills | Status: DC

## 2024-01-20 MED ORDER — MONTELUKAST SODIUM 10 MG PO TABS: 10.0000 mg | ORAL_TABLET | Freq: Every day | ORAL | 1 refills | Status: DC

## 2024-01-20 NOTE — Progress Notes (Signed)
 Established Patient Office Visit  Subjective:  Patient ID: Kathleen [name], female    DOB: [birth year]  Age: 68 y.o. MRN: [id number]  CC:  Chief Complaint  Patient presents with   Medical Management of Chronic Issues    HPI Kathleen [name] presents for follow up of chronic medical problems -  Pt with hypertension and CHF - pt is currently taking norvasc 10mg , lasix 40mg , and zestril 40mg   Pt is overdue for follow up with cardiology and was referred at last visit -- she never made the appt but states they did call her and she will call them back to reschedule At this time she denies chest pain or dyspnea  Pt with  hypothyroidism - patient is currently taking synthroid qd despite that her dose was increased in January to - states the pharmacy filled the old dose Due to recheck tsh  Pt has chronic pain in her right knee after having knee replacement surgery years ago - states she uses tramadol prn for pain - requests refill of medication  Pt with GERD - is taking prilosec 40 qd which controls her symptoms - denies any GERD symptoms  Pt has  anxiety with mild depression - she states she is doing well on her current meds of buspar and cymbalta but has had some added stress with recent loss of her father and now taking care of her mother  Pt with hyperlipidemia and coronary artery disease - she is currently taking crestor 40mg  every day - overdue for follow up with cardiology  Pt with  Vit D deficiency -  - is taking weekly supplement Due for labwork  Pt with stress incontinence - takes ditropan XL 10mg   Pt complains of cough and allergies.  Cough is nonproductive.  Denies dyspnea - occasionally has some mild sinus drainage - does have trouble with seasonal allergies  Pt would like COVID booster and Pneumovax today  Past Medical History:  Diagnosis Date   Anxiety 12/28/2019   Arthritis    CAD in native artery 04/28/2016   Cancer (HCC)    Chronic back pain     Chronic pain of right knee 12/28/2019   Complication of anesthesia    Woke up during  Hysterectomy   Coronary artery disease 11/02/2010   Mild to Moderate   Dyslipidemia 04/28/2016   Encounter for follow-up surveillance of thyroid cancer 06/21/2018   Essential hypertension 04/28/2016   Family history of adverse reaction to anesthesia    GERD (gastroesophageal reflux disease)    omeprazole prn   GERD without esophagitis 12/28/2019   Headache with remote history of traumatic head injury 12/28/2019   History of blood transfusion    Hx of thyroid cancer    Hyperglycemia 06/19/2021   Hyperlipidemia    Hypertension    Hypothyroidism    Mixed hyperlipidemia    Morbid obesity (HCC) 12/28/2019   Murmur 08/03/2012   Nasal congestion 04/24/2021   Need for prophylactic vaccination and inoculation against influenza 09/24/2020   Other chest pain 04/24/2021   Other specified hypothyroidism 03/27/2020   Palpitations 08/03/2012   PONV (postoperative nausea and vomiting)    with thyroid surgery   Preop cardiovascular exam 04/28/2016   Primary hypertension 06/19/2021   Primary osteoarthritis of knee 04/28/2016   Pulmonary hypertension, unspecified (HCC)    Retrocalcaneal bone spur 06/01/2017   Sleep apnea, obstructive    possible   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    KNEE ARTHROPLASTY Right 05/01/2016   Procedure:  TOTAL KNEE ARTHROPLASTY;  Surgeon: Eldred Manges, MD;  Location: [facility] OR;  Service: Orthopedics;  Laterality: Right;   RIGHT/LEFT HEART CATH AND CORONARY ANGIOGRAPHY Bilateral 01/17/2018   Procedure:  RIGHT/LEFT HEART CATH AND CORONARY ANGIOGRAPHY;  Surgeon: Iran Ouch, MD;  Location: [facility] INVASIVE CV LAB;  Service: Cardiovascular;  Laterality: Bilateral;   THYROIDECTOMY  2000    Family History  Problem Relation Age of Onset   Diabetes Mother     Social History   Socioeconomic History   Marital status: Divorced    Spouse name: Not on file   Number of children: Not on file   Years of education: Not on file   Highest education level: Associate degree: academic program  Occupational History   Not on file  Tobacco Use   Smoking status: Former    Current packs/day: 0.00    Average packs/day: 0.5 packs/day for 20.0 years (10.0 ttl pk-yrs)    Types: Cigarettes    Start date: 31    Quit date: 2014    Years since quitting: 11.2   Smokeless tobacco: Never   Tobacco comments:    quit 2012  Vaping Use   Vaping status: Never Used  Substance and Sexual Activity   Alcohol use: Not Currently    Alcohol/week: 1.0 standard drink of alcohol    Types: 1 Glasses of wine per week   Drug use: No   Sexual activity: Not Currently  Other Topics Concern   Not on file  Social History Narrative   Not on file   Social Drivers of Health   Financial Resource Strain: Low Risk  (10/12/2023)   Overall Financial Resource Strain (CARDIA)    Difficulty of Paying Living Expenses: Not hard at all  Food Insecurity: Food Insecurity Present (10/12/2023)   Hunger Vital Sign    Worried About Running Out of Food in the Last Year: Sometimes true    Ran Out of Food in the Last Year: Never true  Transportation Needs: No Transportation Needs (10/12/2023)   PRAPARE - Administrator, Civil Service (Medical): No    Lack of Transportation (Non-Medical): No  Physical Activity: Inactive (10/12/2023)   Exercise Vital Sign    Days of Exercise per Week: 0 days    Minutes of Exercise per Session: 0 min  Stress: Stress Concern Present (10/12/2023)   [employer] of Occupational Health -  Occupational Stress Questionnaire    Feeling of Stress : To some extent  Social Connections: Moderately Isolated (10/12/2023)   Social Connection and Isolation Panel [NHANES]    Frequency of Communication with Friends and Family: More than three times a week    Frequency of Social Gatherings with Friends and Family: More than three times a week    Attends Religious Services: 1 to 4 times per year    Active Member of [employer] or Organizations: No    Attends Banker Meetings: Never    Marital Status: Divorced  Catering manager Violence: Not At Risk (10/12/2023)   Humiliation, Afraid, Rape, and Kick questionnaire  Fear of Current or Ex-Partner: No    Emotionally Abused: No    Physically Abused: No    Sexually Abused: No     Current Outpatient Medications:    aspirin EC 81 MG tablet, Take 81 mg by mouth daily. Swallow whole., Disp: , Rfl:    levothyroxine (SYNTHROID) 150 MCG tablet, Take 150 mcg by mouth daily before breakfast., Disp: , Rfl:    montelukast (SINGULAIR) 10 MG tablet, Take 1 tablet (10 mg total) by mouth at bedtime., Disp: 90 tablet, Rfl: 1   amLODipine (NORVASC) 10 MG tablet, Take 1 tablet (10 mg total) by mouth daily., Disp: 90 tablet, Rfl: 1   busPIRone (BUSPAR) 7.5 MG tablet, 1 po bid, Disp: 180 tablet, Rfl: 1   cyclobenzaprine (FLEXERIL) 5 MG tablet, Take 1 tablet (5 mg total) by mouth at bedtime., Disp: 90 tablet, Rfl: 1   DULoxetine (CYMBALTA) 60 MG capsule, Take 1 capsule (60 mg total) by mouth 2 (two) times daily., Disp: 180 capsule, Rfl: 1   furosemide (LASIX) 40 MG tablet, Take 1 tablet (40 mg total) by mouth daily., Disp: 90 tablet, Rfl: 1   lisinopril (ZESTRIL) 40 MG tablet, Take 1 tablet (40 mg total) by mouth daily., Disp: 90 tablet, Rfl: 1   omeprazole (PRILOSEC) 40 MG capsule, Take 1 capsule (40 mg total) by mouth 2 (two) times daily., Disp: 180 capsule, Rfl: 1   oxybutynin (DITROPAN-XL) 10 MG 24 hr tablet, Take 1 tablet (10 mg total) by mouth at  bedtime., Disp: 90 tablet, Rfl: 1   rosuvastatin (CRESTOR) 40 MG tablet, Take 1 tablet (40 mg total) by mouth daily., Disp: 30 tablet, Rfl: 1   traMADol (ULTRAM) 50 MG tablet, Take 1 tablet (50 mg total) by mouth daily as needed., Disp: 30 tablet, Rfl: 0   Vitamin D, Ergocalciferol, 50000 units CAPS, Take 1 each by mouth once a week., Disp: 15 capsule, Rfl: 1   Allergies  Allergen Reactions   Hydrocodone-Acetaminophen Other (See Comments) and Nausea And Vomiting    Headache, allergy to hydrocodone   Latex Hives   Loracarbef Other (See Comments)   Penicillins Other (See Comments)    Syncope Has patient had a PCN reaction causing immediate rash, facial/tongue/throat swelling, SOB or lightheadedness with hypotension: Unknown Has patient had a PCN reaction causing severe rash involving mucus membranes or skin necrosis: No Has patient had a PCN reaction that required hospitalization: No Has patient had a PCN reaction occurring within the last 10 years: Unknown If all of the above answers are "NO", then may proceed with Cephalosporin use.  Syncope Has patient had a PCN reaction causing immediate rash, facial/tongue/throat swelling, SOB or lightheadedness with hypotension: Unknown Has patient had a PCN reaction causing severe rash involving mucus membranes or skin necrosis: No Has patient had a PCN reaction that required hospitalization: No Has patient had a PCN reaction occurring within the last 10 years: Unknown If all of the above answers are "NO", then may proceed with Cephalosporin use.   Hydrocodone-Acetaminophen Other (See Comments)    Headache, allergy to hydrocodone   Prednisone Other (See Comments)    Crazy   CONSTITUTIONAL: Negative for chills, fatigue, fever, unintentional weight gain and unintentional weight loss.  E/N/T: Negative for ear pain, nasal congestion and sore throat.  CARDIOVASCULAR: Negative for chest pain, dizziness, palpitations and pedal edema.  RESPIRATORY: see  HPI GASTROINTESTINAL: Negative for abdominal pain, acid reflux symptoms, constipation, diarrhea, nausea and vomiting.  MSK: see HPI INTEGUMENTARY: Negative for  rash.  NEUROLOGICAL: Negative for dizziness and headaches.  PSYCHIATRIC: Negative for sleep disturbance and to question depression screen.  Negative for depression, negative for anhedonia.       Objective:  PHYSICAL EXAM:   VS: BP 130/80 (BP Location: Left Arm, Patient Position: Sitting, Cuff Size: Large)   Pulse 95   Temp (!) 97.2 F (36.2 C) (Temporal)   Ht 5\' 8"  (1.727 m)   Wt 285 lb 9.6 oz (129.5 kg)   SpO2 97%   BMI 43.43 kg/m   GEN: Well nourished, well developed, in no acute distress   Cardiac: RRR; no murmurs, rubs, or gallops,no edema -  Respiratory:  normal respiratory rate and pattern with no distress - normal breath sounds with no rales, rhonchi, wheezes or rubs  MS: no deformity or atrophy  Skin: warm and dry, no rash  Neuro:  Alert and Oriented x 3,- CN II-Xii grossly intact Psych: euthymic mood, appropriate affect and demeanor   There are no preventive care reminders to display for this patient.    There are no preventive care reminders to display for this patient.  Lab Results  Component Value Date   TSH 18.500 (H) 11/05/2023   Lab Results  Component Value Date   WBC 14.0 (H) 10/11/2023   HGB 15.0 10/11/2023   HCT 46.6 10/11/2023   MCV 92 10/11/2023   PLT 312 10/11/2023   Lab Results  Component Value Date   NA 138 10/11/2023   K 4.8 10/11/2023   CO2 22 10/11/2023   GLUCOSE 131 (H) 10/11/2023   BUN 14 10/11/2023   CREATININE 1.14 (H) 10/11/2023   BILITOT 0.4 10/11/2023   ALKPHOS 113 10/11/2023   AST 19 10/11/2023   ALT 18 10/11/2023   PROT 7.1 10/11/2023   ALBUMIN 4.2 10/11/2023   CALCIUM 10.1 10/11/2023   ANIONGAP 9 01/10/2018   EGFR 53 (L) 10/11/2023   Lab Results  Component Value Date   CHOL 141 10/11/2023   Lab Results  Component Value Date   HDL 42 10/11/2023    Lab Results  Component Value Date   LDLCALC 74 10/11/2023   Lab Results  Component Value Date   TRIG 140 10/11/2023   Lab Results  Component Value Date   CHOLHDL 3.4 10/11/2023   Lab Results  Component Value Date   HGBA1C 6.3 (H) 10/11/2023      Assessment & Plan:   Problem List Items Addressed This Visit       Cardiovascular and Mediastinum   Primary hypertension - Primary   Relevant Orders   Follow up with cardiology as directed - pt states she will call for appt Continue meds Labwork pending     Digestive   GERD (gastroesophageal reflux disease) Continue med     Endocrine   Other specified hypothyroidism   Relevant Medications   Continue current med TSH pending - will adjust med accordingly      Musculoskeletal and Integument   Primary osteoarthritis of knee   Relevant Medications   Continue med     Other   Mixed hyperlipidemia   Relevant Orders   Continue meds  Watch diet Lipid panel pending      Vitamin D insufficiency   Relevant Medications   Vitamin D, Ergocalciferol, (DRISDOL) 1.25 MG (50000 UNIT) CAPS capsule      Depression with anxiety Continue current medications      Need pneumonia vaccine Prevnar 20 given  Need COVID booster Pfizer booster given  Meds ordered this encounter  Medications   montelukast (SINGULAIR) 10 MG tablet    Sig: Take 1 tablet (10 mg total) by mouth at bedtime.    Dispense:  90 tablet    Refill:  1    Supervising Provider:   COX, KIRSTEN [[id number]]   amLODipine (NORVASC) 10 MG tablet    Sig: Take 1 tablet (10 mg total) by mouth daily.    Dispense:  90 tablet    Refill:  1    Supervising Provider:   COX, KIRSTEN [[id number]]   busPIRone (BUSPAR) 7.5 MG tablet    Sig: 1 po bid    Dispense:  180 tablet    Refill:  1    Supervising Provider:   COX, KIRSTEN [[id number]]   cyclobenzaprine (FLEXERIL) 5 MG tablet    Sig: Take 1 tablet (5 mg total) by mouth at bedtime.    Dispense:   90 tablet    Refill:  1    Supervising Provider:   COX, Fritzi Mandes [[id number]]   DULoxetine (CYMBALTA) 60 MG capsule    Sig: Take 1 capsule (60 mg total) by mouth 2 (two) times daily.    Dispense:  180 capsule    Refill:  1    Supervising Provider:   COX, Fritzi Mandes [[id number]]   furosemide (LASIX) 40 MG tablet    Sig: Take 1 tablet (40 mg total) by mouth daily.    Dispense:  90 tablet    Refill:  1    Supervising Provider:   COX, KIRSTEN [id number]   lisinopril (ZESTRIL) 40 MG tablet    Sig: Take 1 tablet (40 mg total) by mouth daily.    Dispense:  90 tablet    Refill:  1    Supervising Provider:   COX, Fritzi Mandes [[id number]]   omeprazole (PRILOSEC) 40 MG capsule    Sig: Take 1 capsule (40 mg total) by mouth 2 (two) times daily.    Dispense:  180 capsule    Refill:  1    Supervising Provider:   COX, KIRSTEN [id number]   oxybutynin (DITROPAN-XL) 10 MG 24 hr tablet    Sig: Take 1 tablet (10 mg total) by mouth at bedtime.    Dispense:  90 tablet    Refill:  1    Supervising Provider:   Blane Ohara [[id number]]   DISCONTD: rosuvastatin (CRESTOR) 40 MG tablet    Sig: Take 1 tablet (40 mg total) by mouth daily.    Dispense:  90 tablet    Refill:  1    Supervising Provider:   COX, Aniceto Boss   Vitamin D, Ergocalciferol, 50000 units CAPS    Sig: Take 1 each by mouth once a week.    Dispense:  15 capsule    Refill:  1    Supervising Provider:   COX, Fritzi Mandes [[id number]]   rosuvastatin (CRESTOR) 40 MG tablet    Sig: Take 1 tablet (40 mg total) by mouth daily.    Dispense:  30 tablet    Refill:  1    Supervising Provider:   COX, KIRSTEN [[id number]]   traMADol (ULTRAM) 50 MG tablet    Sig: Take 1 tablet (50 mg total) by mouth daily as needed.    Dispense:  30 tablet    Refill:  0    Supervising Provider:   Blane Ohara [id number]     Follow-up: Return in about 4 months (around 05/21/2024) for chronic fasting follow-up.    SARA R Eveline Sauve, PA-Ch

## 2024-01-21 LAB — CBC WITH DIFFERENTIAL/PLATELET
Basophils Absolute: 0.1 10*3/uL (ref 0.0–0.2)
Basos: 1 %
EOS (ABSOLUTE): 0.3 10*3/uL (ref 0.0–0.4)
Eos: 4 %
Hematocrit: 41.4 % (ref 34.0–46.6)
Hemoglobin: 13.2 g/dL (ref 11.1–15.9)
Immature Grans (Abs): 0 10*3/uL (ref 0.0–0.1)
Immature Granulocytes: 1 %
Lymphocytes Absolute: 1.7 10*3/uL (ref 0.7–3.1)
Lymphs: 19 %
MCH: 28.7 pg (ref 26.6–33.0)
MCHC: 31.9 g/dL (ref 31.5–35.7)
MCV: 90 fL (ref 79–97)
Monocytes Absolute: 0.4 10*3/uL (ref 0.1–0.9)
Monocytes: 4 %
Neutrophils Absolute: 6.4 10*3/uL (ref 1.4–7.0)
Neutrophils: 71 %
Platelets: 307 10*3/uL (ref 150–450)
RBC: 4.6 x10E6/uL (ref 3.77–5.28)
RDW: 13.8 % (ref 11.7–15.4)
WBC: 8.9 10*3/uL (ref 3.4–10.8)

## 2024-01-21 LAB — COMPREHENSIVE METABOLIC PANEL
ALT: 14 IU/L (ref 0–32)
AST: 17 IU/L (ref 0–40)
Albumin: 4 g/dL (ref 3.9–4.9)
Alkaline Phosphatase: 100 IU/L (ref 44–121)
BUN/Creatinine Ratio: 16 (ref 12–28)
BUN: 16 mg/dL (ref 8–27)
Bilirubin Total: 0.3 mg/dL (ref 0.0–1.2)
CO2: 21 mmol/L (ref 20–29)
Calcium: 10.1 mg/dL (ref 8.7–10.3)
Chloride: 104 mmol/L (ref 96–106)
Creatinine, Ser: 0.99 mg/dL (ref 0.57–1.00)
Globulin, Total: 2.3 g/dL (ref 1.5–4.5)
Glucose: 127 mg/dL — ABNORMAL HIGH (ref 70–99)
Potassium: 4.5 mmol/L (ref 3.5–5.2)
Sodium: 139 mmol/L (ref 134–144)
Total Protein: 6.3 g/dL (ref 6.0–8.5)
eGFR: 62 mL/min/{1.73_m2} (ref >59)

## 2024-01-21 LAB — HEMOGLOBIN A1C
Est. average glucose Bld gHb Est-mCnc: 140 mg/dL
Hgb A1c MFr Bld: 6.5 % — ABNORMAL HIGH (ref 4.8–5.6)

## 2024-01-21 LAB — LIPID PANEL
Chol/HDL Ratio: 3.3 ratio (ref 0.0–4.4)
Cholesterol, Total: 125 mg/dL (ref 100–199)
HDL: 38 mg/dL — ABNORMAL LOW (ref >39)
LDL Chol Calc (NIH): 68 mg/dL (ref 0–99)
Triglycerides: 100 mg/dL (ref 0–149)
VLDL Cholesterol Cal: 19 mg/dL (ref 5–40)

## 2024-01-21 LAB — VITAMIN D 25 HYDROXY (VIT D DEFICIENCY, FRACTURES): Vit D, 25-Hydroxy: 35 ng/mL (ref 30.0–100.0)

## 2024-01-21 LAB — TSH: TSH: 11.5 u[IU]/mL — ABNORMAL HIGH (ref 0.450–4.500)

## 2024-01-24 ENCOUNTER — Encounter: Payer: Self-pay | Admitting: Physician Assistant

## 2024-01-24 ENCOUNTER — Other Ambulatory Visit: Payer: Self-pay | Admitting: Physician Assistant

## 2024-01-24 DIAGNOSIS — E038 Other specified hypothyroidism: Secondary | ICD-10-CM

## 2024-01-24 MED ORDER — LEVOTHYROXINE SODIUM 175 MCG PO TABS: 175.0000 ug | ORAL_TABLET | Freq: Every day | ORAL | 0 refills | Status: DC

## 2024-01-25 ENCOUNTER — Ambulatory Visit
Admission: RE | Admit: 2024-01-25 | Discharge: 2024-01-25 | Disposition: A | Discharge status: Home / Self Care | Payer: Medicare Other | Source: Ambulatory Visit

## 2024-01-25 DIAGNOSIS — Z1231 Encounter for screening mammogram for malignant neoplasm of breast: Secondary | ICD-10-CM

## 2024-01-27 ENCOUNTER — Other Ambulatory Visit: Payer: Self-pay | Admitting: Physician Assistant

## 2024-01-27 DIAGNOSIS — R928 Other abnormal and inconclusive findings on diagnostic imaging of breast: Secondary | ICD-10-CM

## 2024-02-11 ENCOUNTER — Ambulatory Visit

## 2024-02-11 ENCOUNTER — Ambulatory Visit
Admission: RE | Admit: 2024-02-11 | Discharge: 2024-02-11 | Disposition: A | Discharge status: Home / Self Care | Source: Ambulatory Visit | Attending: Physician Assistant | Admitting: Physician Assistant

## 2024-02-11 DIAGNOSIS — R928 Other abnormal and inconclusive findings on diagnostic imaging of breast: Secondary | ICD-10-CM

## 2024-03-20 ENCOUNTER — Telehealth: Payer: Self-pay

## 2024-03-20 ENCOUNTER — Other Ambulatory Visit: Payer: Self-pay | Admitting: Physician Assistant

## 2024-03-20 MED ORDER — LEVOTHYROXINE SODIUM 175 MCG PO TABS: 175.0000 ug | ORAL_TABLET | Freq: Every day | ORAL | 0 refills | Status: DC

## 2024-03-20 NOTE — Telephone Encounter (Signed)
 Please see prescription request below.     Copied from CRM [id number]. Topic: Clinical - Prescription Issue >> Mar 20, 2024 11:32 AM Kathleen [name] wrote: Reason for CRM:  Medication prescription was filled for Levothyroxine , per patient needs it to be (SYNTHROID ) 175 MCG tablet.   Per patient only takes Synthroid , unable to take Levothyroxine .   Preferred pharmacy:  [facility] - [place], [place] - [address] [place] [address] Phone: [phone] Fax: [phone] Hours: Not open 24 hours  Please call patient with any questions at [phone]

## 2024-03-29 ENCOUNTER — Other Ambulatory Visit: Payer: Self-pay | Admitting: Physician Assistant

## 2024-03-29 DIAGNOSIS — N393 Stress incontinence (female) (male): Secondary | ICD-10-CM

## 2024-03-29 DIAGNOSIS — J3089 Other allergic rhinitis: Secondary | ICD-10-CM

## 2024-03-29 DIAGNOSIS — I10 Essential (primary) hypertension: Secondary | ICD-10-CM

## 2024-04-04 DIAGNOSIS — M1712 Unilateral primary osteoarthritis, left knee: Secondary | ICD-10-CM | POA: Diagnosis not present

## 2024-04-23 ENCOUNTER — Other Ambulatory Visit: Payer: Self-pay | Admitting: Physician Assistant

## 2024-04-23 DIAGNOSIS — E782 Mixed hyperlipidemia: Secondary | ICD-10-CM

## 2024-04-23 DIAGNOSIS — I10 Essential (primary) hypertension: Secondary | ICD-10-CM

## 2024-04-23 DIAGNOSIS — K219 Gastro-esophageal reflux disease without esophagitis: Secondary | ICD-10-CM

## 2024-05-08 ENCOUNTER — Other Ambulatory Visit: Payer: Self-pay | Admitting: Family Medicine

## 2024-05-08 ENCOUNTER — Encounter: Payer: Self-pay | Admitting: Physician Assistant

## 2024-05-08 ENCOUNTER — Ambulatory Visit (INDEPENDENT_AMBULATORY_CARE_PROVIDER_SITE_OTHER): Admitting: Physician Assistant

## 2024-05-08 VITALS — BP 126/84 | HR 88 | Temp 97.4°F | Ht 68.0 in | Wt 286.2 lb

## 2024-05-08 DIAGNOSIS — E038 Other specified hypothyroidism: Secondary | ICD-10-CM | POA: Diagnosis not present

## 2024-05-08 DIAGNOSIS — F50812 Binge eating disorder, severe: Secondary | ICD-10-CM | POA: Diagnosis not present

## 2024-05-08 MED ORDER — LISDEXAMFETAMINE DIMESYLATE 30 MG PO CAPS: 30.0000 mg | ORAL_CAPSULE | Freq: Every day | ORAL | 0 refills | Status: DC

## 2024-05-08 NOTE — Progress Notes (Signed)
 Subjective:  Patient ID: Kathleen [name], female    DOB: 17-Jan-1956  Age: 68 y.o. MRN: [id number]  Chief Complaint  Patient presents with   Help with weight loss    HPI Pt with hypothyroidism - currently on synthroid  175mcg qd - doing well on that dose - is due for recheck of TSH  Pt would like to try medication to help with weight loss - she had tried phentermine  in the past but it gave her a tic and would like to try something different.  She has trouble with overeating and stress eating - eats more at night and is triggered by her depression and anxiety - does binge eat      05/08/2024    2:30 PM 01/20/2024    8:27 AM 10/12/2023    9:08 AM 09/20/2023   11:32 AM 05/03/2023    8:29 AM  Depression screen PHQ 2/9  Decreased Interest 2 1 2 2 2   Down, Depressed, Hopeless 1 1 1 1 1   PHQ - 2 Score 3 2 3 3 3   Altered sleeping 2 1 1 1 1   Tired, decreased energy 3 1 2 2 1   Change in appetite 2 0 3 3 2   Feeling bad or failure about yourself  3 0 0 1 1  Trouble concentrating 1 1 0 0 1  Moving slowly or fidgety/restless 0 0 0 0 0  Suicidal thoughts 0 0 0 0 0  PHQ-9 Score 14 5 9 10 9   Difficult doing work/chores Somewhat difficult Not difficult at all Not difficult at all Not difficult at all Not difficult at all        05/03/2023    8:29 AM 09/20/2023   11:32 AM 10/12/2023    8:57 AM 01/20/2024    8:27 AM 05/08/2024    2:29 PM  Fall Risk  Falls in the past year? 0 1 1 0 1  Was there an injury with Fall? 0 0 0 0 1  Fall Risk Category Calculator 0 1 2 0 3  Patient at Risk for Falls Due to No Fall Risks No Fall Risks  No Fall Risks History of fall(s)  Fall risk Follow up Follow up appointment Falls evaluation completed  Falls evaluation completed      ROS CONSTITUTIONAL: Negative for chills, fatigue, fever E/N/T: Negative for ear pain, nasal congestion and sore throat.  CARDIOVASCULAR: Negative for chest pain, dizziness, palpitations and pedal edema.  RESPIRATORY: Negative for recent  cough and dyspnea.  PSYCHIATRIC: Negative for sleep disturbance and to question depression screen.  Negative for depression, negative for anhedonia.    Current Outpatient Medications:    amLODipine  (NORVASC ) 10 MG tablet, TAKE 1 TABLET BY MOUTH DAILY, Disp: 100 tablet, Rfl: 0   aspirin  EC 81 MG tablet, Take 81 mg by mouth daily. Swallow whole., Disp: , Rfl:    busPIRone  (BUSPAR ) 7.5 MG tablet, 1 po bid, Disp: 180 tablet, Rfl: 1   cyclobenzaprine  (FLEXERIL ) 5 MG tablet, Take 1 tablet (5 mg total) by mouth at bedtime., Disp: 90 tablet, Rfl: 1   DULoxetine  (CYMBALTA ) 60 MG capsule, Take 1 capsule (60 mg total) by mouth 2 (two) times daily., Disp: 180 capsule, Rfl: 1   furosemide  (LASIX ) 40 MG tablet, TAKE 1 TABLET BY MOUTH DAILY, Disp: 100 tablet, Rfl: 0   lisinopril  (ZESTRIL ) 40 MG tablet, TAKE 1 TABLET BY MOUTH DAILY, Disp: 100 tablet, Rfl: 2   montelukast  (SINGULAIR ) 10 MG tablet, TAKE 1 TABLET BY MOUTH  AT  BEDTIME, Disp: 100 tablet, Rfl: 0   Multiple Vitamins-Minerals (ADULT GUMMY PO), Take 1 each by mouth daily. Delta 9 THC gummy, Disp: , Rfl:    omeprazole  (PRILOSEC) 40 MG capsule, TAKE 1 CAPSULE BY MOUTH TWICE  DAILY, Disp: 200 capsule, Rfl: 2   oxybutynin  (DITROPAN -XL) 10 MG 24 hr tablet, TAKE 1 TABLET BY MOUTH AT  BEDTIME, Disp: 100 tablet, Rfl: 0   rosuvastatin  (CRESTOR ) 40 MG tablet, TAKE 1 TABLET BY MOUTH DAILY, Disp: 100 tablet, Rfl: 2   SYNTHROID  175 MCG tablet, TAKE 1 TABLET BY MOUTH DAILY, Disp: 90 tablet, Rfl: 3   Vitamin D , Ergocalciferol , 50000 units CAPS, Take 1 each by mouth once a week., Disp: 15 capsule, Rfl: 1   lisdexamfetamine (VYVANSE ) 30 MG capsule, Take 1 capsule (30 mg total) by mouth daily., Disp: 30 capsule, Rfl: 0   traMADol  (ULTRAM ) 50 MG tablet, Take 1 tablet (50 mg total) by mouth daily as needed. (Patient not taking: Reported on 05/08/2024), Disp: 30 tablet, Rfl: 0  Past Medical History:  Diagnosis Date   Anxiety 12/28/2019   Arthritis    CAD in native artery  04/28/2016   Cancer (HCC)    Chronic back pain    Chronic pain of right knee 12/28/2019   Complication of anesthesia    Woke up during  Hysterectomy   Coronary artery disease 11/02/2010   Mild to Moderate   Dyslipidemia 04/28/2016   Encounter for follow-up surveillance of thyroid  cancer 06/21/2018   Essential hypertension 04/28/2016   Family history of adverse reaction to anesthesia    GERD (gastroesophageal reflux disease)    omeprazole  prn   GERD without esophagitis 12/28/2019   Headache with remote history of traumatic head injury 12/28/2019   History of blood transfusion    Hx of thyroid  cancer    Hyperglycemia 06/19/2021   Hyperlipidemia    Hypertension    Hypothyroidism    Mixed hyperlipidemia    Morbid obesity (HCC) 12/28/2019   Murmur 08/03/2012   Nasal congestion 04/24/2021   Need for prophylactic vaccination and inoculation against influenza 09/24/2020   Other chest pain 04/24/2021   Other specified hypothyroidism 03/27/2020   Palpitations 08/03/2012   PONV (postoperative nausea and vomiting)    with thyroid  surgery   Preop cardiovascular exam 04/28/2016   Primary hypertension 06/19/2021   Primary osteoarthritis of knee 04/28/2016   Pulmonary hypertension, unspecified (HCC)    Retrocalcaneal bone spur 06/01/2017   Sleep apnea, obstructive    possible   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/01/2016   Tendonitis, Achilles, left 06/01/2017   Unstable angina (HCC) 01/11/2018   Added automatically from request for surgery [id number]   Visit for screening mammogram 04/15/2015   Vitamin D  insufficiency 03/27/2020   Objective:  PHYSICAL EXAM:   BP 126/84   Pulse 88   Temp (!) 97.4 F (36.3 C)   Ht 5' 8 (1.727 m)   Wt 286 lb 3.2 oz (129.8 kg)   SpO2 97%   BMI 43.52 kg/m    GEN: Well nourished, well developed, in no acute distress  Cardiac: RRR; no murmurs, rubs, or gallops,no edema - Respiratory:  normal respiratory rate and pattern with no distress - normal breath sounds  with no rales, rhonchi, wheezes or rubs  Skin: warm and dry, no rash  Neuro:  Alert and Oriented x 3,  - CN II-Xii grossly intact Psych: euthymic mood, appropriate affect and demeanor  Assessment & Plan:    Other  specified hypothyroidism -     TSH  Severe binge-eating disorder Rx vyvanse  30mg  qd Decrease carbs in diet - increase water - start exercising    Follow-up: Return in about 4 weeks (around 06/05/2024) for chronic fasting follow-up.  An After Visit Summary was printed and given to the patient.  CAMIE JONELLE NICHOLAUS DEVONNA [facility] [phone]

## 2024-05-09 ENCOUNTER — Ambulatory Visit: Payer: Self-pay | Admitting: Physician Assistant

## 2024-05-09 ENCOUNTER — Other Ambulatory Visit: Payer: Self-pay | Admitting: Physician Assistant

## 2024-05-09 DIAGNOSIS — E038 Other specified hypothyroidism: Secondary | ICD-10-CM

## 2024-05-09 LAB — TSH: TSH: 22.4 u[IU]/mL — ABNORMAL HIGH (ref 0.450–4.500)

## 2024-05-09 MED ORDER — SYNTHROID 175 MCG PO TABS: 175.0000 ug | ORAL_TABLET | Freq: Every day | ORAL | 1 refills | Status: AC

## 2024-05-23 ENCOUNTER — Ambulatory Visit: Admitting: Physician Assistant

## 2024-05-26 ENCOUNTER — Telehealth: Payer: Self-pay

## 2024-05-26 NOTE — Telephone Encounter (Signed)
 Spoke to [place], stated that patient insurance ordered the test and she just wanted to notify PCP and she has sent results through fax.   Copied from CRM #[id number]. Topic: General - Other >> May 25, 2024  4:17 PM Deleta S wrote: Reason for CRM: Rosina from [facility] calls is calling due to patient having a circulation quantflo test done on today and her left foot is slightly decrease. You can reach ashley at [phone] if needed for updates

## 2024-05-28 ENCOUNTER — Other Ambulatory Visit: Payer: Self-pay | Admitting: Family Medicine

## 2024-05-28 ENCOUNTER — Other Ambulatory Visit: Payer: Self-pay | Admitting: Physician Assistant

## 2024-05-28 DIAGNOSIS — J3089 Other allergic rhinitis: Secondary | ICD-10-CM

## 2024-05-28 DIAGNOSIS — M542 Cervicalgia: Secondary | ICD-10-CM

## 2024-05-28 DIAGNOSIS — E559 Vitamin D deficiency, unspecified: Secondary | ICD-10-CM

## 2024-05-28 DIAGNOSIS — I10 Essential (primary) hypertension: Secondary | ICD-10-CM

## 2024-05-28 DIAGNOSIS — N393 Stress incontinence (female) (male): Secondary | ICD-10-CM

## 2024-05-28 DIAGNOSIS — F418 Other specified anxiety disorders: Secondary | ICD-10-CM

## 2024-06-16 ENCOUNTER — Ambulatory Visit: Admitting: Physician Assistant

## 2024-07-03 ENCOUNTER — Other Ambulatory Visit: Payer: Self-pay | Admitting: Physician Assistant

## 2024-07-03 DIAGNOSIS — F418 Other specified anxiety disorders: Secondary | ICD-10-CM

## 2024-07-03 DIAGNOSIS — M542 Cervicalgia: Secondary | ICD-10-CM

## 2024-08-03 ENCOUNTER — Ambulatory Visit: Payer: Self-pay

## 2024-08-03 NOTE — Telephone Encounter (Signed)
 FYI Only or Action Required?: Action required by provider: Medication for symptoms.  Patient was last seen in primary care on 05/08/2024 by Nicholaus Credit, PA-C.  Called Nurse Triage reporting Rash.  Symptoms began several days ago.  Interventions attempted: OTC medications: Tylenol .  Symptoms are: gradually worsening.  Triage Disposition: See PCP When Office is Open (Within 3 Days)  Patient/caregiver understands and will follow disposition?: No, wishes to speak with PCP      Copied from CRM #[id number]. Topic: Clinical - Red Word Triage >> Aug 03, 2024  4:11 PM Amy B wrote: Red Word that prompted transfer to Nurse Triage: shingles Reason for Disposition  [1] Pimples (localized) AND Domitilla.Distel ] no improvement after using Care Advice  Answer Assessment - Initial Assessment Questions Patient states she has a hx of shingles. Currently using Tylenol  for pain.  Patient requesting medication for symptoms and refused appointment in office and only wants to see her PCP. Preferred pharmacy below    [facility] - [place], [place] - [address]  [address], [place] [address]     1. APPEARANCE of RASH: What does the rash look like? (e.g., blisters, dry flaky skin, red spots, redness, sores)     Blisters  2. LOCATION: Where is the rash located?      Underneath breast area  3. NUMBER: How many spots are there?      1 spot in the area  4. ONSET: When did the rash start?      A few days ago  5. ITCHING: Does the rash itch? If Yes, ask: How bad is the itch?  (Scale 0-10; or none, mild, moderate, severe)     Mild  6. PAIN: Does the rash hurt? If Yes, ask: How bad is the pain?  (Scale 0-10; or none, mild, moderate, severe)     8/10  7. OTHER SYMPTOMS: Do you have any other symptoms? (e.g., fever)     Denies  Protocols used: Rash or Redness - Localized-A-AH

## 2024-08-03 NOTE — Telephone Encounter (Signed)
 Pt to schedule appt - if it is a rash and thinks is shingles could schedule video visit

## 2024-08-03 NOTE — Telephone Encounter (Signed)
 My chart message sent

## 2024-08-07 ENCOUNTER — Encounter: Payer: Self-pay | Admitting: Physician Assistant

## 2024-08-09 ENCOUNTER — Ambulatory Visit (INDEPENDENT_AMBULATORY_CARE_PROVIDER_SITE_OTHER): Admitting: Physician Assistant

## 2024-08-09 VITALS — BP 138/88 | HR 99 | Temp 98.2°F | Resp 18 | Ht 68.0 in | Wt 284.6 lb

## 2024-08-09 DIAGNOSIS — L0100 Impetigo, unspecified: Secondary | ICD-10-CM

## 2024-08-09 DIAGNOSIS — L304 Erythema intertrigo: Secondary | ICD-10-CM | POA: Diagnosis not present

## 2024-08-09 MED ORDER — TERBINAFINE HCL 250 MG PO TABS
250.0000 mg | ORAL_TABLET | Freq: Every day | ORAL | 1 refills | Status: DC
Start: 2024-08-09 — End: 2024-09-27

## 2024-08-09 MED ORDER — DOXYCYCLINE HYCLATE 100 MG PO TABS: 100.0000 mg | ORAL_TABLET | Freq: Two times a day (BID) | ORAL | 0 refills | Status: DC

## 2024-08-09 MED ORDER — CLOTRIMAZOLE-BETAMETHASONE 1-0.05 % EX CREA: 1.0000 | TOPICAL_CREAM | Freq: Every day | CUTANEOUS | 0 refills | Status: DC

## 2024-08-09 NOTE — Progress Notes (Signed)
 Acute Office Visit  Subjective:    Patient ID: Kathleen [name], female    DOB: [birth year], 68 y.o.   MRN: [id number]  Chief Complaint  Patient presents with   Rash    Under right breast    HPI: Patient is in today for complaints of severe itchy rash under right breast - has been there about a week.  Is bright red and inflamed with some mild crusting lesions Denies fever/malaise   Current Outpatient Medications:    amLODipine  (NORVASC ) 10 MG tablet, TAKE 1 TABLET BY MOUTH DAILY, Disp: 100 tablet, Rfl: 2   aspirin  EC 81 MG tablet, Take 81 mg by mouth daily. Swallow whole., Disp: , Rfl:    busPIRone  (BUSPAR ) 7.5 MG tablet, TAKE 1 TABLET BY MOUTH TWICE  DAILY, Disp: 180 tablet, Rfl: 0   clotrimazole-betamethasone (LOTRISONE) cream, Apply 1 Application topically daily., Disp: 30 g, Rfl: 0   cyclobenzaprine  (FLEXERIL ) 5 MG tablet, TAKE 1 TABLET BY MOUTH AT  BEDTIME, Disp: 90 tablet, Rfl: 0   doxycycline (VIBRA-TABS) 100 MG tablet, Take 1 tablet (100 mg total) by mouth 2 (two) times daily., Disp: 20 tablet, Rfl: 0   DULoxetine  (CYMBALTA ) 60 MG capsule, TAKE 1 CAPSULE BY MOUTH TWICE  DAILY, Disp: 180 capsule, Rfl: 0   furosemide  (LASIX ) 40 MG tablet, TAKE 1 TABLET BY MOUTH DAILY, Disp: 100 tablet, Rfl: 2   lisdexamfetamine (VYVANSE ) 30 MG capsule, Take 1 capsule (30 mg total) by mouth daily., Disp: 30 capsule, Rfl: 0   lisinopril  (ZESTRIL ) 40 MG tablet, TAKE 1 TABLET BY MOUTH DAILY, Disp: 100 tablet, Rfl: 2   montelukast  (SINGULAIR ) 10 MG tablet, TAKE 1 TABLET BY MOUTH AT  BEDTIME, Disp: 100 tablet, Rfl: 2   Multiple Vitamins-Minerals (ADULT GUMMY PO), Take 1 each by mouth daily. Delta 9 THC gummy, Disp: , Rfl:    omeprazole  (PRILOSEC) 40 MG capsule, TAKE 1 CAPSULE BY MOUTH TWICE  DAILY, Disp: 200 capsule, Rfl: 2   oxybutynin  (DITROPAN -XL) 10 MG 24 hr tablet, TAKE 1 TABLET BY MOUTH AT  BEDTIME, Disp: 100 tablet, Rfl: 2   rosuvastatin  (CRESTOR ) 40 MG tablet, TAKE 1 TABLET BY MOUTH DAILY,  Disp: 100 tablet, Rfl: 2   SYNTHROID  175 MCG tablet, Take 1 tablet (175 mcg total) by mouth daily. NAME BRAND ONLY, Disp: 30 tablet, Rfl: 1   terbinafine (LAMISIL) 250 MG tablet, Take 1 tablet (250 mg total) by mouth daily., Disp: 15 tablet, Rfl: 1   traMADol  (ULTRAM ) 50 MG tablet, Take 1 tablet (50 mg total) by mouth daily as needed., Disp: 30 tablet, Rfl: 0   trimethoprim-polymyxin b (POLYTRIM) ophthalmic solution, 1 drop 3 (three) times daily., Disp: , Rfl:    Vitamin D , Ergocalciferol , (DRISDOL ) 1.25 MG (50000 UNIT) CAPS capsule, TAKE 1 CAPSULE BY MOUTH ONCE  WEEKLY, Disp: 15 capsule, Rfl: 0  Allergies  Allergen Reactions   Hydrocodone-Acetaminophen  Other (See Comments) and Nausea And Vomiting    Headache, allergy to hydrocodone   Latex Hives   Loracarbef Other (See Comments)   Penicillins Other (See Comments)    Syncope Has patient had a PCN reaction causing immediate rash, facial/tongue/throat swelling, SOB or lightheadedness with hypotension: Unknown Has patient had a PCN reaction causing severe rash involving mucus membranes or skin necrosis: No Has patient had a PCN reaction that required hospitalization: No Has patient had a PCN reaction occurring within the last 10 years: Unknown If all of the above answers are NO, then may proceed with Cephalosporin  use.  Syncope Has patient had a PCN reaction causing immediate rash, facial/tongue/throat swelling, SOB or lightheadedness with hypotension: Unknown Has patient had a PCN reaction causing severe rash involving mucus membranes or skin necrosis: No Has patient had a PCN reaction that required hospitalization: No Has patient had a PCN reaction occurring within the last 10 years: Unknown If all of the above answers are NO, then may proceed with Cephalosporin use.   Hydrocodone-Acetaminophen  Other (See Comments)    Headache, allergy to hydrocodone   Prednisone Other (See Comments)    Crazy    ROS CONSTITUTIONAL: Negative for  chills, fatigue, fever,   CARDIOVASCULAR: Negative for chest pain, .  RESPIRATORY: Negative for recent cough and dyspnea.   INTEGUMENTARY: see HPI      Objective:    PHYSICAL EXAM:   BP 138/88   Pulse 99   Temp 98.2 F (36.8 C) (Temporal)   Resp 18   Ht 5' 8 (1.727 m)   Wt 284 lb 9.6 oz (129.1 kg)   SpO2 97%   BMI 43.27 kg/m    GEN: Well nourished, well developed, in no acute distress  Cardiac: RRR; no murmurs,  Respiratory:  normal respiratory rate and pattern with no distress - normal breath sounds with no rales, rhonchi, wheezes or rubs  Skin: bright red confluent rash under left breast with some crusted scattered lesions     Assessment & Plan:    Intertrigo -     Clotrimazole-Betamethasone; Apply 1 Application topically daily.  Dispense: 30 g; Refill: 0 -     Terbinafine HCl; Take 1 tablet (250 mg total) by mouth daily.  Dispense: 15 tablet; Refill: 1  Impetigo -     Doxycycline Hyclate; Take 1 tablet (100 mg total) by mouth 2 (two) times daily.  Dispense: 20 tablet; Refill: 0     Follow-up: Return for needs to schedule chronic fasting follow up - 40 min.  An After Visit Summary was printed and given to the patient.  Kathleen [name] [facility] [phone]

## 2024-08-21 ENCOUNTER — Other Ambulatory Visit: Payer: Self-pay | Admitting: Physician Assistant

## 2024-08-21 DIAGNOSIS — M542 Cervicalgia: Secondary | ICD-10-CM

## 2024-08-21 DIAGNOSIS — F418 Other specified anxiety disorders: Secondary | ICD-10-CM

## 2024-08-21 DIAGNOSIS — E559 Vitamin D deficiency, unspecified: Secondary | ICD-10-CM

## 2024-09-26 ENCOUNTER — Telehealth: Payer: Self-pay

## 2024-09-26 ENCOUNTER — Other Ambulatory Visit: Payer: Self-pay | Admitting: Physician Assistant

## 2024-09-26 DIAGNOSIS — E559 Vitamin D deficiency, unspecified: Secondary | ICD-10-CM

## 2024-09-26 DIAGNOSIS — E782 Mixed hyperlipidemia: Secondary | ICD-10-CM

## 2024-09-26 DIAGNOSIS — E038 Other specified hypothyroidism: Secondary | ICD-10-CM

## 2024-09-26 DIAGNOSIS — R7303 Prediabetes: Secondary | ICD-10-CM

## 2024-09-26 DIAGNOSIS — I10 Essential (primary) hypertension: Secondary | ICD-10-CM

## 2024-09-26 NOTE — Telephone Encounter (Signed)
 Patient stated she needs refill on fungus medication that you gave her both oral medication and cream

## 2024-09-26 NOTE — Telephone Encounter (Signed)
 Have pt set up lab appt - orders in

## 2024-09-26 NOTE — Telephone Encounter (Signed)
 Patient called and will come in Monday for labs.

## 2024-09-26 NOTE — Telephone Encounter (Signed)
 Copied from CRM [id number]. Topic: Clinical - Request for Lab/Test Order >> Sep 26, 2024  9:09 AM Donna BRAVO wrote: Reason for CRM: patient has appt for 10/04/24 and would like to have labs ordered and drawn before the appt

## 2024-09-26 NOTE — Telephone Encounter (Signed)
 Which medications exactly and for what reason? (She has a few different ones in chart)

## 2024-09-27 ENCOUNTER — Other Ambulatory Visit: Payer: Self-pay | Admitting: Physician Assistant

## 2024-09-27 DIAGNOSIS — L304 Erythema intertrigo: Secondary | ICD-10-CM

## 2024-09-27 MED ORDER — TERBINAFINE HCL 250 MG PO TABS: 250.0000 mg | ORAL_TABLET | Freq: Every day | ORAL | 0 refills | Status: DC

## 2024-09-27 MED ORDER — CLOTRIMAZOLE-BETAMETHASONE 1-0.05 % EX CREA: 1.0000 | TOPICAL_CREAM | Freq: Every day | CUTANEOUS | 0 refills | Status: AC

## 2024-09-27 NOTE — Telephone Encounter (Signed)
 Patient states the medication that was given was for under the breast, stated it was given to her at her last visit

## 2024-09-27 NOTE — Telephone Encounter (Signed)
 Meds sent

## 2024-10-02 ENCOUNTER — Other Ambulatory Visit

## 2024-10-02 DIAGNOSIS — E559 Vitamin D deficiency, unspecified: Secondary | ICD-10-CM

## 2024-10-02 DIAGNOSIS — E038 Other specified hypothyroidism: Secondary | ICD-10-CM

## 2024-10-02 DIAGNOSIS — E782 Mixed hyperlipidemia: Secondary | ICD-10-CM

## 2024-10-02 DIAGNOSIS — R7303 Prediabetes: Secondary | ICD-10-CM

## 2024-10-02 DIAGNOSIS — I10 Essential (primary) hypertension: Secondary | ICD-10-CM

## 2024-10-03 ENCOUNTER — Ambulatory Visit: Payer: Self-pay | Admitting: Physician Assistant

## 2024-10-03 ENCOUNTER — Other Ambulatory Visit: Payer: Self-pay | Admitting: Physician Assistant

## 2024-10-03 DIAGNOSIS — E038 Other specified hypothyroidism: Secondary | ICD-10-CM

## 2024-10-03 LAB — COMPREHENSIVE METABOLIC PANEL WITH GFR
ALT: 12 IU/L (ref 0–32)
AST: 14 IU/L (ref 0–40)
Albumin: 4.1 g/dL (ref 3.9–4.9)
Alkaline Phosphatase: 110 IU/L (ref 49–135)
BUN/Creatinine Ratio: 16 (ref 12–28)
BUN: 17 mg/dL (ref 8–27)
Bilirubin Total: 0.4 mg/dL (ref 0.0–1.2)
CO2: 21 mmol/L (ref 20–29)
Calcium: 10.6 mg/dL — ABNORMAL HIGH (ref 8.7–10.3)
Chloride: 102 mmol/L (ref 96–106)
Creatinine, Ser: 1.06 mg/dL — ABNORMAL HIGH (ref 0.57–1.00)
Globulin, Total: 2.5 g/dL (ref 1.5–4.5)
Glucose: 109 mg/dL — ABNORMAL HIGH (ref 70–99)
Potassium: 4.2 mmol/L (ref 3.5–5.2)
Sodium: 139 mmol/L (ref 134–144)
Total Protein: 6.6 g/dL (ref 6.0–8.5)
eGFR: 57 mL/min/1.73 — ABNORMAL LOW (ref >59)

## 2024-10-03 LAB — CBC WITH DIFFERENTIAL/PLATELET
Basophils Absolute: 0 x10E3/uL (ref 0.0–0.2)
Basos: 0 %
EOS (ABSOLUTE): 0.2 x10E3/uL (ref 0.0–0.4)
Eos: 1 %
Hematocrit: 46.6 % (ref 34.0–46.6)
Hemoglobin: 14.7 g/dL (ref 11.1–15.9)
Immature Grans (Abs): 0 x10E3/uL (ref 0.0–0.1)
Immature Granulocytes: 0 %
Lymphocytes Absolute: 1.5 x10E3/uL (ref 0.7–3.1)
Lymphs: 13 %
MCH: 28.5 pg (ref 26.6–33.0)
MCHC: 31.5 g/dL (ref 31.5–35.7)
MCV: 91 fL (ref 79–97)
Monocytes Absolute: 0.5 x10E3/uL (ref 0.1–0.9)
Monocytes: 4 %
Neutrophils Absolute: 9.4 x10E3/uL — ABNORMAL HIGH (ref 1.4–7.0)
Neutrophils: 82 %
Platelets: 348 x10E3/uL (ref 150–450)
RBC: 5.15 x10E6/uL (ref 3.77–5.28)
RDW: 13.3 % (ref 11.7–15.4)
WBC: 11.6 x10E3/uL — ABNORMAL HIGH (ref 3.4–10.8)

## 2024-10-03 LAB — LIPID PANEL
Chol/HDL Ratio: 3.2 ratio (ref 0.0–4.4)
Cholesterol, Total: 136 mg/dL (ref 100–199)
HDL: 43 mg/dL (ref >39)
LDL Chol Calc (NIH): 69 mg/dL (ref 0–99)
Triglycerides: 135 mg/dL (ref 0–149)
VLDL Cholesterol Cal: 24 mg/dL (ref 5–40)

## 2024-10-03 LAB — HEMOGLOBIN A1C
Est. average glucose Bld gHb Est-mCnc: 126 mg/dL
Hgb A1c MFr Bld: 6 % — ABNORMAL HIGH (ref 4.8–5.6)

## 2024-10-03 LAB — VITAMIN D 25 HYDROXY (VIT D DEFICIENCY, FRACTURES): Vit D, 25-Hydroxy: 55.4 ng/mL (ref 30.0–100.0)

## 2024-10-03 LAB — TSH: TSH: 0.383 u[IU]/mL — ABNORMAL LOW (ref 0.450–4.500)

## 2024-10-04 ENCOUNTER — Ambulatory Visit: Admitting: Physician Assistant

## 2024-10-04 ENCOUNTER — Encounter: Payer: Self-pay | Admitting: Physician Assistant

## 2024-10-04 VITALS — BP 110/62 | HR 93 | Temp 98.0°F | Ht 68.0 in | Wt 278.0 lb

## 2024-10-04 DIAGNOSIS — Z23 Encounter for immunization: Secondary | ICD-10-CM | POA: Diagnosis not present

## 2024-10-04 DIAGNOSIS — R7303 Prediabetes: Secondary | ICD-10-CM

## 2024-10-04 DIAGNOSIS — E038 Other specified hypothyroidism: Secondary | ICD-10-CM

## 2024-10-04 DIAGNOSIS — K219 Gastro-esophageal reflux disease without esophagitis: Secondary | ICD-10-CM

## 2024-10-04 DIAGNOSIS — E782 Mixed hyperlipidemia: Secondary | ICD-10-CM | POA: Diagnosis not present

## 2024-10-04 DIAGNOSIS — R8290 Unspecified abnormal findings in urine: Secondary | ICD-10-CM

## 2024-10-04 DIAGNOSIS — E559 Vitamin D deficiency, unspecified: Secondary | ICD-10-CM | POA: Diagnosis not present

## 2024-10-04 DIAGNOSIS — I10 Essential (primary) hypertension: Secondary | ICD-10-CM

## 2024-10-04 DIAGNOSIS — F418 Other specified anxiety disorders: Secondary | ICD-10-CM

## 2024-10-04 NOTE — Progress Notes (Signed)
 Established Patient Office Visit  Subjective:  Patient ID: Kathleen [name], female    DOB: 01-03-1956  Age: 68 y.o. MRN: [id number]  CC:  Chief Complaint  Patient presents with   Medical Management of Chronic Issues    HPI Kathleen [name] presents for follow up of chronic medical problems -  Pt with hypertension and CHF - pt is currently taking norvasc  10mg , lasix  40mg , and zestril  40mg   Pt is overdue for follow up with cardiology and was referred at last visit -- she never made the appt but states they did call her and she will call them back to reschedule At this time she denies chest pain or dyspnea  Pt with  hypothyroidism - patient is currently taking synthroid  175mcg qd despite her labwork shows low TSH and was advised to decrease to - she thinks she could have been taking occasionally more than one per day - so will continue 175mcg qd for now and recheck TSH in 8 weeks  Pt has chronic pain in her right knee after having knee replacement surgery years ago - states she uses tramadol  prn for pain -  Pt with GERD - is taking prilosec 40 qd which controls her symptoms - denies any GERD symptoms  Pt has  anxiety with mild depression - she states she is doing well on her current meds of buspar  and cymbalta    Pt with hyperlipidemia and coronary artery disease - she is currently taking crestor  40mg  every day -   Pt with  Vit D deficiency -  - is taking weekly supplement Due for labwork  Pt with stress incontinence - takes ditropan  XL 10mg   Pt would like flu shot today  Pt complains of odor to urine but cannot get specimen today - will take cup and bring back sample in am  Past Medical History:  Diagnosis Date   Anxiety 12/28/2019   Arthritis    CAD in native artery 04/28/2016   Cancer (HCC)    Chronic back pain    Chronic pain of right knee 12/28/2019   Complication of anesthesia    Woke up during  Hysterectomy   Coronary artery disease 11/02/2010   Mild to  Moderate   Dyslipidemia 04/28/2016   Encounter for follow-up surveillance of thyroid  cancer 06/21/2018   Essential hypertension 04/28/2016   Family history of adverse reaction to anesthesia    GERD (gastroesophageal reflux disease)    omeprazole  prn   GERD without esophagitis 12/28/2019   Headache with remote history of traumatic head injury 12/28/2019   History of blood transfusion    Hx of thyroid  cancer    Hyperglycemia 06/19/2021   Hyperlipidemia    Hypertension    Hypothyroidism    Mixed hyperlipidemia    Morbid obesity (HCC) 12/28/2019   Murmur 08/03/2012   Nasal congestion 04/24/2021   Need for prophylactic vaccination and inoculation against influenza 09/24/2020   Other chest pain 04/24/2021   Other specified hypothyroidism 03/27/2020   Palpitations 08/03/2012   PONV (postoperative nausea and vomiting)    with thyroid  surgery   Preop cardiovascular exam 04/28/2016   Primary hypertension 06/19/2021   Primary osteoarthritis of knee 04/28/2016   Pulmonary hypertension, unspecified (HCC)    Retrocalcaneal bone spur 06/01/2017   Sleep apnea, obstructive    possible   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    KNEE ARTHROPLASTY Right 05/01/2016   Procedure:  TOTAL KNEE ARTHROPLASTY;  Surgeon: Oneil JAYSON Herald, MD;  Location: [facility] OR;  Service: Orthopedics;  Laterality: Right;   RIGHT/LEFT HEART CATH AND CORONARY ANGIOGRAPHY Bilateral 01/17/2018   Procedure: RIGHT/LEFT HEART CATH AND CORONARY ANGIOGRAPHY;  Surgeon: Darron Deatrice LABOR, MD;  Location: [facility] INVASIVE CV LAB;  Service: Cardiovascular;  Laterality:  Bilateral;   THYROIDECTOMY  2000    Family History  Problem Relation Age of Onset   Diabetes Mother     Social History   Socioeconomic History   Marital status: Divorced    Spouse name: Not on file   Number of children: Not on file   Years of education: Not on file   Highest education level: Associate degree: academic program  Occupational History   Not on file  Tobacco Use   Smoking status: Former    Current packs/day: 0.00    Average packs/day: 0.5 packs/day for 20.0 years (10.0 ttl pk-yrs)    Types: Cigarettes    Start date: 22    Quit date: 2014    Years since quitting: 11.9   Smokeless tobacco: Never   Tobacco comments:    quit 2012  Vaping Use   Vaping status: Never Used  Substance and Sexual Activity   Alcohol use: Not Currently    Alcohol/week: 1.0 standard drink of alcohol    Types: 1 Glasses of wine per week   Drug use: No   Sexual activity: Not Currently  Other Topics Concern   Not on file  Social History Narrative   Not on file   Social Drivers of Health   Financial Resource Strain: Low Risk  (08/09/2024)   Overall Financial Resource Strain (CARDIA)    Difficulty of Paying Living Expenses: Not hard at all  Food Insecurity: No Food Insecurity (08/09/2024)   Hunger Vital Sign    Worried About Running Out of Food in the Last Year: Never true    Ran Out of Food in the Last Year: Never true  Transportation Needs: No Transportation Needs (08/09/2024)   PRAPARE - Administrator, Civil Service (Medical): No    Lack of Transportation (Non-Medical): No  Physical Activity: Insufficiently Active (08/09/2024)   Exercise Vital Sign    Days of Exercise per Week: 1 day    Minutes of Exercise per Session: 10 min  Stress: Stress Concern Present (08/09/2024)   [employer] of Occupational Health - Occupational Stress Questionnaire    Feeling of Stress: To some extent  Social Connections: Moderately Isolated (08/09/2024)   Social Connection and  Isolation Panel    Frequency of Communication with Friends and Family: More than three times a week    Frequency of Social Gatherings with Friends and Family: More than three times a week    Attends Religious Services: Never    Database Administrator or Organizations: No    Attends Engineer, Structural: Not on file    Marital Status: Living with partner  Intimate Partner Violence: Not At Risk (10/12/2023)   Humiliation, Afraid, Rape, and Kick questionnaire    Fear of Current or Ex-Partner: No    Emotionally Abused: No    Physically  Abused: No    Sexually Abused: No     Current Outpatient Medications:    amLODipine  (NORVASC ) 10 MG tablet, TAKE 1 TABLET BY MOUTH DAILY, Disp: 100 tablet, Rfl: 2   aspirin  EC 81 MG tablet, Take 81 mg by mouth daily. Swallow whole., Disp: , Rfl:    busPIRone  (BUSPAR ) 7.5 MG tablet, TAKE 1 TABLET BY MOUTH TWICE  DAILY, Disp: 180 tablet, Rfl: 0   clotrimazole -betamethasone  (LOTRISONE ) cream, Apply 1 Application topically daily., Disp: 30 g, Rfl: 0   cyclobenzaprine  (FLEXERIL ) 5 MG tablet, TAKE 1 TABLET BY MOUTH AT  BEDTIME, Disp: 90 tablet, Rfl: 0   DULoxetine  (CYMBALTA ) 60 MG capsule, TAKE 1 CAPSULE BY MOUTH TWICE  DAILY, Disp: 180 capsule, Rfl: 0   furosemide  (LASIX ) 40 MG tablet, TAKE 1 TABLET BY MOUTH DAILY, Disp: 100 tablet, Rfl: 2   lisdexamfetamine (VYVANSE ) 30 MG capsule, Take 1 capsule (30 mg total) by mouth daily., Disp: 30 capsule, Rfl: 0   lisinopril  (ZESTRIL ) 40 MG tablet, TAKE 1 TABLET BY MOUTH DAILY, Disp: 100 tablet, Rfl: 2   montelukast  (SINGULAIR ) 10 MG tablet, TAKE 1 TABLET BY MOUTH AT  BEDTIME, Disp: 100 tablet, Rfl: 2   Multiple Vitamins-Minerals (ADULT GUMMY PO), Take 1 each by mouth daily. Delta 9 THC gummy, Disp: , Rfl:    omeprazole  (PRILOSEC) 40 MG capsule, TAKE 1 CAPSULE BY MOUTH TWICE  DAILY, Disp: 200 capsule, Rfl: 2   oxybutynin  (DITROPAN -XL) 10 MG 24 hr tablet, TAKE 1 TABLET BY MOUTH AT  BEDTIME, Disp: 100 tablet, Rfl: 2    rosuvastatin  (CRESTOR ) 40 MG tablet, TAKE 1 TABLET BY MOUTH DAILY, Disp: 100 tablet, Rfl: 2   SYNTHROID  175 MCG tablet, Take 1 tablet (175 mcg total) by mouth daily. NAME BRAND ONLY, Disp: 30 tablet, Rfl: 1   traMADol  (ULTRAM ) 50 MG tablet, Take 1 tablet (50 mg total) by mouth daily as needed., Disp: 30 tablet, Rfl: 0   Vitamin D , Ergocalciferol , (DRISDOL ) 1.25 MG (50000 UNIT) CAPS capsule, TAKE 1 CAPSULE BY MOUTH ONCE  WEEKLY, Disp: 15 capsule, Rfl: 0   Allergies  Allergen Reactions   Hydrocodone-Acetaminophen  Other (See Comments) and Nausea And Vomiting    Headache, allergy to hydrocodone   Latex Hives   Loracarbef Other (See Comments)   Penicillins Other (See Comments)    Syncope Has patient had a PCN reaction causing immediate rash, facial/tongue/throat swelling, SOB or lightheadedness with hypotension: Unknown Has patient had a PCN reaction causing severe rash involving mucus membranes or skin necrosis: No Has patient had a PCN reaction that required hospitalization: No Has patient had a PCN reaction occurring within the last 10 years: Unknown If all of the above answers are NO, then may proceed with Cephalosporin use.  Syncope Has patient had a PCN reaction causing immediate rash, facial/tongue/throat swelling, SOB or lightheadedness with hypotension: Unknown Has patient had a PCN reaction causing severe rash involving mucus membranes or skin necrosis: No Has patient had a PCN reaction that required hospitalization: No Has patient had a PCN reaction occurring within the last 10 years: Unknown If all of the above answers are NO, then may proceed with Cephalosporin use.   Hydrocodone-Acetaminophen  Other (See Comments)    Headache, allergy to hydrocodone   Prednisone Other (See Comments)    Crazy   CONSTITUTIONAL: Negative for chills, fatigue, fever, unintentional weight gain and unintentional weight loss.  E/N/T: Negative for ear pain, nasal congestion and sore throat.   CARDIOVASCULAR: Negative for chest pain, dizziness,  palpitations and pedal edema.  RESPIRATORY: Negative for recent cough and dyspnea.  GASTROINTESTINAL: Negative for abdominal pain, acid reflux symptoms, constipation, diarrhea, nausea and vomiting. GU - see HPI  MSK: Negative for arthralgias and myalgias.  INTEGUMENTARY: Negative for rash.  NEUROLOGICAL: Negative for dizziness and headaches.  PSYCHIATRIC: Negative for sleep disturbance and to question depression screen.  Negative for depression, negative for anhedonia.       Objective:  PHYSICAL EXAM:   VS: BP 110/62 (BP Location: Left Arm, Patient Position: Sitting)   Pulse 93   Temp 98 F (36.7 C) (Temporal)   Ht 5' 8 (1.727 m)   Wt 278 lb (126.1 kg)   SpO2 96%   BMI 42.27 kg/m   GEN: Well nourished, well developed, in no acute distress  Cardiac: RRR; no murmurs, rubs, or gallops,no edema -  Respiratory:  normal respiratory rate and pattern with no distress - normal breath sounds with no rales, rhonchi, wheezes or rubs MS: no deformity or atrophy  Skin: warm and dry, no rash  Neuro:  Alert and Oriented x 3,  - CN II-Xii grossly intact Psych: euthymic mood, appropriate affect and demeanor  Health Maintenance Due  Topic Date Due   Zoster Vaccines- Shingrix (1 of 2) Never done   Bone Density Scan  Never done   COVID-19 Vaccine (5 - 2025-26 season) 07/03/2024   Fecal DNA (Cologuard)  09/19/2024   Medicare Annual Wellness (AWV)  10/11/2024      There are no preventive care reminders to display for this patient.  Lab Results  Component Value Date   TSH 0.383 (L) 10/02/2024   Lab Results  Component Value Date   WBC 11.6 (H) 10/02/2024   HGB 14.7 10/02/2024   HCT 46.6 10/02/2024   MCV 91 10/02/2024   PLT 348 10/02/2024   Lab Results  Component Value Date   NA 139 10/02/2024   K 4.2 10/02/2024   CO2 21 10/02/2024   GLUCOSE 109 (H) 10/02/2024   BUN 17 10/02/2024   CREATININE 1.06 (H) 10/02/2024   BILITOT  0.4 10/02/2024   ALKPHOS 110 10/02/2024   AST 14 10/02/2024   ALT 12 10/02/2024   PROT 6.6 10/02/2024   ALBUMIN 4.1 10/02/2024   CALCIUM  10.6 (H) 10/02/2024   ANIONGAP 9 01/10/2018   EGFR 57 (L) 10/02/2024   Lab Results  Component Value Date   CHOL 136 10/02/2024   Lab Results  Component Value Date   HDL 43 10/02/2024   Lab Results  Component Value Date   LDLCALC 69 10/02/2024   Lab Results  Component Value Date   TRIG 135 10/02/2024   Lab Results  Component Value Date   CHOLHDL 3.2 10/02/2024   Lab Results  Component Value Date   HGBA1C 6.0 (H) 10/02/2024      Assessment & Plan:   Problem List Items Addressed This Visit       Cardiovascular and Mediastinum   Primary hypertension - Primary   Relevant Orders    Continue meds Labwork pending     Digestive   GERD (gastroesophageal reflux disease) Continue med     Endocrine   Other specified hypothyroidism   Relevant Medications   Continue current med Recheck TSH in 8 weeks      Musculoskeletal and Integument   Prediabetes   Continue to watch diet  GERD Continue med        Other   Mixed hyperlipidemia   Relevant Orders   Continue meds  Watch diet Lipid panel pending      Vitamin D  insufficiency   Relevant Medications   Vitamin D , Ergocalciferol , (DRISDOL ) 1.25 MG (50000 UNIT) CAPS capsule      Depression with anxiety Continue current medications      Need flu shot Fluzone Hi given  Bad odor to urine Pt to bring back ua tomorrow                       No orders of the defined types were placed in this encounter.    Follow-up: Return in about 6 months (around 04/04/2025) for chronic fasting follow-up - 8 weeks for cmp and TSH.    SARA R Karlea Mckibbin, PA-Ch

## 2024-10-06 ENCOUNTER — Ambulatory Visit

## 2024-10-06 ENCOUNTER — Other Ambulatory Visit: Payer: Self-pay | Admitting: Physician Assistant

## 2024-10-06 DIAGNOSIS — N3001 Acute cystitis with hematuria: Secondary | ICD-10-CM

## 2024-10-06 LAB — POCT URINALYSIS DIP (CLINITEK)
Bilirubin, UA: NEGATIVE
Glucose, UA: NEGATIVE mg/dL
Ketones, POC UA: NEGATIVE mg/dL
Nitrite, UA: POSITIVE — AB
POC PROTEIN,UA: NEGATIVE
Spec Grav, UA: 1.02 (ref 1.010–1.025)
Urobilinogen, UA: NEGATIVE U/dL — AB
pH, UA: 6 (ref 5.0–8.0)

## 2024-10-06 MED ORDER — NITROFURANTOIN MONOHYD MACRO 100 MG PO CAPS: 100.0000 mg | ORAL_CAPSULE | Freq: Two times a day (BID) | ORAL | 0 refills | Status: DC

## 2024-10-06 NOTE — Progress Notes (Signed)
 Patient is in office today for a nurse visit for UA. Patient UA was  positive for RBCs, positive for nitrates, and positive for leukocytes  Provider made aware of results, and she spoke with patient made her aware she will send urine off for culture and send in medication to treat UTI.

## 2024-10-10 ENCOUNTER — Ambulatory Visit: Payer: Self-pay | Admitting: Physician Assistant

## 2024-10-10 LAB — URINE CULTURE

## 2024-10-21 ENCOUNTER — Other Ambulatory Visit: Payer: Self-pay | Admitting: Physician Assistant

## 2024-10-21 DIAGNOSIS — F418 Other specified anxiety disorders: Secondary | ICD-10-CM

## 2024-11-14 ENCOUNTER — Other Ambulatory Visit: Payer: Self-pay | Admitting: Physician Assistant

## 2024-11-14 DIAGNOSIS — E559 Vitamin D deficiency, unspecified: Secondary | ICD-10-CM

## 2024-11-14 DIAGNOSIS — F418 Other specified anxiety disorders: Secondary | ICD-10-CM

## 2024-11-14 DIAGNOSIS — M542 Cervicalgia: Secondary | ICD-10-CM

## 2024-11-21 ENCOUNTER — Other Ambulatory Visit: Payer: Self-pay

## 2024-11-21 DIAGNOSIS — R899 Unspecified abnormal finding in specimens from other organs, systems and tissues: Secondary | ICD-10-CM

## 2024-11-28 ENCOUNTER — Ambulatory Visit

## 2024-11-28 ENCOUNTER — Other Ambulatory Visit

## 2024-11-30 ENCOUNTER — Encounter: Payer: Self-pay | Admitting: Physician Assistant

## 2024-11-30 ENCOUNTER — Ambulatory Visit: Payer: Self-pay

## 2024-11-30 NOTE — Telephone Encounter (Signed)
 Please call pt and make appt to be seen

## 2024-11-30 NOTE — Telephone Encounter (Signed)
 FYI Only or Action Required?: FYI only for provider: refused appt with providers, only willing to see PCP.  Patient was last seen in primary care on 10/04/2024 by Nicholaus Credit, PA-C.  Called Nurse Triage reporting Cough.  Symptoms began several days ago.  Interventions attempted: Rest, hydration, or home remedies.  Symptoms are: gradually worsening.  Triage Disposition: See Physician Within 24 Hours  Patient/caregiver understands and will follow disposition?: No, wishes to speak with PCP  Reason for Triage:  Crazy - sick for 6 fays and cough is horrific. No fever at this moment. been having fevers off and on. tired, weak, hacking sneezing. dont wann get outta bed.   nice, light shade of greenish, yellow. alot of drainage. coming from chest and head. mainly chest.   televisit.  Reason for Disposition  SEVERE coughing spells (e.g., whooping sound after coughing, vomiting after coughing)  Answer Assessment - Initial Assessment Questions 1. ONSET: When did the cough begin?      6 days 2. SEVERITY: How bad is the cough today?      severe 3. SPUTUM: Describe the color of your sputum (e.g., none, dry cough; clear, white, yellow, green)     Green/yellow 4. HEMOPTYSIS: Are you coughing up any blood? If Yes, ask: How much? (e.g., flecks, streaks, tablespoons, etc.)     denies 5. DIFFICULTY BREATHING: Are you having difficulty breathing? If Yes, ask: How bad is it? (e.g., mild, moderate, severe)     At times its a little tight 6. FEVER: Do you have a fever? If Yes, ask: What is your temperature, how was it measured, and when did it start?     Not today  Pt offered appt with providers in PCP office, pt refused. Pt advises that she will not come to clinic, requesting telehealth.  Protocols used: Cough - Acute Productive-A-AH

## 2024-11-30 NOTE — Telephone Encounter (Signed)
 Called patient and she denied visit, she would only like to see camie Moats, she stated her friend is coming today and will check her lungs and she will send Camie Moats a my chart visit and let her know what she say.

## 2024-12-01 ENCOUNTER — Encounter: Payer: Self-pay | Admitting: Physician Assistant

## 2024-12-01 ENCOUNTER — Ambulatory Visit: Admitting: Physician Assistant

## 2024-12-01 VITALS — BP 130/98 | HR 57 | Temp 98.3°F | Resp 18 | Ht 68.0 in | Wt 278.6 lb

## 2024-12-01 DIAGNOSIS — M25561 Pain in right knee: Secondary | ICD-10-CM | POA: Diagnosis not present

## 2024-12-01 DIAGNOSIS — G8929 Other chronic pain: Secondary | ICD-10-CM

## 2024-12-01 DIAGNOSIS — I10 Essential (primary) hypertension: Secondary | ICD-10-CM

## 2024-12-01 DIAGNOSIS — J069 Acute upper respiratory infection, unspecified: Secondary | ICD-10-CM

## 2024-12-01 LAB — POC COVID19 BINAXNOW: SARS Coronavirus 2 Ag: NEGATIVE

## 2024-12-01 LAB — POCT FLU A/B STATUS
Influenza A, POC: NEGATIVE
Influenza B, POC: NEGATIVE

## 2024-12-01 MED ORDER — TRAMADOL HCL 50 MG PO TABS: 50.0000 mg | ORAL_TABLET | Freq: Every day | ORAL | 0 refills | Status: AC | PRN

## 2024-12-01 MED ORDER — DOXYCYCLINE HYCLATE 100 MG PO TABS: 100.0000 mg | ORAL_TABLET | Freq: Two times a day (BID) | ORAL | 0 refills | Status: AC

## 2024-12-01 NOTE — Assessment & Plan Note (Signed)
 Pt had missed several doses of medication Recommend to take meds as directed

## 2024-12-01 NOTE — Progress Notes (Signed)
 "  Acute Office Visit  Subjective:    Patient ID: Kathleen [name], female    DOB: 1956/09/29, 69 y.o.   MRN: [id number]  Chief Complaint  Patient presents with   Cough   Nasal Congestion   Fatigue    HPI: Patient is in today for complaints of cough, congestion and malaise since last week.  Cough has been productive.  She states earlier in week she did have a fever.  Has had some sinus congestion and pressure also Denies chest pain or dyspnea  Current Medications[1]  Allergies[2]  ROS CONSTITUTIONAL: see HPI E/N/T: see HPI CARDIOVASCULAR: Negative for chest pain,   RESPIRATORY: see HPI GASTROINTESTINAL: Negative for abdominal pain, acid reflux symptoms, constipation, diarrhea, nausea and vomiting.      Objective:    PHYSICAL EXAM:   BP (!) 130/98   Pulse (!) 57   Temp 98.3 F (36.8 C) (Temporal)   Resp 18   Ht 5' 8 (1.727 m)   Wt 278 lb 9.6 oz (126.4 kg)   SpO2 99%   BMI 42.36 kg/m    GEN: Well nourished, well developed, in no acute distress  HEENT: normal external ears and nose - normal external auditory canals and TMS -  - Lips, Teeth and Gums - normal  Oropharynx - erythema/pnd Cardiac: RRR; no murmurs,  Respiratory:  faint rhonchi noted - clears with cough  Office Visit on 12/01/2024  Component Date Value Ref Range Status   SARS Coronavirus 2 Ag 12/01/2024 Negative  Negative Final   Influenza A, POC 12/01/2024 Negative  Negative Final   Influenza B, POC 12/01/2024 Negative  Negative Final         Assessment & Plan Acute upper respiratory infection Continue delsym / mucinex for cough and congestion Orders:   POC COVID-19   POCT Flu A & B Status   doxycycline  (VIBRA -TABS) 100 MG tablet; Take 1 tablet (100 mg total) by mouth 2 (two) times daily.  Chronic pain of right knee Pt requested refill of tramadol  for chronic knee pain Orders:   traMADol  (ULTRAM ) 50 MG tablet; Take 1 tablet (50 mg total) by mouth daily as needed.  Primary  hypertension Pt had missed several doses of medication Recommend to take meds as directed      Follow-up: Return if symptoms worsen or fail to improve, for nurse visit BP check - 2 weeks.  An After Visit Summary was printed and given to the patient.  SARA R Aser Nylund, PA-C [facility] [phone]     [1]  Current Outpatient Medications:    amLODipine  (NORVASC ) 10 MG tablet, TAKE 1 TABLET BY MOUTH DAILY, Disp: 100 tablet, Rfl: 2   aspirin  EC 81 MG tablet, Take 81 mg by mouth daily. Swallow whole., Disp: , Rfl:    busPIRone  (BUSPAR ) 7.5 MG tablet, TAKE 1 TABLET BY MOUTH TWICE  DAILY, Disp: 180 tablet, Rfl: 0   clotrimazole -betamethasone  (LOTRISONE ) cream, Apply 1 Application topically daily., Disp: 30 g, Rfl: 0   cyclobenzaprine  (FLEXERIL ) 5 MG tablet, TAKE 1 TABLET BY MOUTH AT  BEDTIME, Disp: 90 tablet, Rfl: 0   diazepam (VALIUM) 5 MG tablet, SMARTSIG:1 Tablet(s) By Mouth, Disp: , Rfl:    doxycycline  (VIBRA -TABS) 100 MG tablet, Take 1 tablet (100 mg total) by mouth 2 (two) times daily., Disp: 20 tablet, Rfl: 0   DULoxetine  (CYMBALTA ) 60 MG capsule, TAKE 1 CAPSULE BY MOUTH TWICE  DAILY, Disp: 180 capsule, Rfl: 1   furosemide  (LASIX ) 40 MG tablet,  TAKE 1 TABLET BY MOUTH DAILY, Disp: 100 tablet, Rfl: 2   lisinopril  (ZESTRIL ) 40 MG tablet, TAKE 1 TABLET BY MOUTH DAILY, Disp: 100 tablet, Rfl: 2   montelukast  (SINGULAIR ) 10 MG tablet, TAKE 1 TABLET BY MOUTH AT  BEDTIME, Disp: 100 tablet, Rfl: 2   Multiple Vitamins-Minerals (ADULT GUMMY PO), Take 1 each by mouth daily. Delta 9 THC gummy, Disp: , Rfl:    omeprazole  (PRILOSEC) 40 MG capsule, TAKE 1 CAPSULE BY MOUTH TWICE  DAILY, Disp: 200 capsule, Rfl: 2   oxybutynin  (DITROPAN -XL) 10 MG 24 hr tablet, TAKE 1 TABLET BY MOUTH AT  BEDTIME, Disp: 100 tablet, Rfl: 2   rosuvastatin  (CRESTOR ) 40 MG tablet, TAKE 1 TABLET BY MOUTH DAILY, Disp: 100 tablet, Rfl: 2   SYNTHROID  175 MCG tablet, Take 1 tablet (175 mcg total) by mouth daily. NAME BRAND  ONLY, Disp: 30 tablet, Rfl: 1   Vitamin D , Ergocalciferol , (DRISDOL ) 1.25 MG (50000 UNIT) CAPS capsule, TAKE 1 CAPSULE BY MOUTH ONCE  WEEKLY, Disp: 15 capsule, Rfl: 2   traMADol  (ULTRAM ) 50 MG tablet, Take 1 tablet (50 mg total) by mouth daily as needed., Disp: 30 tablet, Rfl: 0 [2]  Allergies Allergen Reactions   Hydrocodone-Acetaminophen  Other (See Comments) and Nausea And Vomiting    Headache, allergy to hydrocodone   Latex Hives   Loracarbef Other (See Comments)   Penicillins Other (See Comments)    Syncope Has patient had a PCN reaction causing immediate rash, facial/tongue/throat swelling, SOB or lightheadedness with hypotension: Unknown Has patient had a PCN reaction causing severe rash involving mucus membranes or skin necrosis: No Has patient had a PCN reaction that required hospitalization: No Has patient had a PCN reaction occurring within the last 10 years: Unknown If all of the above answers are NO, then may proceed with Cephalosporin use.  Syncope Has patient had a PCN reaction causing immediate rash, facial/tongue/throat swelling, SOB or lightheadedness with hypotension: Unknown Has patient had a PCN reaction causing severe rash involving mucus membranes or skin necrosis: No Has patient had a PCN reaction that required hospitalization: No Has patient had a PCN reaction occurring within the last 10 years: Unknown If all of the above answers are NO, then may proceed with Cephalosporin use.   Hydrocodone-Acetaminophen  Other (See Comments)    Headache, allergy to hydrocodone   Prednisone Other (See Comments)    Crazy   "

## 2024-12-01 NOTE — Assessment & Plan Note (Signed)
 Pt requested refill of tramadol  for chronic knee pain Orders:   traMADol  (ULTRAM ) 50 MG tablet; Take 1 tablet (50 mg total) by mouth daily as needed.

## 2024-12-07 ENCOUNTER — Ambulatory Visit

## 2024-12-07 VITALS — BP 138/76 | Ht 68.0 in | Wt 275.0 lb

## 2024-12-07 DIAGNOSIS — Z0000 Encounter for general adult medical examination without abnormal findings: Secondary | ICD-10-CM

## 2024-12-07 NOTE — Patient Instructions (Signed)
 Kathleen [name],  Thank you for taking the time for your Medicare Wellness Visit. I appreciate your continued commitment to your health goals. Please review the care plan we discussed, and feel free to reach out if I can assist you further.  Please note that Annual Wellness Visits do not include a physical exam. Some assessments may be limited, especially if the visit was conducted virtually. If needed, we may recommend an in-person follow-up with your provider.  Ongoing Care Seeing your primary care provider every 3 to 6 months helps us  monitor your health and provide consistent, personalized care.   Referrals If a referral was made during today's visit and you haven't received any updates within two weeks, please contact the referred provider directly to check on the status.  Recommended Screenings:  Health Maintenance  Topic Date Due   COVID-19 Vaccine (5 - 2025-26 season) 07/03/2024   Medicare Annual Wellness Visit  10/11/2024   Breast Cancer Screening  01/24/2025   Zoster (Shingles) Vaccine (1 of 2) 01/02/2025*   DTaP/Tdap/Td vaccine (1 - Tdap) 01/19/2025*   Osteoporosis screening with Bone Density Scan  10/04/2025*   Cologuard (Stool DNA test)  10/04/2025*   Pneumococcal Vaccine for age over 65  Completed   Flu Shot  Completed   Meningitis B Vaccine  Aged Out   Hepatitis C Screening  Discontinued  *Topic was postponed. The date shown is not the original due date.       10/12/2023    9:09 AM  Advanced Directives  Does Patient Have a Medical Advance Directive? Yes  Type of Advance Directive Living will;Healthcare Power of Attorney  Does patient want to make changes to medical advance directive? No - Patient declined  Would patient like information on creating a medical advance directive? No - Patient declined    Vision: Annual vision screenings are recommended for early detection of glaucoma, cataracts, and diabetic retinopathy. These exams can also reveal signs of chronic  conditions such as diabetes and high blood pressure.  Dental: Annual dental screenings help detect early signs of oral cancer, gum disease, and other conditions linked to overall health, including heart disease and diabetes.  Please see the attached documents for additional preventive care recommendations.

## 2024-12-07 NOTE — Progress Notes (Signed)
 " I connected with  Kathleen [name] on 12/07/24 by a video and audio enabled telemedicine application and verified that I am speaking with the correct person using two identifiers.  Patient Location: Home  Provider Location: Home Office  Persons Participating in Visit: Patient.  I discussed the limitations of evaluation and management by telemedicine. The patient expressed understanding and agreed to proceed.  Vital Signs: Because this visit was a virtual/telehealth visit, some criteria may be missing or patient reported. Any vitals not documented were not able to be obtained and vitals that have been documented are patient reported.  Chief Complaint  Patient presents with   Medicare Wellness     Subjective:   Kathleen [name] is a 69 y.o. female who presents for a Medicare Annual Wellness Visit.  Visit info / Clinical Intake: Medicare Wellness Visit Type:: Subsequent Annual Wellness Visit Persons participating in visit and providing information:: patient Medicare Wellness Visit Mode:: Video Since this visit was completed virtually, some vitals may be partially provided or unavailable. Missing vitals are due to the limitations of the virtual format.: Documented vitals are patient reported If Telephone or Video please confirm:: I connected with patient using audio/video enable telemedicine. I verified patient identity with two identifiers, discussed telehealth limitations, and patient agreed to proceed. Patient Location:: home Provider Location:: home office Interpreter Needed?: No Pre-visit prep was completed: yes AWV questionnaire completed by patient prior to visit?: no Living arrangements:: lives with spouse/significant other Patient's Overall Health Status Rating: very good Typical amount of pain: some Does pain affect daily life?: no Are you currently prescribed opioids?: no  Dietary Habits and Nutritional Risks How many meals a day?: 2 Eats fruit and vegetables daily?:  yes Most meals are obtained by: preparing own meals In the last 2 weeks, have you had any of the following?: none Diabetic:: no  Functional Status Activities of Daily Living (to include ambulation/medication): Independent Ambulation: Independent with device- listed below Home Assistive Devices/Equipment: Cane Medication Administration: Independent Home Management (perform basic housework or laundry): Independent Manage your own finances?: yes Primary transportation is: driving Concerns about vision?: no *vision screening is required for WTM* Concerns about hearing?: no  Fall Screening Falls in the past year?: 1 Number of falls in past year: 0 Was there an injury with Fall?: 0 Fall Risk Category Calculator: 1 Patient Fall Risk Level: Low Fall Risk  Fall Risk Patient at Risk for Falls Due to: History of fall(s) Fall risk Follow up: Falls evaluation completed; Falls prevention discussed  Home and Transportation Safety: All rugs have non-skid backing?: yes All stairs or steps have railings?: yes Grab bars in the bathtub or shower?: yes Have non-skid surface in bathtub or shower?: yes Good home lighting?: yes Regular seat belt use?: yes Hospital stays in the last year:: no  Cognitive Assessment Difficulty concentrating, remembering, or making decisions? : yes Will 6CIT or Mini Cog be Completed: yes What year is it?: 0 points What month is it?: 0 points Give patient an address phrase to remember (5 components): [address] [place] About what time is it?: 0 points Count backwards from 20 to 1: 0 points Say the months of the year in reverse: 0 points Repeat the address phrase from earlier: 0 points 6 CIT Score: 0 points  Advance Directives (For Healthcare) Does Patient Have a Medical Advance Directive?: Yes Does patient want to make changes to medical advance directive?: No - Patient declined Type of Advance Directive: Healthcare Power of [place]; Living  will Copy  of Healthcare Power of Attorney in Chart?: No - copy requested Copy of Living Will in Chart?: No - copy requested  Reviewed/Updated  Reviewed/Updated: Reviewed All (Medical, Surgical, Family, Medications, Allergies, Care Teams, Patient Goals)    Allergies (verified) Hydrocodone-acetaminophen , Latex, Loracarbef, Penicillins, Hydrocodone-acetaminophen , and Prednisone   Current Medications (verified) Outpatient Encounter Medications as of 12/07/2024  Medication Sig   amLODipine  (NORVASC ) 10 MG tablet TAKE 1 TABLET BY MOUTH DAILY   aspirin  EC 81 MG tablet Take 81 mg by mouth daily. Swallow whole.   busPIRone  (BUSPAR ) 7.5 MG tablet TAKE 1 TABLET BY MOUTH TWICE  DAILY   clotrimazole -betamethasone  (LOTRISONE ) cream Apply 1 Application topically daily.   cyclobenzaprine  (FLEXERIL ) 5 MG tablet TAKE 1 TABLET BY MOUTH AT  BEDTIME   diazepam (VALIUM) 5 MG tablet SMARTSIG:1 Tablet(s) By Mouth   doxycycline  (VIBRA -TABS) 100 MG tablet Take 1 tablet (100 mg total) by mouth 2 (two) times daily.   DULoxetine  (CYMBALTA ) 60 MG capsule TAKE 1 CAPSULE BY MOUTH TWICE  DAILY   furosemide  (LASIX ) 40 MG tablet TAKE 1 TABLET BY MOUTH DAILY   lisinopril  (ZESTRIL ) 40 MG tablet TAKE 1 TABLET BY MOUTH DAILY   montelukast  (SINGULAIR ) 10 MG tablet TAKE 1 TABLET BY MOUTH AT  BEDTIME   Multiple Vitamins-Minerals (ADULT GUMMY PO) Take 1 each by mouth daily. Delta 9 THC gummy   omeprazole  (PRILOSEC) 40 MG capsule TAKE 1 CAPSULE BY MOUTH TWICE  DAILY   oxybutynin  (DITROPAN -XL) 10 MG 24 hr tablet TAKE 1 TABLET BY MOUTH AT  BEDTIME   rosuvastatin  (CRESTOR ) 40 MG tablet TAKE 1 TABLET BY MOUTH DAILY   SYNTHROID  175 MCG tablet Take 1 tablet (175 mcg total) by mouth daily. NAME BRAND ONLY   traMADol  (ULTRAM ) 50 MG tablet Take 1 tablet (50 mg total) by mouth daily as needed.   Vitamin D , Ergocalciferol , (DRISDOL ) 1.25 MG (50000 UNIT) CAPS capsule TAKE 1 CAPSULE BY MOUTH ONCE  WEEKLY   No facility-administered encounter  medications on file as of 12/07/2024.    History: Past Medical History:  Diagnosis Date   Anxiety 12/28/2019   Arthritis    CAD in native artery 04/28/2016   Cancer (HCC)    Chronic back pain    Chronic pain of [name] knee 12/28/2019   Complication of anesthesia    Woke up during  Hysterectomy   Coronary artery disease 11/02/2010   Mild to Moderate   Dyslipidemia 04/28/2016   Encounter for follow-up surveillance of thyroid  cancer 06/21/2018   Essential hypertension 04/28/2016   Family history of adverse reaction to anesthesia    GERD (gastroesophageal reflux disease)    omeprazole  prn   GERD without esophagitis 12/28/2019   Headache with remote history of traumatic head injury 12/28/2019   History of blood transfusion    Hx of thyroid  cancer    Hyperglycemia 06/19/2021   Hyperlipidemia    Hypertension    Hypothyroidism    Mixed hyperlipidemia    Morbid obesity (HCC) 12/28/2019   Murmur 08/03/2012   Nasal congestion 04/24/2021   Need for prophylactic vaccination and inoculation against influenza 09/24/2020   Other chest pain 04/24/2021   Other specified hypothyroidism 03/27/2020   Palpitations 08/03/2012   PONV (postoperative nausea and vomiting)    with thyroid  surgery   Preop cardiovascular exam 04/28/2016   Primary hypertension 06/19/2021   Primary osteoarthritis of knee 04/28/2016   Pulmonary hypertension, unspecified (HCC)    Retrocalcaneal bone spur 06/01/2017   Sleep apnea, obstructive  possible   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    KNEE ARTHROPLASTY [name] 05/01/2016   Procedure:   TOTAL KNEE ARTHROPLASTY;  Surgeon: Oneil JAYSON Herald, MD;  Location: [facility] OR;  Service: Orthopedics;  Laterality: [name];   [name]/LEFT HEART CATH AND CORONARY ANGIOGRAPHY Bilateral 01/17/2018   Procedure: [name]/LEFT HEART CATH AND CORONARY ANGIOGRAPHY;  Surgeon: Darron Deatrice LABOR, MD;  Location: [facility] INVASIVE CV LAB;  Service: Cardiovascular;  Laterality: Bilateral;   THYROIDECTOMY  2000   Family History  Problem Relation Age of Onset   Diabetes Mother    Social History   Occupational History   Not on file  Tobacco Use   Smoking status: Former    Current packs/day: 0.00    Average packs/day: 0.5 packs/day for 20.0 years (10.0 ttl pk-yrs)    Types: Cigarettes    Start date: 42    Quit date: 2014    Years since quitting: 12.1   Smokeless tobacco: Never   Tobacco comments:    quit 2012  Vaping Use   Vaping status: Never Used  Substance and Sexual Activity   Alcohol use: Not Currently    Alcohol/week: 1.0 standard drink of alcohol    Types: 1 Glasses of wine per week   Drug use: No   Sexual activity: Not Currently   Tobacco Counseling Counseling given: Not Answered Tobacco comments: quit 2012  SDOH Screenings   Food Insecurity: No Food Insecurity (12/07/2024)  Housing: Low Risk (12/07/2024)  Transportation Needs: No Transportation Needs (12/07/2024)  Utilities: Not At Risk (12/07/2024)  Alcohol Screen: Low Risk (10/12/2023)  Depression (PHQ2-9): Low Risk (12/07/2024)  Financial Resource Strain: Low Risk (08/09/2024)  Physical Activity: Inactive (12/07/2024)  Social Connections: Moderately Integrated (12/07/2024)  Stress: No Stress Concern Present (12/07/2024)  Tobacco Use: Medium Risk (12/07/2024)  Health Literacy: Adequate Health Literacy (12/07/2024)   See flowsheets for full screening details  Depression Screen PHQ 2 & 9 Depression Scale- Over the past 2 weeks, how often have you been bothered by any of the following problems? Little interest or pleasure in doing things: 1 Feeling down,  depressed, or hopeless (PHQ Adolescent also includes...irritable): 1 PHQ-2 Total Score: 2 Trouble falling or staying asleep, or sleeping too much: 0 Feeling tired or having little energy: 1 Poor appetite or overeating (PHQ Adolescent also includes...weight loss): 0 Feeling bad about yourself - or that you are a failure or have let yourself or your family down: 1 Trouble concentrating on things, such as reading the newspaper or watching television (PHQ Adolescent also includes...like school work): 0 Moving or speaking so slowly that other people could have noticed. Or the opposite - being so fidgety or restless that you have been moving around a lot more than usual: 0 Thoughts that you would be better off dead, or of hurting yourself in some way: 0 PHQ-9 Total Score: 4 If you checked off any problems, how difficult have these problems made it for you to do your work, take care of things at home, or get along with other people?: Somewhat difficult  Depression Treatment Depression Interventions/Treatment : Counseling  Goals Addressed               This Visit's Progress     lose 50 lbs (pt-stated)               Objective:    Today's Vitals   12/07/24 1100  BP: 138/76  Weight: 275 lb (124.7 kg)  Height: 5' 8 (1.727 m)   Body mass index is 41.81 kg/m.  Hearing/Vision screen Hearing Screening - Comments:: Some difficulties  Vision Screening - Comments:: Patient wears glasses and see Kathleen [name] in [place], [place] Immunizations and Health Maintenance Health Maintenance  Topic Date Due   COVID-19 Vaccine (5 - 2025-26 season) 07/03/2024   Medicare Annual Wellness (AWV)  10/11/2024   Mammogram  01/24/2025   Zoster Vaccines- Shingrix (1 of 2) 01/02/2025 (Originally 06/05/1975)   DTaP/Tdap/Td (1 - Tdap) 01/19/2025 (Originally 06[birth year]05/1975)   Bone Density Scan  10/04/2025 (Originally 06/04/2021)   Fecal DNA (Cologuard)  10/04/2025 (Originally 09/19/2024)   Pneumococcal Vaccine:  50+ Years  Completed   Influenza Vaccine  Completed   Meningococcal B Vaccine  Aged Out   Hepatitis C Screening  Discontinued        Assessment/Plan:  This is a routine wellness examination for Kathleen [name].  Patient Care Team: Nicholaus Credit, DEVONNA as PCP - General (Physician Assistant)  I have personally reviewed and noted the following in the patients chart:   Medical and social history Use of alcohol, tobacco or illicit drugs  Current medications and supplements including opioid prescriptions. Functional ability and status Nutritional status Physical activity Advanced directives List of other physicians Hospitalizations, surgeries, and ER visits in previous 12 months Vitals Screenings to include cognitive, depression, and falls Referrals and appointments  No orders of the defined types were placed in this encounter.  In addition, I have reviewed and discussed with patient certain preventive protocols, quality metrics, and best practice recommendations. A written personalized care plan for preventive services as well as general preventive health recommendations were provided to patient.   Kathleen [name], [place]   12/07/2024   No follow-ups on file.  After Visit Summary: (MyChart) Due to this being a telephonic visit, the after visit summary with patients personalized plan was offered to patient via MyChart   Nurse Notes: no voiced concenrs. Patient declined covid vaccination "

## 2024-12-15 ENCOUNTER — Ambulatory Visit

## 2025-04-12 ENCOUNTER — Ambulatory Visit: Admitting: Physician Assistant
